# Patient Record
Sex: Male | Born: 1985 | Hispanic: No | Marital: Single | Smoking: Former smoker
Health system: Southern US, Community
[De-identification: ages and names within clinical notes are randomized; demographics above are authoritative.]

## PROBLEM LIST (undated history)

## (undated) DIAGNOSIS — Z9289 Personal history of other medical treatment: Secondary | ICD-10-CM

## (undated) DIAGNOSIS — G8220 Paraplegia, unspecified: Secondary | ICD-10-CM

## (undated) DIAGNOSIS — C7490 Malignant neoplasm of unspecified part of unspecified adrenal gland: Secondary | ICD-10-CM

## (undated) DIAGNOSIS — J189 Pneumonia, unspecified organism: Secondary | ICD-10-CM

## (undated) DIAGNOSIS — R32 Unspecified urinary incontinence: Secondary | ICD-10-CM

## (undated) DIAGNOSIS — L709 Acne, unspecified: Secondary | ICD-10-CM

## (undated) DIAGNOSIS — N319 Neuromuscular dysfunction of bladder, unspecified: Secondary | ICD-10-CM

## (undated) DIAGNOSIS — R000 Tachycardia, unspecified: Secondary | ICD-10-CM

## (undated) DIAGNOSIS — R159 Full incontinence of feces: Secondary | ICD-10-CM

## (undated) DIAGNOSIS — D649 Anemia, unspecified: Secondary | ICD-10-CM

## (undated) HISTORY — PX: SPINE SURGERY: SHX786

## (undated) HISTORY — DX: Neuromuscular dysfunction of bladder, unspecified: N31.9

## (undated) HISTORY — DX: Acne, unspecified: L70.9

## (undated) HISTORY — DX: Paraplegia, unspecified: G82.20

## (undated) HISTORY — DX: Malignant neoplasm of unspecified part of unspecified adrenal gland: C74.90

---
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
## 2005-10-05 ENCOUNTER — Ambulatory Visit: Payer: Self-pay | Admitting: Family Medicine

## 2007-04-07 ENCOUNTER — Encounter (INDEPENDENT_AMBULATORY_CARE_PROVIDER_SITE_OTHER): Payer: Self-pay | Admitting: Family Medicine

## 2007-04-08 ENCOUNTER — Ambulatory Visit: Payer: Self-pay | Admitting: Family Medicine

## 2007-04-08 DIAGNOSIS — G8220 Paraplegia, unspecified: Secondary | ICD-10-CM | POA: Insufficient documentation

## 2007-04-08 DIAGNOSIS — N319 Neuromuscular dysfunction of bladder, unspecified: Secondary | ICD-10-CM | POA: Insufficient documentation

## 2007-04-08 DIAGNOSIS — L732 Hidradenitis suppurativa: Secondary | ICD-10-CM | POA: Insufficient documentation

## 2007-04-08 DIAGNOSIS — C7490 Malignant neoplasm of unspecified part of unspecified adrenal gland: Secondary | ICD-10-CM | POA: Insufficient documentation

## 2007-04-08 DIAGNOSIS — J45909 Unspecified asthma, uncomplicated: Secondary | ICD-10-CM

## 2007-04-09 ENCOUNTER — Telehealth (INDEPENDENT_AMBULATORY_CARE_PROVIDER_SITE_OTHER): Payer: Self-pay | Admitting: *Deleted

## 2007-08-11 ENCOUNTER — Telehealth (INDEPENDENT_AMBULATORY_CARE_PROVIDER_SITE_OTHER): Payer: Self-pay | Admitting: *Deleted

## 2008-10-19 ENCOUNTER — Ambulatory Visit: Payer: Self-pay | Admitting: Internal Medicine

## 2009-11-02 ENCOUNTER — Ambulatory Visit: Payer: Self-pay | Admitting: Internal Medicine

## 2009-11-07 LAB — CONVERTED CEMR LAB
AST: 23 units/L (ref 0–37)
Albumin: 3.9 g/dL (ref 3.5–5.2)
Alkaline Phosphatase: 71 units/L (ref 39–117)
Basophils Absolute: 0 10*3/uL (ref 0.0–0.1)
Basophils Relative: 0.5 % (ref 0.0–3.0)
CO2: 25 meq/L (ref 19–32)
Calcium: 9.3 mg/dL (ref 8.4–10.5)
Chloride: 106 meq/L (ref 96–112)
Eosinophils Absolute: 0.1 10*3/uL (ref 0.0–0.7)
Glucose, Bld: 79 mg/dL (ref 70–99)
HCT: 43.1 % (ref 39.0–52.0)
Hemoglobin: 14.3 g/dL (ref 13.0–17.0)
Lymphocytes Relative: 25 % (ref 12.0–46.0)
Lymphs Abs: 2.1 10*3/uL (ref 0.7–4.0)
MCHC: 33.1 g/dL (ref 30.0–36.0)
Neutro Abs: 5.8 10*3/uL (ref 1.4–7.7)
Potassium: 4.3 meq/L (ref 3.5–5.1)
RBC: 5.2 M/uL (ref 4.22–5.81)
RDW: 13.9 % (ref 11.5–14.6)
Sodium: 137 meq/L (ref 135–145)
TSH: 1.39 microintl units/mL (ref 0.35–5.50)
Total Protein: 7.2 g/dL (ref 6.0–8.3)

## 2009-11-18 ENCOUNTER — Telehealth (INDEPENDENT_AMBULATORY_CARE_PROVIDER_SITE_OTHER): Payer: Self-pay | Admitting: *Deleted

## 2009-11-30 ENCOUNTER — Encounter: Payer: Self-pay | Admitting: Internal Medicine

## 2009-12-28 ENCOUNTER — Telehealth (INDEPENDENT_AMBULATORY_CARE_PROVIDER_SITE_OTHER): Payer: Self-pay | Admitting: *Deleted

## 2010-03-08 ENCOUNTER — Telehealth (INDEPENDENT_AMBULATORY_CARE_PROVIDER_SITE_OTHER): Payer: Self-pay | Admitting: *Deleted

## 2010-04-11 NOTE — Progress Notes (Signed)
Summary: QUESTION ABOUT [place] ACCESS AND W/C REPAIR  Phone Note From Other Clinic   Caller: [facility] Summary of Call: RECEIVED PAPERWORK FROM [facility] REGARDING WHEELCHAIR REPAIRS--PAPER REFERENCES HIS MEDICAID TO BE [place] ACCESS AND WE DONT PARTICIPATE WITH [place] ACCESS, ONLY MEDICAID----HE DOES HAVE BCBS AS PRIMARY  OUR VERIFICATION SYSTEM DOES LIST DR Clyda Greener AS DOCTOR FOR HIS [place] ACCESS  WILL THIS MAKE ANY DIFFERENCE FOR HIS WHEELCHAIR REPAIR??    WILL TAKE PAPERWORK TO Duwayne Heck

## 2010-04-11 NOTE — Assessment & Plan Note (Signed)
Summary: CPX //lch   Vital Signs:  Patient profile:   25 year old male Height:      63 inches Weight:      120 pounds BMI:     21.33 Pulse rate:   104 / minute Pulse rhythm:   regular BP sitting:   122 / 80  (left arm) Cuff size:   regular  Vitals Entered By: Army Fossa CMA (November 02, 2009 2:58 PM) CC: Pt states he is here for a "general check up"    History of Present Illness:  CPX denies any issues and problems other than needing a check up  Current Medications (verified): 1)  None  Allergies (verified): No Known Drug Allergies  Past History:  Past Medical History: PARAPLEGIA -- d/t a tumor T4 dx at age 81 months , had surgery , does not see neuro routinely  NEUROGENIC BLADDER , has no control, does occasionally cath  ASTHMA (asx x years)  Past Surgical History: spinal surgery at age 94 month and 25 y/o   Family History: colon ca--no prostate ca--no DM--F MI--no HTN--no high chol--no   Social History: Occupation:unemployed  finished college 2007, finish masters degree 07-2009, moved back to [place] from [place] Single lives at home w/  mom smokes marihuana, no tobacco Alcohol use-- socially  Drug use- no  Review of Systems ENT:  ST x few days . CV:  Denies chest pain or discomfort and swelling of feet. Resp:  Denies cough and shortness of breath. GI:  Denies abdominal pain, bloody stools, nausea, and vomiting. GU:  Denies dysuria and hematuria. Psych:  Denies anxiety and depression.  Physical Exam  General:  alert and well-developed.   Nose:  not congested  Mouth:  no red or d/c  Lungs:  normal respiratory effort, no intercostal retractions, no accessory muscle use, and normal breath sounds.   Heart:  normal rate, regular rhythm, no murmur, and no gallop.   Abdomen:  soft, non-tender, no distention, no masses, no guarding, and no rigidity.   Extremities:  trace edema at L ankle (no new per patient) Neurologic:  paraplegic, wheelchair bound. no  formal exam conducted    Impression & Recommendations:  Problem # 1:  ROUTINE GENERAL MEDICAL EXAM@HEALTH  CARE FACL (ICD-V70.0) Td today  diet-exercise discussed   Orders: Venipuncture (14782) TLB-BMP (Basic Metabolic Panel-BMET) (80048-METABOL) TLB-CBC Platelet - w/Differential (85025-CBCD) TLB-Hepatic/Liver Function Pnl (80076-HEPATIC) TLB-TSH (Thyroid Stimulating Hormone) (84443-TSH) Specimen Handling (95621) TLB-Cholesterol, Total (82465-CHO) Specimen Handling (30865)  Other Orders: Tdap => 45yrs IM (78469) Admin 1st Vaccine (62952) Admin 1st Vaccine [facility]) [phone])  Patient Instructions: 1)  Please schedule a follow-up appointment in 1 year.    Risk Factors:  Alcohol use:  yes    Tetanus/Td Vaccine    Vaccine Type: Tdap    Site: left deltoid    Mfr: GlaxoSmithKline    Dose: 0.5 ml    Route: IM    Given by: Army Fossa CMA    Exp. Date: 12/01/2011    Lot #: [id number]

## 2010-04-11 NOTE — Letter (Signed)
Summary: Handicapped Placard/NCD[facility]  Handicapped Placard/[facility]   Imported By: Lanelle Bal 11/09/2009 12:18:20  _____________________________________________________________________  External Attachment:    Type:   Image     Comment:   External Document

## 2010-04-11 NOTE — Letter (Signed)
Summary: CMN for Parts/CM&S  CMN for Parts/CM&S   Imported By: Lanelle Bal 12/08/2009 10:18:49  _____________________________________________________________________  External Attachment:    Type:   Image     Comment:   External Document

## 2010-04-11 NOTE — Progress Notes (Signed)
Summary: wheelchair repair  Phone Note Call from Patient   Caller: Mom Summary of Call: Pts mom called and states they need a rx sent saying that he needs his wheelchair reparied. Send rx to  [phone]. Faxed. Initial call taken by: Army Fossa CMA,  November 18, 2009 10:08 AM    New/Updated Medications: [facility] REPAIR repair pts wheelchair Prescriptions: [facility] REPAIR repair pts wheelchair  #1 x 0   Entered by:   Army Fossa CMA   Authorized by:   Nolon Rod. Paz MD   Signed by:   Army Fossa CMA on 11/18/2009   Method used:   Printed then faxed to ...         RxID:   [id number]

## 2010-04-13 NOTE — Progress Notes (Signed)
Summary: Pt fell out of wheelchair nad it his head  Phone Note Call from Patient   Caller: Mom Call For: Lee E. Paz MD Details for Reason: Pt fell and hit his head. Summary of Call: Call from mother who stated patient is in a wheel chair and he fell out and hit his head on the curb, she stated he had a cut up on his head and wanted him to be seen. I advised no appts avail, advised he may need to go the the hospt or urgent care for eval. she stated she would take him to the [facility] walk in facility on [address] since he is established there. I advised that would be great and to have them send over office notes, she voiced understanding. Call ended. Initial call taken by: Almeta Monas CMA Duncan Dull),  March 08, 2010 3:59 PM

## 2010-04-14 ENCOUNTER — Ambulatory Visit (INDEPENDENT_AMBULATORY_CARE_PROVIDER_SITE_OTHER): Payer: BC Managed Care – PPO | Admitting: Internal Medicine

## 2010-04-14 ENCOUNTER — Encounter: Payer: Self-pay | Admitting: Internal Medicine

## 2010-04-14 DIAGNOSIS — S20219A Contusion of unspecified front wall of thorax, initial encounter: Secondary | ICD-10-CM | POA: Insufficient documentation

## 2010-04-14 DIAGNOSIS — I739 Peripheral vascular disease, unspecified: Secondary | ICD-10-CM

## 2010-04-21 ENCOUNTER — Encounter: Payer: Self-pay | Admitting: Internal Medicine

## 2010-04-21 ENCOUNTER — Encounter (INDEPENDENT_AMBULATORY_CARE_PROVIDER_SITE_OTHER): Payer: BC Managed Care – PPO

## 2010-04-21 DIAGNOSIS — I739 Peripheral vascular disease, unspecified: Secondary | ICD-10-CM

## 2010-04-27 NOTE — Miscellaneous (Signed)
Summary: Orders Update  Clinical Lists Changes  Orders: Added new Test order of Arterial Duplex Lower Extremity (Arterial Duplex Low) - Signed 
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
## 2010-05-19 ENCOUNTER — Telehealth (INDEPENDENT_AMBULATORY_CARE_PROVIDER_SITE_OTHER): Payer: Self-pay | Admitting: *Deleted

## 2010-05-30 NOTE — Progress Notes (Signed)
Summary: rx for wheelchair  Phone Note Call from Patient   Caller: Mom Summary of Call: mom would like prescription for new wheelchair faxed to [facility] physical therapy. fax [phone] add diagnosis also Initial call taken by: Doristine Devoid CMA,  May 19, 2010 10:23 AM  Follow-up for Phone Call        ok send Rx, Dx PARAPLEGIA Follow-up by: Nolon Rod. Paz MD,  May 19, 2010 4:35 PM  Additional Follow-up for Phone Call Additional follow up Details #1::        prescription faxed.Marland KitchenDoristine Devoid CMA  May 22, 2010 8:51 AM     New/Updated Medications: WHEELCHAIR  MISC (MISC. DEVICES) dx: 344.1- Paraplegia Prescriptions: WHEELCHAIR  MISC (MISC. DEVICES) dx: 344.1- Paraplegia  #1 x 0   Entered by:   Doristine Devoid CMA   Authorized by:   Nolon Rod. Paz MD   Signed by:   Doristine Devoid CMA on 05/22/2010   Method used:   Printed then faxed to ...         RxID:   [id number]

## 2010-06-20 ENCOUNTER — Ambulatory Visit: Payer: BC Managed Care – PPO | Attending: Internal Medicine | Admitting: Physical Therapy

## 2010-06-20 DIAGNOSIS — G8220 Paraplegia, unspecified: Secondary | ICD-10-CM | POA: Insufficient documentation

## 2010-06-20 DIAGNOSIS — M6281 Muscle weakness (generalized): Secondary | ICD-10-CM | POA: Insufficient documentation

## 2010-06-20 DIAGNOSIS — IMO0001 Reserved for inherently not codable concepts without codable children: Secondary | ICD-10-CM | POA: Insufficient documentation
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
## 2010-08-02 ENCOUNTER — Ambulatory Visit (INDEPENDENT_AMBULATORY_CARE_PROVIDER_SITE_OTHER): Payer: BC Managed Care – PPO | Admitting: Internal Medicine

## 2010-08-02 ENCOUNTER — Encounter: Payer: Self-pay | Admitting: Internal Medicine

## 2010-08-02 VITALS — BP 124/88 | HR 60

## 2010-08-02 DIAGNOSIS — M79641 Pain in right hand: Secondary | ICD-10-CM | POA: Insufficient documentation

## 2010-08-02 DIAGNOSIS — M79609 Pain in unspecified limb: Secondary | ICD-10-CM

## 2010-08-02 NOTE — Assessment & Plan Note (Signed)
Right thumb pain for 2 weeks, exam essentially normal. We agreed to do an x-ray, if the x-ray is negative we'll observe for now. declined any pain medicine at this time.

## 2010-08-02 NOTE — Progress Notes (Signed)
  Subjective:    Patient ID: Lee Reilly, male    DOB: April 26, 1985, 25 y.o.   MRN: [id number]  HPI 2 weeks history of pain mostly at the base of the right thumb and a PIP. He feels like he bended "too far back" the thumb few days ago.  Past Medical History  Diagnosis Date  . Paraplegia     Tumor T4 @ age 51month, had surgery  . Neurogenic bladder     has no control, occasional cath  . Asthma    Past Surgical History  Procedure Date  . Spine surgery     51months and 8 years   History   Social History  . Marital Status: Single    Spouse Name: N/A    Number of Children: N/A  . Years of Education: N/A   Occupational History  . Not on file.   Social History Main Topics  . Smoking status: Former Games developer  . Smokeless tobacco: Not on file   Comment: Marihuana  . Alcohol Use: Yes     socially   . Drug Use: Yes     Smokes Marihuana  . Sexually Active: Not on file   Other Topics Concern  . Not on file   Social History Narrative   lives at home w/  [employer] college 2007, finish masters degree 07-2009, moved back to [place] from [place]     Review of Systems No locking or triggering phenomena. No swelling. No history of previous injury or surgery in that hand.    Objective:   Physical Exam Alert, oriented, in no apparent distress. Wrists normal bilaterally Left hand normal Right hand normal to inspection on palpation. Specifically the right thumb is not swollen, no deformity, range of motion is normal, and no trigger phenomena. Vascular exam of the hands normal.       Assessment & Plan:

## 2010-08-02 NOTE — Patient Instructions (Signed)
Call if you still hurt in 2-3 weeks

## 2010-08-04 ENCOUNTER — Ambulatory Visit (INDEPENDENT_AMBULATORY_CARE_PROVIDER_SITE_OTHER)
Admission: RE | Admit: 2010-08-04 | Discharge: 2010-08-04 | Disposition: A | Discharge status: Home / Self Care | Payer: BC Managed Care – PPO | Source: Ambulatory Visit | Attending: Internal Medicine | Admitting: Internal Medicine

## 2010-08-04 DIAGNOSIS — M79641 Pain in right hand: Secondary | ICD-10-CM

## 2010-08-04 DIAGNOSIS — M79609 Pain in unspecified limb: Secondary | ICD-10-CM

## 2010-08-04 IMAGING — CR DG HAND 2V*R*
2 series · 2 of 2 positions shown · non-contrast
Comparison: None.

CLINICAL DATA: Pain without trauma

RIGHT HAND - 2 VIEW

[view not recorded (1 of 2)]
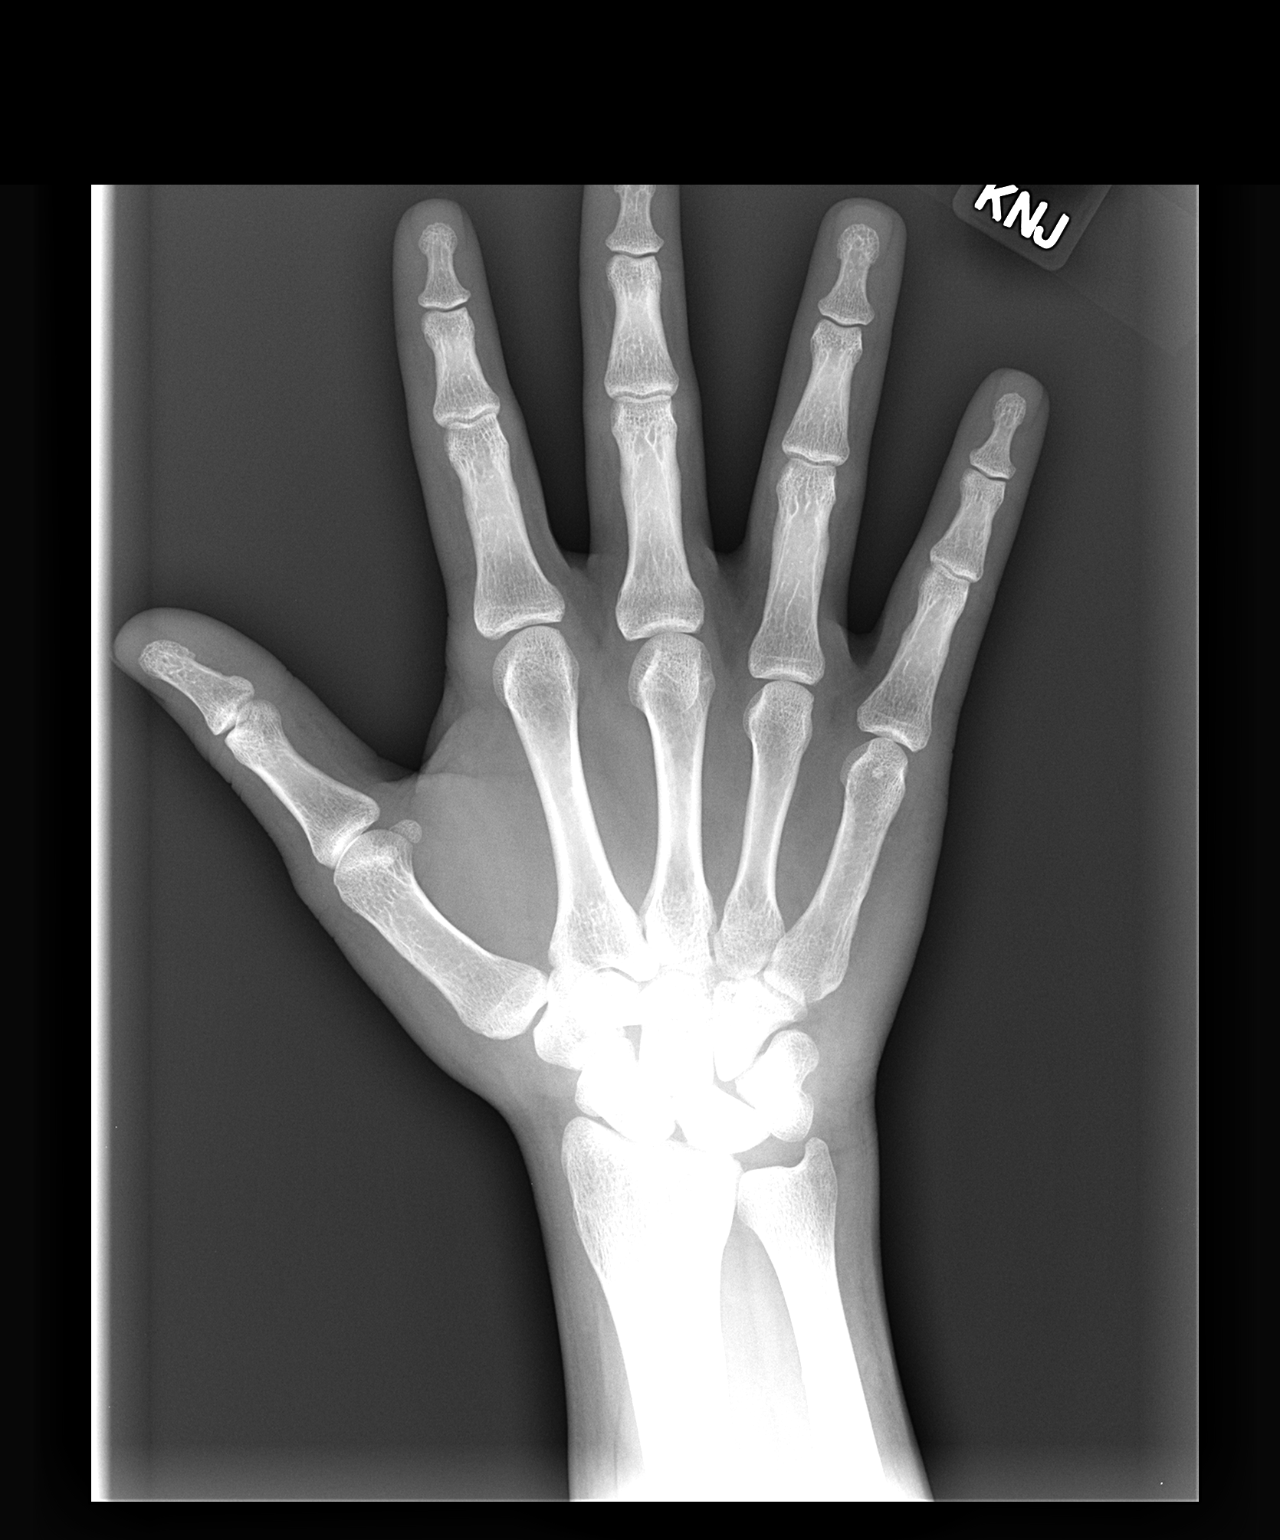

[view not recorded (2 of 2)]
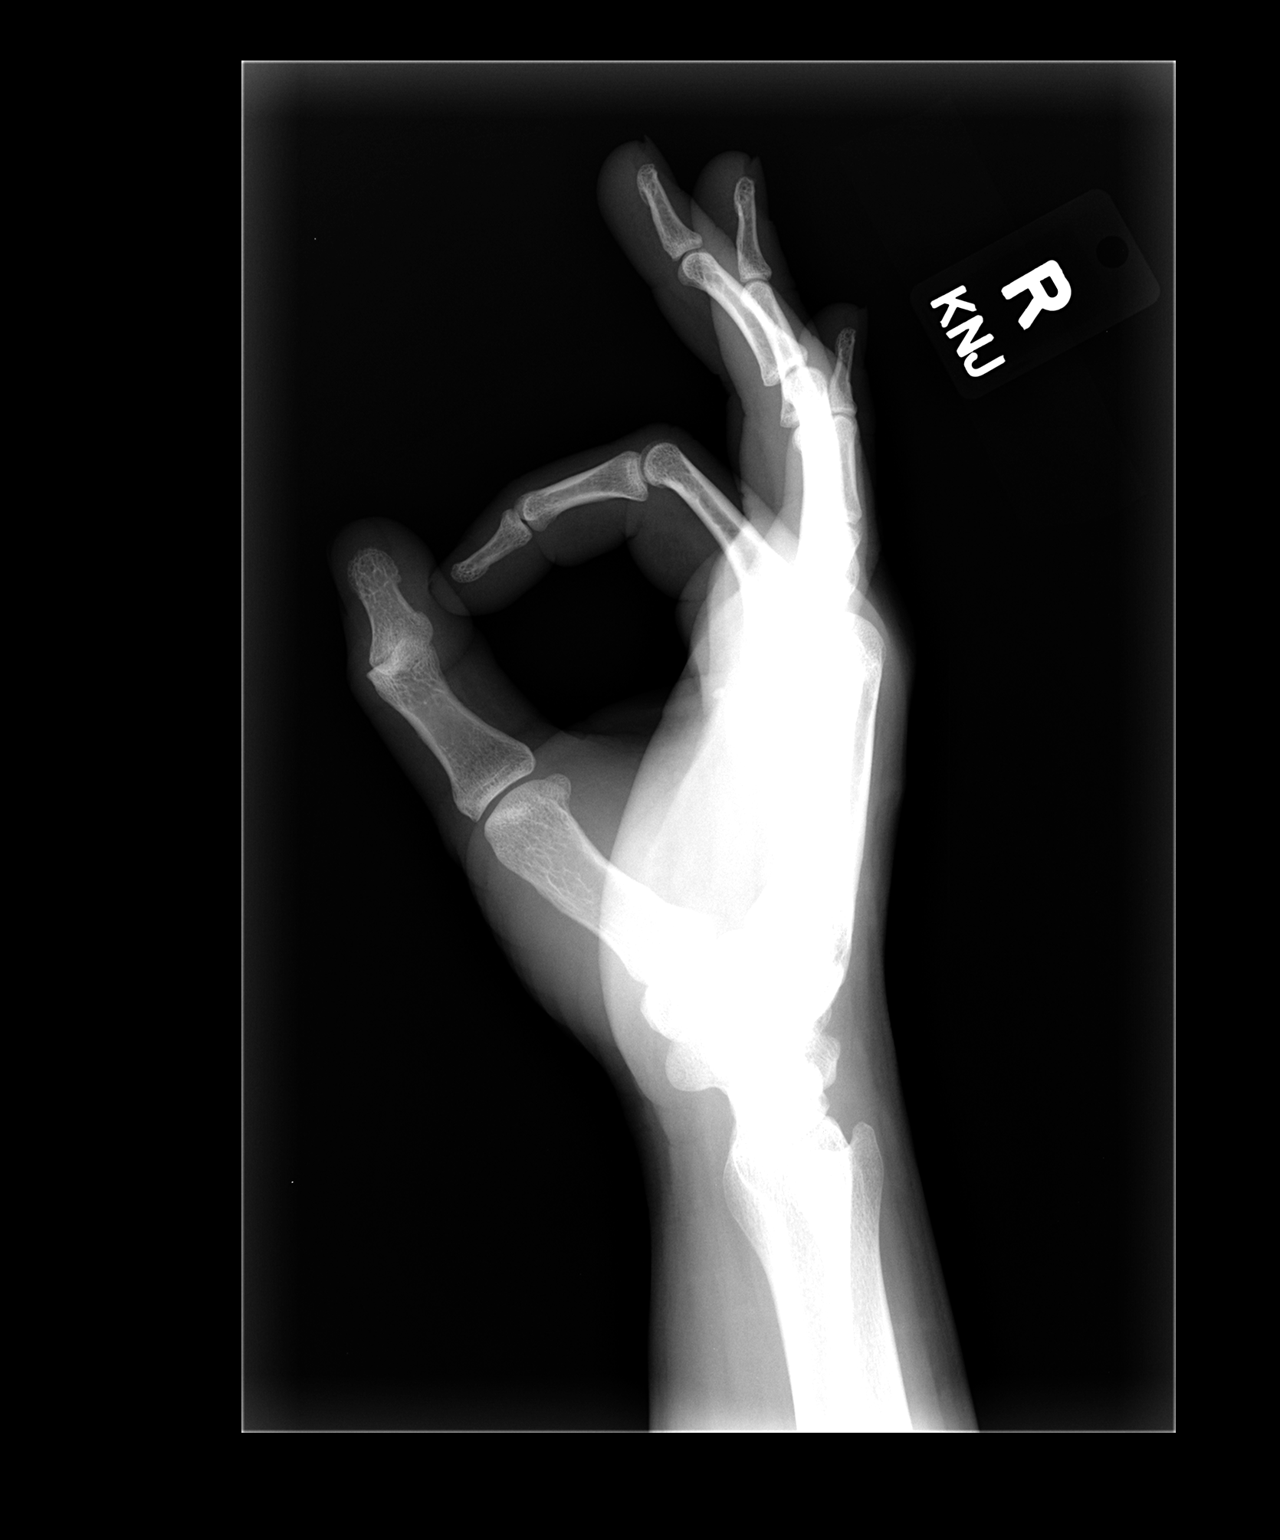

[2 of 2 positions shown; findings below may reference images not displayed]

FINDINGS: No radiodense foreign body. Negative for fracture,
dislocation, or other acute abnormality.  Normal alignment and
mineralization. No significant degenerative change.  Regional soft
tissues unremarkable.
IMPRESSION: Negative

## 2010-08-08 ENCOUNTER — Telehealth: Payer: Self-pay | Admitting: *Deleted

## 2010-08-08 NOTE — Telephone Encounter (Signed)
Message left for patient to return my call.  

## 2010-08-08 NOTE — Telephone Encounter (Signed)
Message copied by Leanne Lovely on Tue Aug 08, 2010 11:13 AM ------      Message from: Willow Ora E      Created: Mon Aug 07, 2010  1:14 PM       Advise patient:      XR negative

## 2010-08-09 NOTE — Telephone Encounter (Signed)
Message left for patient to return my call.  

## 2010-08-10 NOTE — Telephone Encounter (Signed)
Pt aware of results 

## 2010-08-15 ENCOUNTER — Ambulatory Visit: Payer: BC Managed Care – PPO | Admitting: Internal Medicine

## 2010-08-16 ENCOUNTER — Encounter: Payer: Self-pay | Admitting: Internal Medicine

## 2010-08-16 ENCOUNTER — Ambulatory Visit (INDEPENDENT_AMBULATORY_CARE_PROVIDER_SITE_OTHER): Payer: BC Managed Care – PPO | Admitting: Internal Medicine

## 2010-08-16 DIAGNOSIS — G8220 Paraplegia, unspecified: Secondary | ICD-10-CM

## 2010-08-16 DIAGNOSIS — M79609 Pain in unspecified limb: Secondary | ICD-10-CM

## 2010-08-16 DIAGNOSIS — M79641 Pain in right hand: Secondary | ICD-10-CM

## 2010-08-16 NOTE — Progress Notes (Signed)
  Subjective:    Patient ID: Lee Reilly, male    DOB: 1985/09/10, 25 y.o.   MRN: [id number]  HPI Needs a face-to-face evaluation and get a new wheelchair. see assessment and plan  Past Medical History  Diagnosis Date  . Paraplegia     Tumor T4 @ age 31month, had surgery  . Neurogenic bladder     has no control, occasional cath  . Asthma    Past Surgical History  Procedure Date  . Spine surgery     31months and 8 years   History   Social History  . Marital Status: Single    Spouse Name: N/A    Number of Children: N/A  . Years of Education: N/A   Occupational History  . Not on file.   Social History Main Topics  . Smoking status: Former Games developer  . Smokeless tobacco: Not on file   Comment: Marihuana  . Alcohol Use: Yes     socially   . Drug Use: Yes     Smokes Marihuana  . Sexually Active: Not on file   Other Topics Concern  . Not on file   Social History Narrative   lives at home w/  [employer] college 2007, finish masters degree 07-2009, moved back to [place] from [place]   Family History  Problem Relation Age of Onset  . Diabetes Father      Review of Systems His hand is still hurting, request an orthopedic referral    Objective:   Physical Exam  Constitutional: He is oriented to person, place, and time. He appears well-developed. No distress.  Cardiovascular: Normal rate, regular rhythm and normal heart sounds.   No murmur heard. Pulmonary/Chest: Effort normal and breath sounds normal. No respiratory distress. He has no wheezes. He has no rales.  Neurological: He is alert and oriented to person, place, and time.  Skin: He is not diaphoretic.  Psychiatric: He has a normal mood and affect. His behavior is normal. Judgment and thought content normal.          Assessment & Plan:

## 2010-08-16 NOTE — Assessment & Plan Note (Addendum)
25 year old gentleman with a history of paraplegia, needs a new wheelchair, current one is about 27 years old. He feels like his current model works well for him; he has not issues self propelling. Will send a copy of this visit to [facility] at the patient's request

## 2010-08-16 NOTE — Assessment & Plan Note (Signed)
X-ray was negative, he has persistent right hand pain. Refer to orthopedic surgery

## 2010-08-17 ENCOUNTER — Other Ambulatory Visit: Payer: Self-pay | Admitting: *Deleted

## 2010-08-17 MED ORDER — AMBULATORY NON FORMULARY MEDICATION: Status: DC

## 2010-09-11 ENCOUNTER — Ambulatory Visit (INDEPENDENT_AMBULATORY_CARE_PROVIDER_SITE_OTHER): Payer: BC Managed Care – PPO | Admitting: *Deleted

## 2010-09-11 DIAGNOSIS — Z111 Encounter for screening for respiratory tuberculosis: Secondary | ICD-10-CM

## 2010-09-11 DIAGNOSIS — Z23 Encounter for immunization: Secondary | ICD-10-CM

## 2010-09-11 DIAGNOSIS — Z0000 Encounter for general adult medical examination without abnormal findings: Secondary | ICD-10-CM

## 2010-09-12 LAB — HEPATITIS B SURFACE ANTIBODY,QUALITATIVE: Hep B S Ab: NEGATIVE

## 2010-09-12 LAB — RUBEOLA ANTIBODY IGG: Rubeola IgG: 2.71 {ISR} — ABNORMAL HIGH

## 2010-09-12 LAB — RUBEOLA ANTIBODY, IGM: Measles Ab IgM, IFA: <1:10 {titer}

## 2010-09-12 NOTE — Progress Notes (Signed)
  Subjective:    Patient ID: Lee Reilly, male    DOB: Jul 28, 1985, 25 y.o.   MRN: [id number]  HPI    Review of Systems     Objective:   Physical Exam        Assessment & Plan:

## 2010-09-14 ENCOUNTER — Ambulatory Visit (INDEPENDENT_AMBULATORY_CARE_PROVIDER_SITE_OTHER): Payer: BC Managed Care – PPO | Admitting: *Deleted

## 2010-09-14 DIAGNOSIS — Z23 Encounter for immunization: Secondary | ICD-10-CM

## 2010-11-17 ENCOUNTER — Telehealth: Payer: Self-pay | Admitting: Internal Medicine

## 2010-11-23 MED ORDER — AMBULATORY NON FORMULARY MEDICATION: Status: DC

## 2010-11-23 NOTE — Telephone Encounter (Signed)
Please call patients Mom when this is done.

## 2010-11-23 NOTE — Telephone Encounter (Signed)
Order was made for wheelchair to [facility] in error by MD. Corrected, printed & faxed new Rx to [facility]

## 2011-04-19 ENCOUNTER — Telehealth: Payer: Self-pay | Admitting: Internal Medicine

## 2011-04-19 NOTE — Telephone Encounter (Signed)
Patient's mother came into the office to request a letter of certification of disability for her son, Lee Reilly, from Dr. Drue Novel. Patient's mother states this is an urgent matter.

## 2011-04-20 ENCOUNTER — Telehealth: Payer: Self-pay | Admitting: *Deleted

## 2011-04-20 ENCOUNTER — Encounter: Payer: Self-pay | Admitting: Internal Medicine

## 2011-04-20 NOTE — Telephone Encounter (Signed)
Phoned pt's mother & left msg to inform her that pt's letter is ready for pick up at the front desk.

## 2011-04-20 NOTE — Telephone Encounter (Signed)
done

## 2011-04-20 NOTE — Telephone Encounter (Signed)
Please advise 

## 2011-06-04 ENCOUNTER — Telehealth: Payer: Self-pay | Admitting: *Deleted

## 2011-06-04 NOTE — Telephone Encounter (Signed)
[facility] Triage Call Report Triage Record Num: [id number] Operator: April Finney Patient Name: Lee Reilly Call Date & Time: 06/03/2011 8:20:27AM Patient Phone: [phone] PCP: Nolon Rod. Paz Patient Gender: Male PCP Fax : Patient DOB: May 26, 1985 Practice Name: [facility] Reason for Call: Caller: Teresa/Mother; PCP: Willow Ora; CB#: [phone]; Call regarding Cough/Congestion; Mom calling about cough that interferes with sleep and seems to be getting worse. Breathing seems faster but is resting now. Hx of asthma. No emergent symptoms. See in 4 hrs care advice given. Protocol(s) Used: Cough - Adult Recommended Outcome per Protocol: See Provider within 4 hours Reason for Outcome: New or worsening cough AND known cardiac or respiratory condition Care Advice: ~ Another adult should drive. ~ Call provider if symptoms worsen or new symptoms develop. ~ Avoid any activity that produces symptoms until evaluated by provider. Call EMS 911 if sudden worsening of breathing problem, dusky or blue/gray color of lips, fingernail beds or skin; chest pain, weakness, or confusion occurs. ~ Go to ED IMMEDIATELY if developing increased shortness of breath, continuous cough, worsening fatigue, or unable to perform ADLs. ~ ~ SYMPTOM / CONDITION MANAGEMENT ~ IMMEDIATE ACTION ~ CAUTIONS Most adults need to drink 6-10 eight-ounce glasses (1.2-2.0 liters) of fluids per day unless previously told to limit fluid intake for other medical reasons. Limit fluids that contain caffeine, sugar or alcohol. Urine will be a very light yellow color when you drink enough fluids. ~ 06/03/2011 8:48:18AM

## 2011-06-04 NOTE — Telephone Encounter (Signed)
If he needs to be seen within 4 hours, needs to go to the UC

## 2011-06-05 NOTE — Telephone Encounter (Signed)
Left message to call office yesterday and today.

## 2011-06-06 NOTE — Telephone Encounter (Signed)
Pt's mother called & stated that he is feeling much better.

## 2011-08-30 ENCOUNTER — Ambulatory Visit: Payer: BC Managed Care – PPO | Admitting: Internal Medicine

## 2011-09-05 ENCOUNTER — Ambulatory Visit (INDEPENDENT_AMBULATORY_CARE_PROVIDER_SITE_OTHER): Payer: BC Managed Care – PPO | Admitting: Internal Medicine

## 2011-09-05 ENCOUNTER — Encounter: Payer: Self-pay | Admitting: Internal Medicine

## 2011-09-05 VITALS — BP 124/72 | HR 76 | Temp 98.4°F

## 2011-09-05 DIAGNOSIS — G8220 Paraplegia, unspecified: Secondary | ICD-10-CM

## 2011-09-05 DIAGNOSIS — F1910 Other psychoactive substance abuse, uncomplicated: Secondary | ICD-10-CM | POA: Insufficient documentation

## 2011-09-05 NOTE — Assessment & Plan Note (Addendum)
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 We agreed that he'll go back to self catheterization 4 times a day Also needs to go back to daily glycerin suppositories to help his bowels. From my side, I will check with some colleagues and see if they have any suggestions ref bowel incontinence. (did discuss informally w/ GI , there is several modalities of treatment such as enemas, laxatives, nothing that the pt does not know)

## 2011-09-05 NOTE — Assessment & Plan Note (Addendum)
Patient that smokes tobacco marijuana and drinks alcohol. I counseled against that behavior. He is  concerned about the ticket he got for DWI, his license was removed, under his lawyers advise he likes me to write him a note stating that he has a decreased lung capacity --which I don't think he does-- so he can "get off the ticket" ; i refused to write him such note. Requests a pulmonology referral.

## 2011-09-05 NOTE — Progress Notes (Signed)
  Subjective:    Patient ID: Lee Reilly, male    DOB: [birth year], 26 y.o.   MRN: [id number]  HPI Here with the following concerns: Has a history of urinary and bowel incontinence, has not been taking care of those problems appropriately and requeste some advice. Also his license was revoked due to a DWI, under his lawyers advise, he likes me to state that he has decreased lung capacity so he can get off the ticket . Few days ago, the patient's mother visit with me, she is my patient, I saw her with depression. She reports that [name] is going through some depression, is abusing alcohol and drugs. Today the patient reports no depression, he stated that he is motivated and actually looking for a job. He admits to smoking tobacco occasionally, drink 3 or 4 times a week, and smokes marijuana. Admits  the day he  got the DWI he was drinking, did not say how much  Past Medical History  Diagnosis Date  . Paraplegia     Tumor T4 @ age 59month, had surgery  . Neurogenic bladder     has no control, occasional cath  . Asthma    Past Surgical History  Procedure Date  . Spine surgery     59months and 8 years     Review of Systems Denies suicidal ideas. No cough or shortness or breath     Objective:   Physical Exam  Alert oriented x3, no apparent distress, wheelchair bound. Lungs clear to touch bilaterally Cardiovascular records and rhythm without a murmur      Assessment & Plan:

## 2011-09-05 NOTE — Patient Instructions (Addendum)
Will call you about the bowel rehab

## 2011-09-06 ENCOUNTER — Encounter: Payer: Self-pay | Admitting: Internal Medicine

## 2011-09-07 ENCOUNTER — Telehealth: Payer: Self-pay | Admitting: Internal Medicine

## 2011-09-07 NOTE — Telephone Encounter (Signed)
thx

## 2011-09-07 NOTE — Telephone Encounter (Signed)
I called patient to inform him of his pulmonary appointment, and patient states "I have all that under control".  I asked him what did he mean, and patient states he does not need the appointment & asked me to cancel, so I did.

## 2011-09-10 ENCOUNTER — Telehealth: Payer: Self-pay | Admitting: Internal Medicine

## 2011-09-10 NOTE — Telephone Encounter (Signed)
Please advise 

## 2011-09-10 NOTE — Telephone Encounter (Signed)
LMOVM

## 2011-09-10 NOTE — Telephone Encounter (Signed)
If we have on file a release to let me talk with her  I will talk to her, let me know if you find one. If no release, I can't call her back unless she gets one

## 2011-09-10 NOTE — Telephone Encounter (Signed)
Pts mother called stating she would like to talk to Dr. Drue Novel regarding her son's last appt.

## 2011-10-09 ENCOUNTER — Institutional Professional Consult (permissible substitution): Payer: BC Managed Care – PPO | Admitting: Internal Medicine

## 2011-11-02 ENCOUNTER — Telehealth: Payer: Self-pay | Admitting: Internal Medicine

## 2011-11-02 DIAGNOSIS — R32 Unspecified urinary incontinence: Secondary | ICD-10-CM

## 2011-11-02 NOTE — Telephone Encounter (Signed)
Mother, Derreck Wiltsey, called previously 10/24/11 about physical therapy for [place] because he is applying for jobs and needs assistance with bladder control.  Further research in file, incomplete HIPAA signed.  Left message on cell # for Mickle to call, without response.  Dr. Drue Novel out of office so reviewed upon return 11/01/11.  Advised same to mother via phone 5:40 p.m.  Talked with Keen, confirmed patient id, he gave verbal approval for mother to have access to his health care information.  He confirmed he does want a referral to physical therapist to assist with bladder control.  I advised he must come into the office sometime today 11/02/11 or Monday and complete the HIPAA form with the receptionist.  Researched and Eulis Foster, PT, [facility] at [facility] location does work with patients for bladder and incontinence; however, needs to talk with Dr. Drue Novel or Lillia Abed before referral to answer brief questions related to his level of muscle usage.  [phone] is her office number.

## 2011-11-02 NOTE — Telephone Encounter (Signed)
Called PT , they are closed this PM, will try again Monday

## 2011-11-08 NOTE — Telephone Encounter (Signed)
Caller: Fields/Patient; Patient Name: Lee Reilly; PCP: Willow Ora; Best Callback Phone Number: [phone]. Pt. requesting a referral to a rehab for his urinary incontinence which he has had difficulty with "most of my life".  Pt.s history, meds, and allergies reviewed in EPIC.  Last note in EPIC reveals that there was an attempt to contact PT on 11/02/11, it was closed and Clinical research associate wrote that he would try again on Monday 11/05/11.  No further entries noted.  This note will be forwarded to nursing pool in [facility].   CAN/db

## 2011-11-09 NOTE — Telephone Encounter (Signed)
I also called 11/06/2011, they were closed for lunch. Call today Friday, they already closed at noon. Unable to communicate with PT,  Plan:  Arrange referral to see Lee Reilly, PT at [facility] Dx bladder incontinence

## 2011-11-09 NOTE — Telephone Encounter (Signed)
Referral entered  

## 2011-11-30 ENCOUNTER — Ambulatory Visit: Payer: BC Managed Care – PPO | Admitting: Physical Therapy

## 2011-12-05 ENCOUNTER — Ambulatory Visit: Payer: BC Managed Care – PPO | Admitting: Physical Therapy

## 2011-12-11 ENCOUNTER — Ambulatory Visit: Payer: BC Managed Care – PPO | Admitting: Physical Therapy

## 2011-12-24 ENCOUNTER — Telehealth: Payer: Self-pay | Admitting: Internal Medicine

## 2011-12-24 NOTE — Telephone Encounter (Signed)
Pts mother left message on triage line stating she would like Dr. Drue Novel to write rx for pt to have wheelchair seat cushion. She will come pick it up. Call mobile #.

## 2011-12-24 NOTE — Telephone Encounter (Signed)
Please advise 

## 2011-12-25 ENCOUNTER — Telehealth: Payer: Self-pay | Admitting: Internal Medicine

## 2011-12-25 MED ORDER — WHEELCHAIR CUSHION MISC: Status: DC

## 2011-12-25 NOTE — Telephone Encounter (Signed)
Caller: [name]/Mother; Patient Name: Lee Reilly; PCP: Lelon Perla.; Best Callback Phone Number: [phone].  Relayed message to caller RX is ready.  No triage.

## 2011-12-25 NOTE — Telephone Encounter (Signed)
Pt notifed prescription is ready to be picked up at front desk.

## 2011-12-25 NOTE — Telephone Encounter (Signed)
Ok, please do.

## 2013-03-12 HISTORY — PX: FOOT SURGERY: SHX648

## 2013-03-12 HISTORY — PX: OTHER SURGICAL HISTORY: SHX169

## 2013-04-16 ENCOUNTER — Telehealth: Payer: Self-pay | Admitting: *Deleted

## 2013-04-16 NOTE — Telephone Encounter (Signed)
04/16/2013 received Prior Approval CMN/PA from [facility] sent to [facility], with attached charge slip.cm

## 2013-04-20 NOTE — Telephone Encounter (Addendum)
Patient has not been seen in this office since June 2013 LM on VM to schedule an appt/call back.

## 2013-05-01 ENCOUNTER — Encounter: Payer: Self-pay | Admitting: Internal Medicine

## 2013-05-01 ENCOUNTER — Ambulatory Visit (INDEPENDENT_AMBULATORY_CARE_PROVIDER_SITE_OTHER): Payer: BC Managed Care – PPO | Admitting: Internal Medicine

## 2013-05-01 VITALS — BP 120/80 | HR 90 | Temp 98.0°F

## 2013-05-01 DIAGNOSIS — N319 Neuromuscular dysfunction of bladder, unspecified: Secondary | ICD-10-CM

## 2013-05-01 DIAGNOSIS — L709 Acne, unspecified: Secondary | ICD-10-CM | POA: Insufficient documentation

## 2013-05-01 DIAGNOSIS — Z23 Encounter for immunization: Secondary | ICD-10-CM

## 2013-05-01 DIAGNOSIS — Z0000 Encounter for general adult medical examination without abnormal findings: Secondary | ICD-10-CM | POA: Insufficient documentation

## 2013-05-01 DIAGNOSIS — G8220 Paraplegia, unspecified: Secondary | ICD-10-CM

## 2013-05-01 HISTORY — DX: Acne, unspecified: L70.9

## 2013-05-01 LAB — CBC WITH DIFFERENTIAL/PLATELET
Basophils Absolute: 0 10*3/uL (ref 0.0–0.1)
Basophils Relative: 0 % (ref 0–1)
Eosinophils Absolute: 0.4 10*3/uL (ref 0.0–0.7)
Eosinophils Relative: 4 % (ref 0–5)
HCT: 40.2 % (ref 39.0–52.0)
Hemoglobin: 13.4 g/dL (ref 13.0–17.0)
LYMPHS ABS: 1.6 10*3/uL (ref 0.7–4.0)
LYMPHS PCT: 18 % (ref 12–46)
MCH: 26.1 pg (ref 26.0–34.0)
MCHC: 33.3 g/dL (ref 30.0–36.0)
MCV: 78.4 fL (ref 78.0–100.0)
Monocytes Absolute: 0.7 10*3/uL (ref 0.1–1.0)
Monocytes Relative: 8 % (ref 3–12)
NEUTROS ABS: 6.2 10*3/uL (ref 1.7–7.7)
Neutrophils Relative %: 70 % (ref 43–77)
PLATELETS: 364 10*3/uL (ref 150–400)
RBC: 5.13 MIL/uL (ref 4.22–5.81)
RDW: 14.6 % (ref 11.5–15.5)
WBC: 8.9 10*3/uL (ref 4.0–10.5)

## 2013-05-01 MED ORDER — CLINDAMYCIN PHOSPHATE 1 % EX GEL: Freq: Two times a day (BID) | CUTANEOUS | Status: DC

## 2013-05-01 NOTE — Progress Notes (Signed)
   Subjective:    Patient ID: Lee Reilly, male    DOB: [birth year], 28 y.o.   MRN: [id number]  DOS:  05/01/2013  CPX, Doing well, needs paper work Also would like treatment for acne    ROS Diet-- healthy most of the time  No fever, chills  No  CP, SOB  Denies  nausea, vomiting diarrhea Denies  blood in the stools (-) cough, sputum production  No  gross hematuria  No anxiety, depression  Past Medical History  Diagnosis Date  . Paraplegia     Tumor T4 @ age 36month, had surgery  . Neurogenic bladder     has no control, occasional cath  . Asthma   . Acne 05/01/2013    Past Surgical History  Procedure Laterality Date  . Spine surgery      33months and 8 years    History   Social History  . Marital Status: Single    Spouse Name: N/A    Number of Children: 0  . Years of Education: N/A   Occupational History  . unemployed     Social History Main Topics  . Smoking status: Current Some Day Smoker  . Smokeless tobacco: Never Used     Comment: << 1 ppd   . Alcohol Use: Yes     Comment: socially   . Drug Use: Yes     Comment: Smokes Marihuana  . Sexual Activity: Not on file   Other Topics Concern  . Not on file   Social History Narrative   lives by himself    finished college 2007, finish masters degree 07-2009, moved back to [place] from [place]        Medication List       This list is accurate as of: 05/01/13 11:59 PM.  Always use your most recent med list.               [place] for Wheelchair.     clindamycin 1 % gel  Commonly known as:  CLINDAGEL  Apply topically 2 (two) times daily.     Wheelchair [employer]  Use as directed with wheelchair.           Objective:   Physical Exam BP 120/80  Pulse 90  Temp(Src) 98 F (36.7 C)  SpO2 100% General -- alert, well-developed, NAD, wheelchair bound .  HEENT-- Not pale.  Several white and black heads (acne) @ face, 1-2 small pustules  Lungs  -- normal respiratory effort, no intercostal retractions, no accessory muscle use, and normal breath sounds.  Heart-- normal rate, regular rhythm, no murmur.  Abdomen-- Not distended,soft, non-tender.  Extremities-- no pretibial edema bilaterally  Neurologic--  alert & oriented X3. Speech normal. Psych-- Cognition and judgment appear intact. Cooperative with normal attention span and concentration. No anxious or depressed appearing.     Assessment & Plan:

## 2013-05-01 NOTE — Assessment & Plan Note (Signed)
Reports no problems with self-catheterization

## 2013-05-01 NOTE — Progress Notes (Signed)
Pre visit review using our clinic review tool, if applicable. No additional management support is needed unless otherwise documented below in the visit note. 

## 2013-05-01 NOTE — Assessment & Plan Note (Signed)
History of acne, request a referral to dermatology. I recommend a trial Clindagel first, referral if not improving

## 2013-05-01 NOTE — Patient Instructions (Signed)
Get your blood work before you leave   Next visit is for a physical exam in 1 year, fasting Please make an appointment    

## 2013-05-01 NOTE — Assessment & Plan Note (Signed)
Td 2011 Declined flu shot  PNM shot today Never had a cscope Labs

## 2013-05-01 NOTE — Assessment & Plan Note (Addendum)
Needs paperwork for a wheelchair, will contact his mother who has all the information needed. Reports no problem with pressure ulcers

## 2013-05-02 LAB — TSH: TSH: 1.689 u[IU]/mL (ref 0.350–4.500)

## 2013-05-02 LAB — COMPREHENSIVE METABOLIC PANEL
ALBUMIN: 4 g/dL (ref 3.5–5.2)
ALT: 11 U/L (ref 0–53)
AST: 15 U/L (ref 0–37)
Alkaline Phosphatase: 67 U/L (ref 39–117)
BUN: 9 mg/dL (ref 6–23)
CALCIUM: 9.1 mg/dL (ref 8.4–10.5)
CO2: 28 meq/L (ref 19–32)
Chloride: 105 mEq/L (ref 96–112)
Creat: 0.37 mg/dL — ABNORMAL LOW (ref 0.50–1.35)
Glucose, Bld: 82 mg/dL (ref 70–99)
Potassium: 3.8 mEq/L (ref 3.5–5.3)
SODIUM: 140 meq/L (ref 135–145)
TOTAL PROTEIN: 7.5 g/dL (ref 6.0–8.3)
Total Bilirubin: 0.6 mg/dL (ref 0.2–1.2)

## 2013-05-02 LAB — LIPID PANEL
Cholesterol: 144 mg/dL (ref 0–200)
HDL: 49 mg/dL (ref >39)
LDL Cholesterol: 84 mg/dL (ref 0–99)
Total CHOL/HDL Ratio: 2.9 Ratio
Triglycerides: 57 mg/dL (ref <150)
VLDL: 11 mg/dL (ref 0–40)

## 2013-05-03 ENCOUNTER — Encounter: Payer: Self-pay | Admitting: Internal Medicine

## 2013-05-04 ENCOUNTER — Telehealth: Payer: Self-pay | Admitting: Internal Medicine

## 2013-05-04 NOTE — Telephone Encounter (Signed)
Relevant patient education mailed to patient.  

## 2013-05-06 ENCOUNTER — Encounter: Payer: Self-pay | Admitting: *Deleted

## 2013-05-06 ENCOUNTER — Telehealth: Payer: Self-pay | Admitting: *Deleted

## 2013-05-06 NOTE — Telephone Encounter (Signed)
Patient mother called and stated that they dropped some forms off during his visit on last Friday. Patient mother is wondering did it ever get faxed. Please advise.

## 2013-05-06 NOTE — Telephone Encounter (Signed)
Motion form regarding wheelchair faxed 05/06/13

## 2013-05-06 NOTE — Telephone Encounter (Signed)
Ready to be faxed.

## 2013-07-07 ENCOUNTER — Telehealth: Payer: Self-pay | Admitting: *Deleted

## 2013-07-07 NOTE — Telephone Encounter (Signed)
aplication for disability parking placard completed and given to pts mother.

## 2013-08-07 ENCOUNTER — Telehealth: Payer: Self-pay | Admitting: Internal Medicine

## 2013-08-07 DIAGNOSIS — N319 Neuromuscular dysfunction of bladder, unspecified: Secondary | ICD-10-CM

## 2013-08-07 NOTE — Telephone Encounter (Signed)
Okay to place referrals? 

## 2013-08-07 NOTE — Telephone Encounter (Signed)
Caller name: [name] Relation to pt: mother Call back [phone]   Reason for call:  Pt's mother is wanting to get 2 referrals for pt;  1) to counseling.  Pt's mom states that pt is going thru depression  2) to a urologist.  Pt's mom had one in mind.  [phone]  Dr. Verdia Kuba Urologist.

## 2013-08-10 NOTE — Telephone Encounter (Signed)
Okay to place a referral to a urologist----DX neurogenic bladder As far as a  counselor, provide a list of counselors available in the area, they prefer the pt to set up the appointment

## 2013-08-12 NOTE — Telephone Encounter (Signed)
[place], I did not include the name of the urologist in the referral but see the previous note to see the urologist she prefers

## 2013-08-12 NOTE — Telephone Encounter (Signed)
Referral to urology placed and letter with list of counselors sent to pts mother. Pts mother made aware

## 2013-08-28 ENCOUNTER — Telehealth: Payer: Self-pay | Admitting: *Deleted

## 2013-08-28 NOTE — Telephone Encounter (Signed)
Patient's mother came into office today and requested a permanent disability parking placard. Form filled out and signed by Dr. Larose Kells. Copy made for scanning and original given to patient's mother. JG//CMA

## 2013-09-07 ENCOUNTER — Telehealth: Payer: Self-pay | Admitting: Internal Medicine

## 2013-09-07 NOTE — Telephone Encounter (Signed)
Caller name: Thresamma Relation to pt: mother Call back number: [phone] Pharmacy:  Reason for call: Patient's mother called to speak with Dr Larose Kells about her son seeing someone for depression and to help with bowel incontinence. Please call after 3:00pm.  Please advise

## 2013-09-08 NOTE — Telephone Encounter (Signed)
Please verify that we have a consent signed to be able to talk w/  the patient's mother otherwise discussed directly with the patient: As far as depression, recommend to see a counselor. As far as bowel incontinence, if he is having skin issues needs to come back and be seen

## 2013-09-16 NOTE — Telephone Encounter (Signed)
lmovm

## 2013-10-13 ENCOUNTER — Encounter: Payer: Self-pay | Admitting: Internal Medicine

## 2013-10-13 ENCOUNTER — Ambulatory Visit (INDEPENDENT_AMBULATORY_CARE_PROVIDER_SITE_OTHER): Payer: BC Managed Care – PPO | Admitting: Internal Medicine

## 2013-10-13 VITALS — BP 117/79 | HR 80 | Temp 97.9°F

## 2013-10-13 DIAGNOSIS — L259 Unspecified contact dermatitis, unspecified cause: Secondary | ICD-10-CM

## 2013-10-13 DIAGNOSIS — L309 Dermatitis, unspecified: Secondary | ICD-10-CM

## 2013-10-13 DIAGNOSIS — G8220 Paraplegia, unspecified: Secondary | ICD-10-CM

## 2013-10-13 MED ORDER — HYDROCORTISONE 2.5 % EX CREA: TOPICAL_CREAM | Freq: Two times a day (BID) | CUTANEOUS | Status: DC

## 2013-10-13 NOTE — Patient Instructions (Signed)
Please contact bDr. Charlett Blake office for an appointment   [phone]

## 2013-10-13 NOTE — Progress Notes (Signed)
Pre visit review using our clinic review tool, if applicable. No additional management support is needed unless otherwise documented below in the visit note. 

## 2013-10-13 NOTE — Progress Notes (Signed)
   Subjective:    Patient ID: Lee Reilly, male    DOB: 06-14-1985, 28 y.o.   MRN: [id number]  DOS:  10/13/2013 Type of visit - description: routine  History: The patient's main concern is improve his bladder and bowel care. It is inconvenient when he gets out off his house for a while to self catheterize his bladder . Also wonders if there is a better way to handle his BMs.    ROS Denies skin breakdowns at the buttocks Strongly denies depression, anxiety   Reports he drinks socially only twice a week, no heavy drinking. Denies nausea, vomiting, diarrhea or blood in the stools. Has a rash on the right elbow  Past Medical History  Diagnosis Date  . Paraplegia     Tumor T4 @ age 68month, had surgery  . Neurogenic bladder     has no control, occasional cath  . Asthma   . Acne 05/01/2013    Past Surgical History  Procedure Laterality Date  . Spine surgery      97months and 8 years    History   Social History  . Marital Status: Single    Spouse Name: N/A    Number of Children: 0  . Years of Education: N/A   Occupational History  . unemployed     Social History Main Topics  . Smoking status: Current Some Day Smoker  . Smokeless tobacco: Never Used     Comment: << 1 ppd   . Alcohol Use: Yes     Comment: socially   . Drug Use: Yes     Comment: Smokes Marihuana  . Sexual Activity: Not on file   Other Topics Concern  . Not on file   Social History Narrative   lives by himself    finished college 2007, finish masters degree 07-2009, moved back to [place] from [place]        Medication List       This list is accurate as of: 10/13/13  9:54 AM.  Always use your most recent med list.               [place] for Wheelchair.     clindamycin 1 % gel  Commonly known as:  CLINDAGEL  Apply topically 2 (two) times daily.     Wheelchair [employer]  Use as directed with wheelchair.           Objective:   Physical  Exam BP 117/79  Pulse 80  Temp(Src) 97.9 F (36.6 C)  SpO2 100%  General -- alert, well-developed, NAD.  Skin-- Skin at the flexor area of the right elbow is slightly dry and  Red, no warm Psych-- Cognition and judgment appear intact. Cooperative with normal attention span and concentration. No anxious or depressed appearing.       Assessment & Plan:     Eczema, start hydrocortisone 2.5% twice a day  He is moving to [place] soon for  A new job, reports that that is very good move for him.  He does not like to share his chart with his mother, will make a notation about that

## 2013-10-13 NOTE — Assessment & Plan Note (Signed)
Paraplegia, I don't have many suggestions to improve his incontinence management except maybe use a condom catheter. I suggested referral to a rehabilitation medication, they may have more suggestions, he prefers to contact them directly, I suggested Dr. Alysia Penna. See instructions.

## 2013-12-03 ENCOUNTER — Telehealth: Payer: Self-pay | Admitting: Internal Medicine

## 2013-12-03 NOTE — Telephone Encounter (Signed)
Caller name:[employer] Relation to OE:VOJJ Call back [phone] Pharmacy:  Reason for call: [employer] needing an order for a cushion for pt's wheelchair. Also would like to know if there is a ICD-10 code for the dx available.

## 2013-12-03 NOTE — Telephone Encounter (Signed)
LMOM for [employer] the order for Pts wheelchair cushion, ICD-10 code for paraplegia: G82.20.

## 2013-12-11 NOTE — Telephone Encounter (Signed)
Received fax from [employer] requesting authorization of cushion for wheelchair, authorized by Dr. Larose Kells and faxed back to [employer]. Forms placed in folder to be scanned.

## 2014-01-01 NOTE — Telephone Encounter (Signed)
Awaiting fax.

## 2014-01-01 NOTE — Telephone Encounter (Signed)
Brooke calling from [employer] states that dob was wrong and sent new copies for dr. Larose Kells to sign. [place] phone # [phone]

## 2014-01-22 NOTE — Telephone Encounter (Signed)
Orders faxed to [employer] at [phone]. Confirmation received. JG//CMA

## 2014-04-14 ENCOUNTER — Telehealth: Payer: Self-pay | Admitting: *Deleted

## 2014-04-14 NOTE — Telephone Encounter (Signed)
Received order via fax for wheelchair cushion from [employer]. Forwarded to Dr. Larose Kells. JG//CMA

## 2014-08-10 ENCOUNTER — Telehealth: Payer: Self-pay | Admitting: Internal Medicine

## 2014-08-10 DIAGNOSIS — R159 Full incontinence of feces: Secondary | ICD-10-CM

## 2014-08-10 DIAGNOSIS — G8220 Paraplegia, unspecified: Secondary | ICD-10-CM

## 2014-08-10 DIAGNOSIS — R32 Unspecified urinary incontinence: Secondary | ICD-10-CM

## 2014-08-10 NOTE — Telephone Encounter (Signed)
Caller name: Mrs Grainger  Relation to HK:ISNGXE  Call back number: [phone]   Reason for call: pt requesting a referral to [facility] due to bladder and bowel control. Mother is also requesting counseling for pt due to issues and concerns. Please advise.

## 2014-08-10 NOTE — Telephone Encounter (Signed)
Please advise 

## 2014-08-11 NOTE — Telephone Encounter (Signed)
Ok to refer.

## 2014-08-11 NOTE — Telephone Encounter (Signed)
1. Please get more specifics regards referral to [facility]", PT? OT? 2. Mail a list of counselor

## 2014-08-11 NOTE — Telephone Encounter (Signed)
Referral placed to PT regarding Pt's urinary and bowel incontinence, and paraplegia.

## 2014-08-11 NOTE — Telephone Encounter (Signed)
Pt has previously used PT for bladder incontinence. Pt's mother is requesting to see "the same people" which I'm guessing is PT.

## 2014-08-25 ENCOUNTER — Ambulatory Visit: Payer: BC Managed Care – PPO | Admitting: Physical Therapy

## 2014-08-25 ENCOUNTER — Ambulatory Visit: Payer: BC Managed Care – PPO

## 2014-09-02 ENCOUNTER — Ambulatory Visit: Payer: BLUE CROSS/BLUE SHIELD | Attending: Internal Medicine | Admitting: Physical Therapy

## 2014-09-02 ENCOUNTER — Encounter: Payer: Self-pay | Admitting: Physical Therapy

## 2014-09-02 DIAGNOSIS — G8220 Paraplegia, unspecified: Secondary | ICD-10-CM | POA: Insufficient documentation

## 2014-09-02 DIAGNOSIS — R32 Unspecified urinary incontinence: Secondary | ICD-10-CM | POA: Diagnosis not present

## 2014-09-02 DIAGNOSIS — M6289 Other specified disorders of muscle: Secondary | ICD-10-CM

## 2014-09-02 DIAGNOSIS — R159 Full incontinence of feces: Secondary | ICD-10-CM | POA: Diagnosis not present

## 2014-09-02 DIAGNOSIS — M6258 Muscle wasting and atrophy, not elsewhere classified, other site: Secondary | ICD-10-CM | POA: Diagnosis not present

## 2014-09-02 NOTE — Therapy (Signed)
[facility] [address], [place], [place], [address] Phone: [phone]   Fax:  [phone]  Physical Therapy Evaluation  Patient Details  Name: Lee Reilly MRN: [id number] Date of Birth: November 02, 1985 Referring Provider:  Colon Branch, MD  Encounter Date: 09/02/2014      PT End of Session - 09/02/14 1131    Visit Number 1   PT Start Time 1100   PT Stop Time 8563   PT Time Calculation (min) 31 min   Activity Tolerance Patient tolerated treatment well   Behavior During Therapy [facility] for tasks assessed/performed      Past Medical History  Diagnosis Date  . Paraplegia     Tumor T4 @ age 90month, had surgery  . Neurogenic bladder     has no control, occasional cath  . Asthma   . Acne 05/01/2013    Past Surgical History  Procedure Laterality Date  . Spine surgery      37months and 8 years    There were no vitals filed for this visit.  Visit Diagnosis:  Pelvic floor dysfunction - Plan: PT plan of care cert/re-cert      Subjective Assessment - 09/02/14 1104    Subjective Patient is having difficulty with bowel and bladder control. Patient reports with regular bowel movement. Patient reports he is not catherized.  Patient reports he used to be trained with voiding.    Patient Stated Goals control with bowel movement and urination.    Currently in Pain? No/denies            [facility] PT Assessment - 09/02/14 0001    Assessment   Medical Diagnosis R32, R15.9 urinary and bowel incontinence; G82.20 Paraplegia   Onset Date/Surgical Date 03/13/07  chronic   Balance Screen   Has the patient fallen in the past 6 months No   Has the patient had a decrease in activity level because of a fear of falling?  No   Is the patient reluctant to leave their home because of a fear of falling?  No   Prior Function   Level of Independence Independent;Independent with community mobility without device  uses a wheelchair    Observation/Other Assessments   Focus on Therapeutic Outcomes (FOTO)  70% limitation                 Pelvic Floor Special Questions - 09/02/14 0001    Urinary Leakage Yes  does not have the urge   Pad use wears briefs   Fecal incontinence Yes  due to not having the urge                  PT Education - 09/02/14 1130    Education provided Yes   Education Details bowel and urinary control techniques.    Person(s) Educated Patient   Methods Explanation;Verbal cues;Handout   Comprehension Verbalized understanding    Patient was educated on bowel health, timed voiding every 2 hours for urination using a penile clamp, voiding 1 hour after a meal for a bowel movement, eating same size meal at same time daily.  Patient verbally understands.         PT Long Term Goals - 09/02/14 1140    PT LONG TERM GOAL #1   Title understand items to purchase for urinary incontinence including penile clamp   Time 1   Period Days   Status Achieved   PT LONG TERM GOAL #2   Title understand timed voiding  1 hour after a meal for bowel incontinence   Time 1   Period Days   Status Achieved   PT LONG TERM GOAL #3   Title understand timed voiding for urination no more than every 2 hours   Time 1   Period Weeks   Status Achieved               Plan - 09/02/14 1131    Clinical Impression Statement Patient is a 29 year old male with diagnosis of urinary and bowel incontinence.  Patient is a paraplegia his whole life.  Patient was educated in timed voiding and catherization in the past but has not used the techniques in the past 6 years.  Patient does not have the urge to have a bowel movement or for urination. Patient wants to know ways to control his urination and bowel movements. Physical therapist educated patient  on timed voiding and use of penile clamp for urination.  Educated patient on eating at regular times with same size meals then go to have a bowel movement 1 hour  after eating to regulate bowels. Patient verbally understands.    Pt will benefit from skilled therapeutic intervention in order to improve on the following deficits Other (comment)  education on behavioral modifications   Rehab Potential Excellent   PT Frequency 1x / week  1 tme session   PT Duration --  for initial evaluation and treatment   PT Treatment/Interventions ADLs/Self Care Home Management;Other (comment);Patient/family education  behavioral modifications   PT Next Visit Plan Discharge to home program; 1 time visit   PT Home Exercise Plan home program on behavioral modifications; purchase a penile clamp to prevent urinary incontinence but void no more than every 2 hours   Recommended Other Services purchase a penile clamp to prevent urinary incontinence but void no more than every 2 hours   Consulted and Agree with Plan of Care Patient         Problem List Patient Active Problem List   Diagnosis Date Noted  . Annual physical exam 05/01/2013  . Acne 05/01/2013  . Substance abuse 09/05/2011  . 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 PARAPLEGIA 04/08/2007  . ASTHMA 04/08/2007  . NEUROGENIC BLADDER 04/08/2007  . HIDRADENITIS SUPPURATIVA 04/08/2007    GRAY,CHERYL,PT 09/02/2014, 11:47 AM  [facility] [address], [place], [place], [address] Phone: [phone]   Fax:  [phone]

## 2014-09-02 NOTE — Patient Instructions (Signed)
Bowel Control and Holding On When bowel control is decreased or lost it is helpful to understand some control techniques. Here are some tips for controlling your bowel movements.  1. Choose the best time of day to have a bowel movement:  Usually the best time of day for a bowel movement will be a half hour to an hour after breakfast.  For some people, a half hour to an hour after lunch will work better.  These times are best because the body uses the gastrocolic reflex, a stimulation of bowel motion that occurs with eating, to help produce a bowel movement.  2. Make sure that you are not rushed and have convenient access to a bathroom at your selected time.  3. Eat all your meals at a predictable time each day.  The bowel functions best when food is introduced at the same regular intervals.  4. The amount of food eaten at a given time of day should be about the same size from day to day.  The bowel functions best when food is introduced in similar quantities from day to day.  It is fine to have a small breakfast and a large lunch, or vice versa, just be consistent.  5. Eat two servings of fruit or vegetables and at least one serving of complex carbohydrates (whole grains such as brown rice, bran, whole wheat bread, or oatmeal) at each meal.   A serving of fruit or vegetables is a half-cup or medium-sized piece of fruit.  A serving of a complex carbohydrate is a half-cup or a slice of bread.  It is often desirable to eat more than the recommended minimum amounts of fruits, vegetables, and complex carbohydrates.  6. Drink plenty of water-ideally eight glasses a day.  7. Until regular bowel movements are established at a desired time of day, take 2-3 dried prunes (or  to 1/3 cup of prune juice) each night to stimulate morning bowel function.  8. Exercise daily.  You may exercise at any time of day, but you may find that bowel function is helped most if the exercise is at a consistent time each  day.  [facility] [address] [place], [place] [address] Phone # [phone] Fax [phone] [email or web address]

## 2014-10-05 ENCOUNTER — Telehealth: Payer: Self-pay | Admitting: Internal Medicine

## 2014-10-05 NOTE — Telephone Encounter (Signed)
C[place]-- please advise.

## 2014-10-05 NOTE — Telephone Encounter (Signed)
For insurance to pay he will have to have office visit within 30 days of prescription usually. I do not have a problem writing the Rx if I can see them. Otherwise we may run into issues with insurance paying for it.

## 2014-10-05 NOTE — Telephone Encounter (Signed)
Left message on voicemail to return my call.  

## 2014-10-05 NOTE — Telephone Encounter (Signed)
Caller name: Jaris Kohles Relationship to patient: mother Can be reached: [phone]  Reason for call: Rehab is recommending pt have a portable commode. In order for insurance to cover they need an RX. Leane Call will come in to pick up the RX. Please call her when ready. I did advise pt last visit 10/13/13 and may need to be seen. She said they can schedule if needed but she would like to get RX asap.

## 2014-10-08 NOTE — Telephone Encounter (Signed)
Notified pt's mom and scheduled appointment with PCP for 10/18/14 at 10:45am.

## 2014-10-18 ENCOUNTER — Ambulatory Visit: Payer: BLUE CROSS/BLUE SHIELD | Admitting: Internal Medicine

## 2014-11-10 ENCOUNTER — Telehealth: Payer: Self-pay | Admitting: Internal Medicine

## 2014-11-10 NOTE — Telephone Encounter (Signed)
I have not seen any paperwork from [employer] yet. However, Pt has not been seen since 10/2013, Dr. Larose Kells will need an OV before he will sign paperwork orders when received. Please inform Pt's mother, Lee Reilly, that Pt will need 30 min appt for paperwork to be completed when received. I will be on the look out for it.

## 2014-11-10 NOTE — Telephone Encounter (Signed)
Caller name: Maynor Mwangi  Relationship to patient: mother Can be reached: [phone] Pharmacy:  Reason for call: Pt's mother says that [employer] sent over a fax for a prescription refill. She says that Dr. Larose Kells has to sign off on it in order for them to fill it. She is requesting confirmation.

## 2014-12-24 ENCOUNTER — Encounter: Payer: Self-pay | Admitting: Internal Medicine

## 2014-12-24 ENCOUNTER — Telehealth: Payer: Self-pay

## 2014-12-24 ENCOUNTER — Ambulatory Visit (INDEPENDENT_AMBULATORY_CARE_PROVIDER_SITE_OTHER): Payer: BLUE CROSS/BLUE SHIELD | Admitting: Internal Medicine

## 2014-12-24 VITALS — BP 118/76 | HR 78 | Temp 97.9°F | Resp 16

## 2014-12-24 DIAGNOSIS — Z0000 Encounter for general adult medical examination without abnormal findings: Secondary | ICD-10-CM | POA: Diagnosis not present

## 2014-12-24 DIAGNOSIS — D72829 Elevated white blood cell count, unspecified: Secondary | ICD-10-CM

## 2014-12-24 DIAGNOSIS — D649 Anemia, unspecified: Secondary | ICD-10-CM

## 2014-12-24 DIAGNOSIS — Z114 Encounter for screening for human immunodeficiency virus [HIV]: Secondary | ICD-10-CM

## 2014-12-24 LAB — CBC WITH DIFFERENTIAL/PLATELET
BASOS ABS: 0 10*3/uL (ref 0.0–0.1)
Basophils Relative: 0 % (ref 0–1)
EOS ABS: 0.3 10*3/uL (ref 0.0–0.7)
Eosinophils Relative: 2 % (ref 0–5)
HCT: 38.8 % — ABNORMAL LOW (ref 39.0–52.0)
Hemoglobin: 12.7 g/dL — ABNORMAL LOW (ref 13.0–17.0)
LYMPHS ABS: 1.7 10*3/uL (ref 0.7–4.0)
Lymphocytes Relative: 12 % (ref 12–46)
MCH: 25.5 pg — ABNORMAL LOW (ref 26.0–34.0)
MCHC: 32.7 g/dL (ref 30.0–36.0)
MCV: 77.8 fL — ABNORMAL LOW (ref 78.0–100.0)
MPV: 9.4 fL (ref 8.6–12.4)
Monocytes Absolute: 0.8 10*3/uL (ref 0.1–1.0)
Monocytes Relative: 6 % (ref 3–12)
NEUTROS PCT: 80 % — AB (ref 43–77)
Neutro Abs: 11.2 10*3/uL — ABNORMAL HIGH (ref 1.7–7.7)
PLATELETS: 407 10*3/uL — AB (ref 150–400)
RBC: 4.99 MIL/uL (ref 4.22–5.81)
RDW: 14.9 % (ref 11.5–15.5)
WBC: 14 10*3/uL — AB (ref 4.0–10.5)

## 2014-12-24 LAB — LIPID PANEL
CHOLESTEROL: 147 mg/dL (ref 125–200)
HDL: 39 mg/dL — ABNORMAL LOW (ref >=40)
LDL Cholesterol: 92 mg/dL (ref <130)
Total CHOL/HDL Ratio: 3.8 Ratio (ref <=5.0)
Triglycerides: 81 mg/dL (ref <150)
VLDL: 16 mg/dL (ref <30)

## 2014-12-24 LAB — COMPREHENSIVE METABOLIC PANEL
ALT: 16 U/L (ref 9–46)
AST: 16 U/L (ref 10–40)
Albumin: 3.7 g/dL (ref 3.6–5.1)
Alkaline Phosphatase: 66 U/L (ref 40–115)
BUN: 10 mg/dL (ref 7–25)
CO2: 26 mmol/L (ref 20–31)
CREATININE: 0.45 mg/dL — AB (ref 0.60–1.35)
Calcium: 9.2 mg/dL (ref 8.6–10.3)
Chloride: 102 mmol/L (ref 98–110)
Glucose, Bld: 79 mg/dL (ref 65–99)
Potassium: 4 mmol/L (ref 3.5–5.3)
Sodium: 138 mmol/L (ref 135–146)
TOTAL PROTEIN: 7.4 g/dL (ref 6.1–8.1)
Total Bilirubin: 0.5 mg/dL (ref 0.2–1.2)

## 2014-12-24 NOTE — Telephone Encounter (Signed)
Letter printed and signed, given to Pt on 12/24/2014 at [facility].

## 2014-12-24 NOTE — Assessment & Plan Note (Addendum)
Td 2011 PNM shot 2015 Needs a Prevnar and flushot -- Recommend to get them as soon as his upper respiratory symptoms resolve Never had a cscope Labs  Good nutrition encourage

## 2014-12-24 NOTE — Progress Notes (Signed)
Subjective:    Patient ID: Lee Reilly, male    DOB: 23-Jul-1985, 29 y.o.   MRN: [id number]  DOS:  12/24/2014 Type of visit - description : CPX Interval history: Other than having a cold, he feels well, at baseline    Review of Systems  Constitutional: No fever. No chills. No unexplained wt changes. No unusual sweats  HEENT: No dental problems, no ear discharge, no facial swelling, no voice changes. No eye discharge, no eye  redness , no  intolerance to light . Few days history of mild sore throat and clear, watery nasal discharge.  Respiratory: No wheezing , no  difficulty breathing. No cough , no mucus production  Cardiovascular: No CP, no leg swelling , no  Palpitations  GI: no nausea, no vomiting, no diarrhea , no  abdominal pain.  No blood in the stools. No dysphagia, no odynophagia    Endocrine: No polyphagia, no polyuria , no polydipsia  GU: No dysuria, gross hematuria; history of incontinence Musculoskeletal: No unusual joint swellings or unusual aches or pains  Skin: No change in the color of the skin, palor , no  Rash  Allergic, immunologic: No environmental allergies , no  food allergies  Neurological: No dizziness no  syncope. No headaches. No diplopia, no slurred, no slurred speech, no motor deficits, no facial  Numbness  Hematological: No enlarged lymph nodes, no easy bruising , no unusual bleedings  Psychiatry: No suicidal ideas, no hallucinations, no beavior problems, no confusion.  No unusual/severe anxiety, no depression   Past Medical History  Diagnosis Date  . Paraplegia [facility])     Tumor T4 @ age 73month, had surgery  . Neurogenic bladder     has no control, occasional cath  . Asthma   . Acne 05/01/2013    Past Surgical History  Procedure Laterality Date  . Spine surgery      5months and 8 years  . Burn r leg  2015    @ [place]  . Foot surgery Left 2015    @ [place]    Social History   Social History  . Marital Status: Single   Spouse Name: N/A  . Number of Children: 0  . Years of Education: N/A   Occupational History  . unemployed     Social History Main Topics  . Smoking status: Current Some Day Smoker  . Smokeless tobacco: Never Used     Comment: << 1 ppd   . Alcohol Use: 0.0 oz/week    0 Standard drinks or equivalent per week     Comment: socially   . Drug Use: Yes     Comment: Smokes Marihuana, denies others  . Sexual Activity: Not on file   Other Topics Concern  . Not on file   Social History Narrative   lives by himself    finished college 2007, finish masters degree 07-2009, moved back to [place] from [place], then moved to [place], back in [place] since 05-2014.   Lives by himself     Family History  Problem Relation Age of Onset  . Diabetes Father   . Colon cancer Neg Hx   . Prostate cancer Neg Hx   . CAD Neg Hx        Medication List       This list is accurate as of: 12/24/14 11:59 PM.  Always use your most recent med list.               AMBULATORY NON  FORMULARY MEDICATION  Systems analyst for Wheelchair.     clindamycin 1 % gel  Commonly known as:  CLINDAGEL  Apply topically 2 (two) times daily.     hydrocortisone 2.5 % cream  Apply topically 2 (two) times daily.     Wheelchair [employer]  Use as directed with wheelchair.           Objective:   Physical Exam BP 118/76 mmHg  Pulse 78  Temp(Src) 97.9 F (36.6 C) (Oral)  Resp 16  Ht   Wt  General:   Well developed, well nourished . NAD.  HEENT:  Normocephalic . Face symmetric, atraumatic. TMs normal. Nose congested, signs no TTP. Throat symmetric, no redness. Lungs:  CTA B Normal respiratory effort, no intercostal retractions, no accessory muscle use. Heart: RRR,  no murmur.  Mild edema, left leg, nontender finding. Abdomen:  Not distended, soft, non-tender. No rebound or rigidity.   Skin: Not pale. Not jaundice Neurologic:  alert & oriented X3.  Speech normal, gait not tested.  Psych--  Cognition  and judgment appear intact.  Cooperative with normal attention span and concentration.  Behavior appropriate. No anxious or depressed appearing.    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

## 2014-12-24 NOTE — Telephone Encounter (Signed)
-----   Message from Colon Branch, MD sent at 12/24/2014  4:09 PM EDT ----- To whom it may concern ....... is a patient of mine, he has paraplegia and a number of other challenges. At this point he is having a difficult time finding a job and paying his debts.

## 2014-12-24 NOTE — Patient Instructions (Signed)
Get your blood work before you leave   Rest Drink plenty of fluids Tylenol Mucinex DM if cough OTC Flonase for nasal congestion Call if not improving in the next few days  Get a Prevnar and a flu shot as soon as you feel better. Please call the office or  get them in your pharmacy.   Next visit  for a physical exam in one year, fasting     Please schedule an appointment at the front desk

## 2014-12-24 NOTE — Progress Notes (Signed)
Pre visit review using our clinic review tool, if applicable. No additional management support is needed unless otherwise documented below in the visit note. 

## 2014-12-25 LAB — HIV ANTIBODY (ROUTINE TESTING W REFLEX): HIV 1&2 Ab, 4th Generation: NONREACTIVE

## 2014-12-31 NOTE — Addendum Note (Signed)
Addended by: Wilfrid Lund on: 12/31/2014 01:05 PM   Modules accepted: Orders

## 2015-01-10 ENCOUNTER — Telehealth: Payer: Self-pay | Admitting: Internal Medicine

## 2015-01-10 NOTE — Telephone Encounter (Signed)
Pt's friend brought documents to be filled out for pt. Came with also letter with indication to it.

## 2015-01-11 NOTE — Telephone Encounter (Signed)
Form filled out as much as possible and forwarded to Dr. Paz. JG//CMA 

## 2015-01-12 NOTE — Telephone Encounter (Signed)
Form received on 01/10/2015, paperwork can take 5-7 business days to complete, also, we can not speak with Bruce, Pt's friend, he is not listed on DPR/HIPAA release. Form still with Dr. Larose Kells, will call Pt's mother, Guadalupe Maple, when ready for pick up.

## 2015-01-12 NOTE — Telephone Encounter (Signed)
Caller name: Fadi Menter / Bruce   Relationship to patient: Mom / pt friend   Can be reached: [phone]   Reason for call: Friend called in to confirm receipt of paperwork for pt. He says that he  is helping mom with things because she is a Education officer, museum and works all of the time. He says that he would like to have paperwork completed and ready for pick up tomorrow, if possible. He would like for [place] to contact pt's mother at number provided when ready for pick up.

## 2015-01-19 NOTE — Telephone Encounter (Signed)
Pt's mother called in to check the status of her son's paperwork . She says that it has been beyond the 5-7 business days and would like a call back directly to check the status of things.   CB: [id number]  Please advise.   Thanks

## 2015-01-20 NOTE — Telephone Encounter (Signed)
Called and spoke with pt's mother explaining the information below. She verbalized understanding and stated that a referral to OT would not be necessary. No further questions/concerns. JG//CMA

## 2015-01-20 NOTE — Telephone Encounter (Signed)
The paperwork ask if the applicant has a  physical and mental impairment that prevents him from engaging in any substantial gainful activity in any field of work. While the patient is paraplegic, he is capable of doing a desk job. If he feels he is not, recommend a OT eval.

## 2015-01-20 NOTE — Telephone Encounter (Signed)
Currently with Dr. Paz. JG//CMA  

## 2015-02-20 ENCOUNTER — Telehealth: Payer: Self-pay | Admitting: Internal Medicine

## 2015-02-20 DIAGNOSIS — D649 Anemia, unspecified: Secondary | ICD-10-CM

## 2015-02-20 NOTE — Telephone Encounter (Signed)
Please call the patient and arrange a lab appointment only, orders are already in.

## 2015-04-25 ENCOUNTER — Telehealth: Payer: Self-pay | Admitting: Internal Medicine

## 2015-04-25 NOTE — Telephone Encounter (Signed)
i don't think we ever contacted him, please arrange lab appointment

## 2015-04-25 NOTE — Telephone Encounter (Signed)
Please advise 

## 2015-04-25 NOTE — Telephone Encounter (Signed)
Pt is needing a letter to state his lung capacity is lower or than that of most people or that he has a reduced due to being paraplegic. Mom is bring pt for labs 2/17. Please have ready for her to pick up by then if possible. Please call mom at [phone] with questions.

## 2015-04-26 NOTE — Telephone Encounter (Signed)
Letter printed, awaiting MD signature.

## 2015-04-26 NOTE — Telephone Encounter (Signed)
Pt is coming in for labs on Friday, 04/29/2015.

## 2015-04-26 NOTE — Telephone Encounter (Signed)
Print a letter Mr. Ashcraft is a patient of mine, he has a number of medical conditions including paraplegia and asthma, his lung capacity is decreased compared to other  people his age.

## 2015-04-26 NOTE — Telephone Encounter (Signed)
Letter placed in envelope at front desk for pick up.

## 2015-04-29 ENCOUNTER — Other Ambulatory Visit (INDEPENDENT_AMBULATORY_CARE_PROVIDER_SITE_OTHER): Payer: BLUE CROSS/BLUE SHIELD

## 2015-04-29 DIAGNOSIS — D649 Anemia, unspecified: Secondary | ICD-10-CM

## 2015-04-29 DIAGNOSIS — D72829 Elevated white blood cell count, unspecified: Secondary | ICD-10-CM

## 2015-04-29 LAB — CBC WITH DIFFERENTIAL/PLATELET
BASOS PCT: 0 % (ref 0–1)
Basophils Absolute: 0 10*3/uL (ref 0.0–0.1)
EOS ABS: 0.2 10*3/uL (ref 0.0–0.7)
EOS PCT: 3 % (ref 0–5)
HCT: 37.5 % — ABNORMAL LOW (ref 39.0–52.0)
Hemoglobin: 12.1 g/dL — ABNORMAL LOW (ref 13.0–17.0)
Lymphocytes Relative: 17 % (ref 12–46)
Lymphs Abs: 1.4 10*3/uL (ref 0.7–4.0)
MCH: 24.2 pg — AB (ref 26.0–34.0)
MCHC: 32.3 g/dL (ref 30.0–36.0)
MCV: 75 fL — ABNORMAL LOW (ref 78.0–100.0)
MONO ABS: 0.7 10*3/uL (ref 0.1–1.0)
MONOS PCT: 9 % (ref 3–12)
MPV: 9.2 fL (ref 8.6–12.4)
Neutro Abs: 5.8 10*3/uL (ref 1.7–7.7)
Neutrophils Relative %: 71 % (ref 43–77)
PLATELETS: 377 10*3/uL (ref 150–400)
RBC: 5 MIL/uL (ref 4.22–5.81)
RDW: 15.6 % — ABNORMAL HIGH (ref 11.5–15.5)
WBC: 8.2 10*3/uL (ref 4.0–10.5)

## 2015-04-29 LAB — IRON: IRON: 31 ug/dL — AB (ref 50–180)

## 2015-04-30 LAB — FERRITIN: FERRITIN: 41 ng/mL (ref 20–345)

## 2015-05-04 ENCOUNTER — Other Ambulatory Visit: Payer: Self-pay

## 2015-05-04 DIAGNOSIS — D509 Iron deficiency anemia, unspecified: Secondary | ICD-10-CM

## 2015-05-06 ENCOUNTER — Telehealth: Payer: Self-pay | Admitting: *Deleted

## 2015-05-06 DIAGNOSIS — G8220 Paraplegia, unspecified: Secondary | ICD-10-CM

## 2015-05-06 NOTE — Telephone Encounter (Signed)
Received fax from [facility] [employer] requesting PT/OT evaluation order [for assessment] for [facility] with Dx listed; order placed and printed; forwarded to provider/SLS 02/24

## 2015-05-09 NOTE — Telephone Encounter (Signed)
Completed form and Rx faxed to [phone]/SLS 02/27

## 2015-05-13 ENCOUNTER — Telehealth: Payer: Self-pay | Admitting: Internal Medicine

## 2015-05-13 NOTE — Telephone Encounter (Signed)
LM for pt to call and schedule flu shot or update records & and due for prevnar (pneumonia) shot

## 2015-06-01 ENCOUNTER — Encounter: Payer: Self-pay | Admitting: Internal Medicine

## 2015-06-13 ENCOUNTER — Encounter: Payer: Self-pay | Admitting: Internal Medicine

## 2015-06-13 ENCOUNTER — Ambulatory Visit (INDEPENDENT_AMBULATORY_CARE_PROVIDER_SITE_OTHER): Payer: BLUE CROSS/BLUE SHIELD | Admitting: Internal Medicine

## 2015-06-13 VITALS — BP 102/60 | HR 74 | Temp 98.2°F

## 2015-06-13 DIAGNOSIS — J4520 Mild intermittent asthma, uncomplicated: Secondary | ICD-10-CM | POA: Diagnosis not present

## 2015-06-13 DIAGNOSIS — G8220 Paraplegia, unspecified: Secondary | ICD-10-CM | POA: Diagnosis not present

## 2015-06-13 DIAGNOSIS — J45909 Unspecified asthma, uncomplicated: Secondary | ICD-10-CM

## 2015-06-13 DIAGNOSIS — Z23 Encounter for immunization: Secondary | ICD-10-CM | POA: Diagnosis not present

## 2015-06-13 DIAGNOSIS — Z09 Encounter for follow-up examination after completed treatment for conditions other than malignant neoplasm: Secondary | ICD-10-CM

## 2015-06-13 NOTE — Progress Notes (Signed)
   Subjective:    Patient ID: Lee Reilly, male    DOB: [birth year], 30 y.o.   MRN: [id number]  DOS:  06/13/2015 Type of visit - description :  Acute visit Interval history: DMV is requesting the patient have a spirometry, he has a history of asthma and apparently due to poor effort was unable to complete the DVM spirometry Otherwise feeling well  Review of Systems Denies cough or wheezing. No fever chills No shortness of breath    Past Medical History  Diagnosis Date  . Paraplegia [facility])     Tumor T4 @ age 23month, had surgery  . Neurogenic bladder     has no control, occasional cath  . Asthma   . Acne 05/01/2013    Past Surgical History  Procedure Laterality Date  . Spine surgery      58months and 8 years  . Burn r leg  2015    @ [place]  . Foot surgery Left 2015    @ [place]    Social History   Social History  . Marital Status: Single    Spouse Name: N/A  . Number of Children: 0  . Years of Education: N/A   Occupational History  . unemployed     Social History Main Topics  . Smoking status: Current Some Day Smoker  . Smokeless tobacco: Never Used     Comment: << 1 ppd   . Alcohol Use: 0.0 oz/week    0 Standard drinks or equivalent per week     Comment: socially   . Drug Use: Yes     Comment: Smokes Marihuana, denies others  . Sexual Activity: Not on file   Other Topics Concern  . Not on file   Social History Narrative   lives by himself    finished college 2007, finish masters degree 07-2009, moved back to [place] from [place], then moved to [place], back in [place] since 05-2014.            Medication List       This list is accurate as of: 06/13/15  2:32 PM.  Always use your most recent med list.               AMBULATORY NON FORMULARY MEDICATION  Extra Seat Cushion for Wheelchair.     Wheelchair [employer]  Use as directed with wheelchair.           Objective:   Physical Exam BP 102/60 mmHg  Pulse 74  Temp(Src) 98.2 F (36.8 C) (Oral)   SpO2 98%  General:   Well developed, well nourished . NAD.  HEENT:  Normocephalic . Face symmetric, atraumatic Lungs:  CTA B Normal respiratory effort, no intercostal retractions, no accessory muscle use. Heart: RRR,  no murmur.  No pretibial edema bilaterally  MSK: Thoracic deformities consistent with history of paraplegia. Neurologic:  alert & oriented X3.  Speech normal Psych--  Behavior appropriate. No anxious or depressed appearing.      Assessment & Plan:   Assessment > Paraplegia---Tumor T4 @ age 40month, had surgery Neurogenic bladder -- s/p PT as outpt 08-2014 Asthma as a child Acne H/o substance abuse  PLAN: History of asthma and paraplegia: DMV  requested a spirometry test, will refer to pulmonary and complete paperwork w/ results. Primary care: Prevnar today, RTC 12-2015 , CPX

## 2015-06-13 NOTE — Progress Notes (Signed)
Pre visit review using our clinic review tool, if applicable. No additional management support is needed unless otherwise documented below in the visit note. 

## 2015-06-13 NOTE — Assessment & Plan Note (Signed)
History of asthma and paraplegia: DMV  requested a spirometry test, will refer to pulmonary and complete paperwork w/ results. Primary care: Prevnar today, RTC 12-2015 , CPX

## 2015-06-13 NOTE — Patient Instructions (Addendum)
We will call you about a pulmonary test called PFTs  Next visit should be by 10- 2017 for a physical, please be sure you have an appointment

## 2015-06-17 ENCOUNTER — Telehealth: Payer: Self-pay | Admitting: Internal Medicine

## 2015-06-17 NOTE — Telephone Encounter (Signed)
PFTs were ordered on 06/13/2015.

## 2015-06-17 NOTE — Telephone Encounter (Signed)
Patient is scheduled for 06/20/15, mother aware. Requesting call back from nurse regarding paperwork.

## 2015-06-17 NOTE — Telephone Encounter (Signed)
LMOM informing Pt's mother, Lee Reilly, that we do have the paperwork and waiting for Pt's PFTs that are scheduled on 06/20/2015 to be resulted before paperwork is completed. Also informed her once paperwork is completed I will call her to come pick up. Instructed to call if any further questions or concerns.

## 2015-06-17 NOTE — Telephone Encounter (Signed)
Pt's mom called in to check the status of a referral. She says that during visit son was advised that he would be sent over to [facility] for a breathing test. ALSO, she says that [place] took forms that need to be completed for breathing test from pt at time of visit.    She would like to check the status.   [id number] -Mom - [name]

## 2015-06-22 ENCOUNTER — Telehealth: Payer: Self-pay | Admitting: Internal Medicine

## 2015-06-22 NOTE — Telephone Encounter (Signed)
error 

## 2015-06-23 ENCOUNTER — Ambulatory Visit: Payer: BLUE CROSS/BLUE SHIELD | Attending: Internal Medicine | Admitting: Physical Therapy

## 2015-06-28 ENCOUNTER — Ambulatory Visit (HOSPITAL_COMMUNITY)
Admission: RE | Admit: 2015-06-28 | Discharge: 2015-06-28 | Disposition: A | Discharge status: Home / Self Care | Payer: BLUE CROSS/BLUE SHIELD | Source: Ambulatory Visit | Attending: Internal Medicine | Admitting: Internal Medicine

## 2015-06-28 DIAGNOSIS — J45909 Unspecified asthma, uncomplicated: Secondary | ICD-10-CM | POA: Diagnosis present

## 2015-06-28 LAB — PULMONARY FUNCTION TEST
DL/VA % pred: 94 %
DL/VA: 3.94 ml/min/mmHg/L
DLCO unc % pred: 31 %
DLCO unc: 7.25 ml/min/mmHg
FEF 25-75 POST: 1.82 L/s
FEF 25-75 Pre: 0.78 L/sec
FEF2575-%Change-Post: 133 %
FEF2575-%PRED-POST: 48 %
FEF2575-%Pred-Pre: 20 %
FEV1-%Change-Post: 43 %
FEV1-%PRED-PRE: 23 %
FEV1-%Pred-Post: 33 %
FEV1-POST: 1.21 L
FEV1-Pre: 0.84 L
FEV1FVC-%CHANGE-POST: 11 %
FEV1FVC-%PRED-PRE: 92 %
FEV6-%Change-Post: 37 %
FEV6-%Pred-Post: 34 %
FEV6-%Pred-Pre: 24 %
FEV6-Post: 1.46 L
FEV6-Pre: 1.06 L
FEV6FVC-%PRED-POST: 101 %
FEV6FVC-%Pred-Pre: 101 %
FVC-%Change-Post: 28 %
FVC-%PRED-POST: 33 %
FVC-%PRED-PRE: 26 %
FVC-POST: 1.46 L
FVC-PRE: 1.13 L
POST FEV1/FVC RATIO: 83 %
PRE FEV1/FVC RATIO: 75 %
Post FEV6/FVC ratio: 100 %
Pre FEV6/FVC Ratio: 100 %

## 2015-06-28 MED ORDER — ALBUTEROL SULFATE (2.5 MG/3ML) 0.083% IN NEBU
2.5000 mg | INHALATION_SOLUTION | Freq: Once | RESPIRATORY_TRACT | Status: AC
Start: 2015-06-28 — End: 2015-06-28
  Administered 2015-06-28: 2.5 mg via RESPIRATORY_TRACT

## 2015-06-30 ENCOUNTER — Other Ambulatory Visit: Payer: Self-pay

## 2015-06-30 MED ORDER — MOMETASONE FURO-FORMOTEROL FUM 100-5 MCG/ACT IN AERO: 1.0000 | INHALATION_SPRAY | Freq: Two times a day (BID) | RESPIRATORY_TRACT | Status: DC

## 2015-06-30 NOTE — Telephone Encounter (Signed)
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 Thressiamma verbalized understanding. Copies made and sent for scanning.

## 2015-07-01 ENCOUNTER — Telehealth: Payer: Self-pay | Admitting: Internal Medicine

## 2015-07-01 NOTE — Telephone Encounter (Signed)
Please advise 

## 2015-07-01 NOTE — Telephone Encounter (Signed)
Caller name:Hard,Carlen Relation to [phone] Call back Stanley:  Reason for call:  Patient states mometasone-formoterol (DULERA) 100-5 MCG/ACT AERO is to expensive. Requesting an alternate

## 2015-07-01 NOTE — Telephone Encounter (Signed)
Pt's [place] Formulary reviewed.   Lee Reilly is a Tier 4.  Advair products are Tier 3. Symbicort products are Tier 3.  Both should be cheaper for Pt out of pocket.    Please advise.

## 2015-07-01 NOTE — Telephone Encounter (Signed)
Needs to call his insurance: alternatives are advair and symbicort, to let us know what is covered

## 2015-07-04 MED ORDER — BUDESONIDE-FORMOTEROL FUMARATE 80-4.5 MCG/ACT IN AERO: 2.0000 | INHALATION_SPRAY | Freq: Two times a day (BID) | RESPIRATORY_TRACT | Status: DC

## 2015-07-04 NOTE — Telephone Encounter (Signed)
Advise patient,  Will try Symbicort, prescription sent, if that doesn't work consider Qvar

## 2015-07-04 NOTE — Telephone Encounter (Signed)
Left detailed message for pt that new rx was sent to pharmacy and to let us know if it does not work so we can consider another alternative.

## 2015-08-22 ENCOUNTER — Telehealth: Payer: Self-pay | Admitting: Internal Medicine

## 2015-08-22 ENCOUNTER — Ambulatory Visit (INDEPENDENT_AMBULATORY_CARE_PROVIDER_SITE_OTHER): Payer: BLUE CROSS/BLUE SHIELD | Admitting: Internal Medicine

## 2015-08-22 ENCOUNTER — Encounter: Payer: Self-pay | Admitting: Internal Medicine

## 2015-08-22 VITALS — BP 116/76 | HR 76 | Temp 97.8°F

## 2015-08-22 DIAGNOSIS — J45909 Unspecified asthma, uncomplicated: Secondary | ICD-10-CM

## 2015-08-22 NOTE — Patient Instructions (Signed)
Will schedule a spirometry

## 2015-08-22 NOTE — Assessment & Plan Note (Signed)
Asthma:  Will order another  spirometry per patient request. The patient also asked me to fill paperwork regards his asthma status for the [facility], he is trying to get a waiver (based on the dx of asthma)  for the interlock device. I filled a form already 06-28-15 stating he does have astma and should be ok once treated  I don't feel comfortable doing more paperwork for him, and this is the reason: on one hand he feels well enough from the asthma standpoint that he declined to take any medication and on the other hand he likes to be exempt from a interlock device based on his asthma. If he likes the paperwork completed, he will have to get the opinion of another provider. I recommend to see occupational health.

## 2015-08-22 NOTE — Telephone Encounter (Signed)
Attempted to call patient.  Line busy.  Mother is on patient's DPR.  Called mother back at number listed below. Again provider's recommendations discussed.  Mother states they are desperate.  They are working on getting patient's license back after he has been without them x 3 years due to a DUI.  Mother states they received the form on June 1 and it has to be completed within 30 days.  Mother was advised to call the number listed on patient's AVS to schedule appt with occupational health and then was made aware that he has an appt with [facility] on 09/01/15 for PFT.  She stated understanding and was appreciative for the call.  No further needs or concerns voiced.

## 2015-08-22 NOTE — Telephone Encounter (Signed)
Part of today's office visit note copied and pasted into telephone encounter. Will call Pt and/or his mother to inform of below.    PLAN: Will order a spirometry per patient request. The patient also asked me to fill paperwork regards his asthma status for the [facility], I feel uncomfortable doing that, recommend to see occupational health. Asthma: Asx, on no medications    ------------------------------ History of asthma and paraplegia: DMV requested a spirometry test, will refer to pulmonary and complete paperwork w/ results. Primary care: Prevnar today, RTC 12-2015 , CPX

## 2015-08-22 NOTE — Progress Notes (Signed)
Pre visit review using our clinic review tool, if applicable. No additional management support is needed unless otherwise documented below in the visit note. 

## 2015-08-22 NOTE — Telephone Encounter (Signed)
Caller name: Yerick, Gaye Relationship to patient: Mom Can be reached: [phone]  Reason for call: Read notes from OV to pt mother. She states that she is wanting a call back from Dr. Larose Kells or his nurse regarding the general health of the pt and asked "did Dr. Larose Kells have time to look at and read the complete form. This isn't just regarding his asthma but his general health conditions managed by Dr. Larose Kells."

## 2015-08-22 NOTE — Telephone Encounter (Signed)
Caller name: Devonn, [place] Relationship to patient: Mom Can be reached: [phone]   Reason for call: Pt mom calling stating that papers pt brought in were not filled out and he was told needed to be completed by OT. She is very confused and said they are on a time contraint. She is requesting nurse/Dr. Larose Kells call her.

## 2015-08-22 NOTE — Telephone Encounter (Signed)
Patient is an adult, d, I discussed the situation with him. Recommend to  be seen at the occupational clinic

## 2015-08-22 NOTE — Progress Notes (Signed)
Subjective:    Patient ID: Lee Reilly, male    DOB: [birth year], 30 y.o.   MRN: [id number]  DOS:  08/22/2015 Type of visit - description : Routine visit Interval history: The DMV is requesting a second spirometry and needs a referral. The patient had a DUI and he is trying to get a waiver for a the ignition interlock device mandated on him He has a history of asthma, he feels well, denies any cough, wheezing and consequently he is declining to take Symbicort or any other asthma medication   Review of Systems   Past Medical History  Diagnosis Date  . Paraplegia [facility])     Tumor T4 @ age 69month, had surgery  . Neurogenic bladder     has no control, occasional cath  . Asthma   . Acne 05/01/2013    Past Surgical History  Procedure Laterality Date  . Spine surgery      59months and 8 years  . Burn r leg  2015    @ [place]  . Foot surgery Left 2015    @ [place]    Social History   Social History  . Marital Status: Single    Spouse Name: N/A  . Number of Children: 0  . Years of Education: N/A   Occupational History  . unemployed     Social History Main Topics  . Smoking status: Current Some Day Smoker  . Smokeless tobacco: Never Used     Comment: << 1 ppd   . Alcohol Use: 0.0 oz/week    0 Standard drinks or equivalent per week     Comment: socially   . Drug Use: Yes     Comment: Smokes Marihuana, denies others  . Sexual Activity: Not on file   Other Topics Concern  . Not on file   Social History Narrative   lives by himself    finished college 2007, finish masters degree 07-2009, moved back to [place] from [place], then moved to [place], back in [place] since 05-2014.            Medication List       This list is accurate as of: 08/22/15  4:50 PM.  Always use your most recent med list.               AMBULATORY NON FORMULARY MEDICATION  Extra Seat Cushion for Wheelchair.     Wheelchair [employer]  Use as directed with wheelchair.             Objective:   Physical Exam BP 116/76 mmHg  Pulse 76  Temp(Src) 97.8 F (36.6 C) (Oral)  Ht   Wt   SpO2 98% General:   Well developed  . NAD.  HEENT:  Normocephalic . Face symmetric, atraumatic Lungs:  CTA B Normal respiratory effort, no intercostal retractions, no accessory muscle use. Heart: RRR,  no murmur.  No pretibial edema bilaterally  Skin: Not pale. Not jaundice Neurologic:  alert & oriented X3. Speech, and a Systems analyst--  Cognition and judgment appear intact.  Cooperative with normal attention span and concentration.  Behavior appropriate. No anxious or depressed appearing.      Assessment & Plan:   Assessment > Paraplegia---Tumor T4 @ age 58month, had surgery Neurogenic bladder -- s/p PT as outpt 08-2014 Asthma as a child Acne H/o substance abuse  PLAN: Asthma:  Will order another  spirometry per patient request. The patient also asked me to fill paperwork regards his asthma  status for the [facility], he is trying to get a waiver (based on the dx of asthma)  for the interlock device. I filled a form already 06-28-15 stating he does have astma and should be ok once treated  I don't feel comfortable doing more paperwork for him, and this is the reason: on one hand he feels well enough from the asthma standpoint that he declined to take any medication and on the other hand he likes to be exempt from a interlock device based on his asthma. If he likes the paperwork completed, he will have to get the opinion of another provider. I recommend to see occupational health.

## 2015-08-22 NOTE — Telephone Encounter (Signed)
Forwarding to RN team lead.

## 2015-08-23 NOTE — Telephone Encounter (Signed)
Caller name: Leane Call  Relationship to patient: Mother  Can be reached: [phone]   Reason for call: Mother request a call back in reference to form that needs to be completed for [facility]. Mother states she believe provider misunderstood what she was asking for and states he needs this filled out by his PCP per the DMV. Requested to have Ashleigh call her back because she spoke with her yesterday.

## 2015-08-23 NOTE — Telephone Encounter (Signed)
Mother returned call.  Stating that she thinks Dr. Larose Kells was confused about what was asked of him in completing form.  Mother states form has to be filled out by PCP.  She says she called occupational clinic using the number listed on AVS and they were told that the form they had was not the form they typically fill out.  That this particular form is to be filled out by the PCP.  Mother and son says that this form is very time-sensitive and they would really like for Dr. Larose Kells to fill out form as he knows patient best.   Please advise.

## 2015-08-23 NOTE — Telephone Encounter (Signed)
Called to follow up with mother.  Left a message for call back.

## 2015-08-24 NOTE — Telephone Encounter (Signed)
Caller name: Shajuan, Done  Relationship to patient: Mom  Can be reached: [phone]  Pt mother called again to f/u on form and discussion with Ashlee last night. She is requesting call back asap to let her know if he will complete.

## 2015-08-25 NOTE — Telephone Encounter (Signed)
Per Dr. Larose Kells he will not determine if he will sign the forms until after PFTs are completed.

## 2015-08-25 NOTE — Telephone Encounter (Signed)
Mother notified and made aware.  She stated understanding.  She said she will call back once tests are complete.

## 2015-08-25 NOTE — Telephone Encounter (Signed)
Patients Mother called back to follow up on form.

## 2015-09-01 ENCOUNTER — Telehealth: Payer: Self-pay | Admitting: Internal Medicine

## 2015-09-01 ENCOUNTER — Ambulatory Visit (INDEPENDENT_AMBULATORY_CARE_PROVIDER_SITE_OTHER): Payer: BLUE CROSS/BLUE SHIELD | Admitting: Internal Medicine

## 2015-09-01 DIAGNOSIS — J45909 Unspecified asthma, uncomplicated: Secondary | ICD-10-CM

## 2015-09-01 LAB — PULMONARY FUNCTION TEST
DL/VA % pred: 139 %
DL/VA: 5.81 ml/min/mmHg/L
DLCO COR % PRED: 75 %
DLCO COR: 17.23 ml/min/mmHg
DLCO UNC % PRED: 72 %
DLCO unc: 16.62 ml/min/mmHg
FEF 25-75 POST: 1.65 L/s
FEF 25-75 Pre: 0.99 L/sec
FEF2575-%Change-Post: 66 %
FEF2575-%Pred-Post: 43 %
FEF2575-%Pred-Pre: 26 %
FEV1-%Change-Post: 13 %
FEV1-%PRED-POST: 34 %
FEV1-%Pred-Pre: 29 %
FEV1-POST: 1.22 L
FEV1-Pre: 1.07 L
FEV1FVC-%Change-Post: 5 %
FEV1FVC-%Pred-Pre: 102 %
FEV6-%Change-Post: 8 %
FEV6-%PRED-POST: 32 %
FEV6-%Pred-Pre: 30 %
FEV6-PRE: 1.28 L
FEV6-Post: 1.39 L
FEV6FVC-%PRED-POST: 101 %
FEV6FVC-%PRED-PRE: 101 %
FVC-%Change-Post: 8 %
FVC-%Pred-Post: 32 %
FVC-%Pred-Pre: 29 %
FVC-POST: 1.39 L
FVC-Pre: 1.28 L
POST FEV6/FVC RATIO: 100 %
PRE FEV1/FVC RATIO: 83 %
Post FEV1/FVC ratio: 87 %
Pre FEV6/FVC Ratio: 100 %

## 2015-09-01 NOTE — Telephone Encounter (Signed)
Pt's mom called in to make Ashlee aware that pt had his breathing test today. She would like a follow up call back from Nurse when possible.    Thanks.

## 2015-09-01 NOTE — Telephone Encounter (Signed)
Called spoke with the pt's mother. Informed her that he has never been seen in the office and that we will send the results to Dr. Larose Kells. I explained to her that she would need to contact Dr. Larose Kells for any information considering the results. She voiced understanding and had no further questions. Nothing further questions.

## 2015-09-01 NOTE — Progress Notes (Signed)
PFT performed today. 

## 2015-09-02 NOTE — Telephone Encounter (Signed)
See telephone note 08/22/2015 for further information.

## 2015-09-02 NOTE — Telephone Encounter (Signed)
Patients Mom request a call when Dr. Larose Kells decides if he is going to sign the DMV form for patient. States she will bring the form to the office if he decides to sign it.

## 2015-09-02 NOTE — Telephone Encounter (Signed)
Call Documentation      Quintin Alto at 09/02/2015 4:10 PM     Status: Signed       Expand All Collapse All   Patients Mom request a call when Dr. Larose Kells decides if he is going to sign the DMV form for patient. States she will bring the form to the office if he decides to sign it.            Osa Craver, CMA at 09/01/2015 3:59 PM     Status: Signed       Expand All Collapse All   Called spoke with the pt's mother. Informed her that he has never been seen in the office and that we will send the results to Dr. Larose Kells. I explained to her that she would need to contact Dr. Larose Kells for any information considering the results. She voiced understanding and had no further questions. Nothing further questions.

## 2015-09-02 NOTE — Telephone Encounter (Signed)
Okay to provide a copy of the PFTs as requested by the DMV. I can sign  the muscle skeletal section of the paperwork but not the respiratory part. For that needs occupational therapy

## 2015-09-06 NOTE — Telephone Encounter (Signed)
Mother brought in forms.  Forms given to provider for completion. Provider filled out the appropriate sections.  Medication list and PFT results attached.  Called mother to make her aware that paperwork is ready for pick up.  She said she would come by this afternoon to pick them up.

## 2015-09-08 ENCOUNTER — Encounter: Payer: Self-pay | Admitting: Sports Medicine

## 2015-09-08 ENCOUNTER — Ambulatory Visit (INDEPENDENT_AMBULATORY_CARE_PROVIDER_SITE_OTHER): Payer: BLUE CROSS/BLUE SHIELD | Admitting: Sports Medicine

## 2015-09-08 VITALS — BP 119/76 | HR 114

## 2015-09-08 DIAGNOSIS — F1910 Other psychoactive substance abuse, uncomplicated: Secondary | ICD-10-CM

## 2015-09-08 NOTE — Assessment & Plan Note (Signed)
Advised against substance use, he had his first DUI, he agrees not to do this again, DMV paperwork filled out. His FEV1 was borderline to use a breath alcohol interlock device.

## 2015-09-08 NOTE — Progress Notes (Signed)
  Subjective:    CC: Establish care.   HPI:  This is a pleasant 30 year old male with history of T4 neuroblastoma paraplegia, he recently had a DUI, needs paperwork filled out for the [facility]. He does have moderate to severe asthma, with limited forced expiratory volume in 1 second, and was unable to do enough of a breath to activate the breath alcohol interlock device.  Past medical history, Surgical history, Family history not pertinant except as noted below, Social history, Allergies, and medications have been entered into the medical record, reviewed, and no changes needed.   Review of Systems: No headache, visual changes, nausea, vomiting, diarrhea, constipation, dizziness, abdominal pain, skin rash, fevers, chills, night sweats, swollen lymph nodes, weight loss, chest pain, body aches, joint swelling, muscle aches, shortness of breath, mood changes, visual or auditory hallucinations.  Objective:    General: Well Developed, well nourished, and in no acute distress.  Neuro: Alert and oriented x3, extra-ocular muscles intact, sensation grossly intact.  HEENT: Normocephalic, atraumatic, pupils equal round reactive to light, neck supple, no masses, no lymphadenopathy, thyroid nonpalpable.  Skin: Warm and dry, no rashes noted.  Cardiac: Regular rate and rhythm, no murmurs rubs or gallops.  Respiratory: Clear to auscultation bilaterally. Not using accessory muscles, speaking in full sentences.  Abdominal: Soft, nontender, nondistended, positive bowel sounds, no masses, no organomegaly.  Musculoskeletal: Shoulder, elbow, wrist, Knee with full range of motion and strength, neck with full range of motion and strength, paraplegic from the chest down.  Impression and Recommendations:    The patient was counselled, risk factors were discussed, anticipatory guidance given.

## 2015-09-09 ENCOUNTER — Telehealth: Payer: Self-pay | Admitting: Sports Medicine

## 2015-09-09 NOTE — Telephone Encounter (Signed)
Just have her drop it off and wait...but not wait for me to come out or anything.

## 2015-09-09 NOTE — Telephone Encounter (Signed)
FYI: Mom called.  There was one sheet on her son's  medical report that needed your signature and she would like to come by today to have report signed.  She did not give a specific time but only that she will be here as soon as she can. She has also left you a vm on your cell.

## 2016-08-23 ENCOUNTER — Telehealth: Payer: Self-pay | Admitting: Sports Medicine

## 2016-08-23 ENCOUNTER — Ambulatory Visit: Payer: BLUE CROSS/BLUE SHIELD | Admitting: Sports Medicine

## 2016-08-23 ENCOUNTER — Telehealth: Payer: Self-pay | Admitting: Internal Medicine

## 2016-08-23 NOTE — Telephone Encounter (Signed)
Pt mother called states Lee Reilly called here spoke to someone to clear up Lee Reilly is the PCP. Lee Reilly did not agree to sign a portion of a form June 2017 so mother to pt to Ortho / sports medicine and they completed it. Pt has seen no one else. Pt needs PCP to authorize pt wheelchair to be repaired. Per Lee Reilly nurse she has not spoken to anyone relating to this request. Is Lee Reilly still pt PCP? Can someone sign for pt wheelchair to be repaired in [facility] absence while he is on vacation. Mother is willing to bring pt in for appt if we will allow.

## 2016-08-23 NOTE — Telephone Encounter (Signed)
--   pt did see another MD but apparently did not successfully established with him --Haven't see him in a year, won't ne able to sign paper w/o OV and won't be able to see him in > 10 days d/t me leaving town. --previously I felt very uncomfortable signing papers at his request. With this in mind,  needs OV to get paperwork. After that is done and the pt is taking care off , will dismiss from mi practice unless another provider in [place] wishes to accept him

## 2016-08-23 NOTE — Telephone Encounter (Signed)
I have not spoke w/ Pt or his mother [name] regarding this situation, unsure who Ms. Jimmye Norman is (unless she is referring to Justice Rocher whom hasn't been employed by office in over 2 years). OV note from 09/08/2015 Dr. Dianah Field reviewed, Pt was there to establish care with their office. Pt has not been seen by Dr. Larose Kells since 08/2015, forms would not be able to be completed by him until he has been accepted back into the practice by Dr. Larose Kells and seen by Dr. Larose Kells again (if Dr. Larose Kells reaccepts Pt, as of last week Dr. Larose Kells was not accepting new patients). Dr. Larose Kells will be in later this afternoon but will be on vacation from June 15-25. Will route message to him as well.

## 2016-08-23 NOTE — Telephone Encounter (Signed)
From the sounds of it he is still technically [name] patient. If he is going to D/C him, I will only see him for the one visit to address his immediate need. If not, OK to change to me. TY.

## 2016-08-23 NOTE — Telephone Encounter (Signed)
Mom request to transfer care from Dr. Darene Lamer back to [facility] with Dr. Nani Ravens  [phone]

## 2016-08-23 NOTE — Telephone Encounter (Signed)
Patient's mother called at 9:30am stating she received a call from our office that Dr. Darene Lamer would not see patient today because he is not his pcp. Dr. Darene Lamer only saw this patient as [facility] patient that one time June 2017 as a family friend favor. Pt is still a pcp of Dr. Larose Kells per Dr. Darene Lamer and will need to call that office to be seen and have form completed for wheelchair. Spoke with Langley Gauss at [facility]) today and explained what was happening that patient is still Dr. Larose Kells patient not Dr. Darene Lamer and need to schedule an appointment with him at that location. Patient's mother called over to [place] after speaking with Mrs. Jimmye Norman the scheduler at [facility] and the mother spoke with Angela Nevin and stated representative would not transfer her to Langley Gauss who was advised of the situation 20 minuets earlier from [place]. Mother is frustrated and feels sons care is not being handled properly and has asked to speak with Med Management consultant. Dr. Darene Lamer advised that he has never been Patient's primary care physician he just seen him one time June of 2017 as a favor and he needs to have form completed by his pcp Dr. Larose Kells.

## 2016-08-23 NOTE — Telephone Encounter (Signed)
Noted, he came to see me (sports medicine) once as a family favor for his DUI/breath alcohol interlock device waiver (lacked sufficient FEV1 to activate it considering T6 paraplegia.)  I never agreed to take him on as his PCP and am in fact not taking primary care patients.  I'm happy to consult for orthopaedic/sports medicine related complaints however.  I think there was some confusion for his mother with the front office secretaries marking down me in EPIC as the PCP erroneously and she didn't know who was going to be PCP.  I spoke to her on the phone and she is going to either establish with another provider at [facility] or someone here at [facility].    Sorry for any confusion and for the plethora of phone calls we have all had to field.    Dannis is a good kid and I think he will be a good patient to whomever but I don't think its ethical for ME to be seeing him in a long term primary care role with him being my cousin. ___________________________________________ Gwen Her. Dianah Field, M.D., ABFM., CAQSM. Primary Care and [place] Instructor of [place] of [facility] of Medicine

## 2016-08-24 NOTE — Telephone Encounter (Signed)
Lee Reilly, Lee Reilly (mother) called checking on the status of message below, mother was explained in detail that Dr. Nani Ravens will see him regarding this concern only in Dr. Larose Kells absence and office manager will following up regarding the final determination regarding continuing care with the practice, mother voice understanding and said thank you.    Patient scheduled with Dr. Nani Ravens for 08/29/16 30 minute appointment.

## 2016-08-29 ENCOUNTER — Ambulatory Visit (INDEPENDENT_AMBULATORY_CARE_PROVIDER_SITE_OTHER): Payer: BLUE CROSS/BLUE SHIELD | Admitting: Family Medicine

## 2016-08-29 ENCOUNTER — Encounter: Payer: Self-pay | Admitting: Family Medicine

## 2016-08-29 VITALS — BP 110/60 | HR 104 | Temp 98.3°F | Wt 207.6 lb

## 2016-08-29 DIAGNOSIS — Z0289 Encounter for other administrative examinations: Secondary | ICD-10-CM | POA: Diagnosis not present

## 2016-08-29 NOTE — Progress Notes (Signed)
Chief Complaint  Patient presents with  . Form to be for wheelchair    Subjective: Patient is a 31 y.o. male here for .  93-61 years old.  T4 paralysis from Neuroblastoma at 69 mo old. Front R wheel is not turning properly. This has been an issue starting in mid May of 2018. No accident that precipitated this. The rest of the wheelchair is working well. He has not fallen. It is not getting worse, but he would like to have it replaced. Needs a form filled out from [facility].  Family History  Problem Relation Age of Onset  . Diabetes Father   . Colon cancer Neg Hx   . Prostate cancer Neg Hx   . CAD Neg Hx    Past Medical History:  Diagnosis Date  . Acne 05/01/2013  . Asthma   . Neurogenic bladder    has no control, occasional cath  . Paraplegia [facility])    Tumor T4 @ age 72month, had surgery   No Known Allergies  Current Outpatient Prescriptions:  .  AMBULATORY NON FORMULARY MEDICATION, Extra Seat Cushion for Wheelchair., Disp: 1 Device, Rfl: 0 .  Misc. Devices [facility] CUSHION) MISC, Use as directed with wheelchair., Disp: 1 each, Rfl: 0  Objective: BP 110/60 (BP Location: Left Arm, Patient Position: Sitting, Cuff Size: Normal)   Pulse (!) 104   Temp 98.3 F (36.8 C) (Oral)   Wt 207 lb 9.6 oz (94.2 kg) Comment: With wheelchair  SpO2 96%   BMI 36.77 kg/m  General: Awake, appears stated age Heart: RRR on my exam with pulse approx 84 Lungs: CTAB, no rales, wheezes or rhonchi. No accessory muscle use MSK: Muscle wasting in LE's Psych: Age appropriate judgment and insight, normal affect and mood  Assessment and Plan: Encounter for completion of form with patient  Will fill out form, make copy and fax. Would like copy mailed to him. Follow-up as originally scheduled with regular PCP. The patient voiced understanding and agreement to the plan.  [place], DO 08/29/16  3:54 PM

## 2016-08-29 NOTE — Patient Instructions (Signed)
If you don't hear anything from [place] in 1-2 weeks by mail, give our office a call and ask for an update.   Once I fill it out, we will make a copy, fax it to the required place, and mail a copy to you.   We may reach out to you if we have further questions.

## 2016-09-03 NOTE — Telephone Encounter (Addendum)
Spoke w/ Dr. Nani Ravens- Pt came to [facility] on 08/29/2016 w/o wheelchair paperwork. Per AVS, Dr. Nani Ravens recommended f/u w/ PCP. Per PCP note below- after paperwork taken care of; to dismiss from practice. Will begin dismissal process.

## 2016-09-07 ENCOUNTER — Telehealth: Payer: Self-pay | Admitting: Internal Medicine

## 2016-09-07 NOTE — Telephone Encounter (Signed)
Patient dismissed from [facility] by Dr. Kathlene November, effective 09/03/16. Dismissal letter sent out by certified / registered mail. fbg

## 2016-09-18 NOTE — Telephone Encounter (Signed)
Received signed domestic return receipt verifying delivery of certified letter on September 14, 2016. Article number [id number] BEM

## 2016-10-11 NOTE — Telephone Encounter (Signed)
Patient was advised to inform PCP when established with a new doctor, patient scheduled with Dr. Russ Halo Beam [facility]

## 2017-05-14 ENCOUNTER — Encounter: Payer: Self-pay | Admitting: Gastroenterology

## 2017-06-26 ENCOUNTER — Ambulatory Visit (INDEPENDENT_AMBULATORY_CARE_PROVIDER_SITE_OTHER): Payer: BLUE CROSS/BLUE SHIELD | Admitting: Gastroenterology

## 2017-06-26 ENCOUNTER — Encounter: Payer: Self-pay | Admitting: Gastroenterology

## 2017-06-26 ENCOUNTER — Encounter (INDEPENDENT_AMBULATORY_CARE_PROVIDER_SITE_OTHER): Payer: Self-pay

## 2017-06-26 VITALS — BP 104/64 | HR 84

## 2017-06-26 DIAGNOSIS — R159 Full incontinence of feces: Secondary | ICD-10-CM | POA: Diagnosis not present

## 2017-06-26 NOTE — Patient Instructions (Signed)
If you are age 34 or older, your body mass index should be between 23-30. Your There is no height or weight on file to calculate BMI. If this is out of the aforementioned range listed, please consider follow up with your Primary Care Provider.  If you are age 35 or younger, your body mass index should be between 19-25. Your There is no height or weight on file to calculate BMI. If this is out of the aformentioned range listed, please consider follow up with your Primary Care Provider.   We will send your records to [facility].  They will contact you to schedule an appointment. Make certain to bring a list of current medications, including any over the counter medications or vitamins. Also bring your co-pay if you have one as well as your insurance cards. [facility] is located at [address], [address]. Should you need to reschedule your appointment, please contact them at [phone].  Thank you for choosing [facility]  Dr Wilfrid Lund III
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
## 2017-06-27 ENCOUNTER — Telehealth: Payer: Self-pay

## 2017-06-27 NOTE — Telephone Encounter (Signed)
Records faxed to [facility] for a NP appointment. Will await appointment information.

## 2017-07-04 NOTE — Telephone Encounter (Signed)
Appointment has been scheduled for 07-16-2017 @ 1130 am with an 11am arrival. Pt is to see Dr Dema Severin at [place].

## 2017-07-18 ENCOUNTER — Telehealth: Payer: Self-pay | Admitting: Gastroenterology

## 2017-07-18 NOTE — Telephone Encounter (Signed)
Please contact this patient.  I rec'd the consult note from Dr. Dema Severin of surgery.  He described patient's report of bowel habit changes.  He is concerned perhaps there is infection or other process causing change in bowel habits.  Before considering surgery, he would like stool studies done to rule out infection and a colonoscopy performed.  I agree.  Please arrange stool study for ova/parasites and C diff PCR.  Arrange colonoscopy to be done at hospital outpatient endoscopy lab due to patient's limited mobility from paraplegia.

## 2017-07-22 ENCOUNTER — Other Ambulatory Visit: Payer: Self-pay

## 2017-07-22 DIAGNOSIS — R197 Diarrhea, unspecified: Secondary | ICD-10-CM

## 2017-07-22 NOTE — Telephone Encounter (Signed)
Left message for patient to call our office. Labs ordered.

## 2017-07-22 NOTE — Telephone Encounter (Signed)
Spoke to patient, he will send his parents to come get the collection containers at our lab. He wants to think about the colonoscopy if he wishes to proceed, he will call back.

## 2017-07-23 ENCOUNTER — Other Ambulatory Visit: Payer: BLUE CROSS/BLUE SHIELD

## 2017-07-24 ENCOUNTER — Other Ambulatory Visit: Payer: BLUE CROSS/BLUE SHIELD

## 2017-07-24 DIAGNOSIS — R197 Diarrhea, unspecified: Secondary | ICD-10-CM

## 2017-07-25 LAB — OVA AND PARASITE EXAMINATION
CONCENTRATE RESULT: NONE SEEN
MICRO NUMBER:: 90592077
SPECIMEN QUALITY: ADEQUATE
TRICHROME RESULT: NONE SEEN

## 2017-07-26 ENCOUNTER — Telehealth: Payer: Self-pay | Admitting: Gastroenterology

## 2017-07-26 NOTE — Telephone Encounter (Signed)
Let patient know that test results are not ready yet.

## 2017-07-30 LAB — CLOSTRIDIUM DIFFICILE BY PCR: CDIFFPCR: NEGATIVE

## 2017-08-01 ENCOUNTER — Telehealth: Payer: Self-pay | Admitting: Gastroenterology

## 2017-08-01 NOTE — Telephone Encounter (Signed)
TC encounter made in error

## 2018-12-08 ENCOUNTER — Ambulatory Visit: Payer: Medicaid Other | Attending: Physician Assistant | Admitting: Physical Therapy

## 2019-01-19 ENCOUNTER — Ambulatory Visit: Payer: Medicaid Other

## 2019-09-10 DIAGNOSIS — Z419 Encounter for procedure for purposes other than remedying health state, unspecified: Secondary | ICD-10-CM | POA: Diagnosis not present

## 2019-10-11 DIAGNOSIS — Z419 Encounter for procedure for purposes other than remedying health state, unspecified: Secondary | ICD-10-CM | POA: Diagnosis not present

## 2019-11-11 DIAGNOSIS — Z419 Encounter for procedure for purposes other than remedying health state, unspecified: Secondary | ICD-10-CM | POA: Diagnosis not present

## 2019-12-11 DIAGNOSIS — Z419 Encounter for procedure for purposes other than remedying health state, unspecified: Secondary | ICD-10-CM | POA: Diagnosis not present

## 2020-01-11 DIAGNOSIS — Z419 Encounter for procedure for purposes other than remedying health state, unspecified: Secondary | ICD-10-CM | POA: Diagnosis not present

## 2020-02-10 DIAGNOSIS — Z419 Encounter for procedure for purposes other than remedying health state, unspecified: Secondary | ICD-10-CM | POA: Diagnosis not present

## 2020-03-12 DIAGNOSIS — Z419 Encounter for procedure for purposes other than remedying health state, unspecified: Secondary | ICD-10-CM | POA: Diagnosis not present

## 2020-03-12 DIAGNOSIS — R32 Unspecified urinary incontinence: Secondary | ICD-10-CM | POA: Diagnosis not present

## 2020-11-10 DIAGNOSIS — Z419 Encounter for procedure for purposes other than remedying health state, unspecified: Secondary | ICD-10-CM | POA: Diagnosis not present

## 2020-12-10 DIAGNOSIS — Z419 Encounter for procedure for purposes other than remedying health state, unspecified: Secondary | ICD-10-CM | POA: Diagnosis not present

## 2021-01-10 DIAGNOSIS — Z419 Encounter for procedure for purposes other than remedying health state, unspecified: Secondary | ICD-10-CM | POA: Diagnosis not present

## 2021-02-09 DIAGNOSIS — Z419 Encounter for procedure for purposes other than remedying health state, unspecified: Secondary | ICD-10-CM | POA: Diagnosis not present

## 2021-03-12 DIAGNOSIS — Z419 Encounter for procedure for purposes other than remedying health state, unspecified: Secondary | ICD-10-CM | POA: Diagnosis not present

## 2021-04-12 DIAGNOSIS — Z419 Encounter for procedure for purposes other than remedying health state, unspecified: Secondary | ICD-10-CM | POA: Diagnosis not present

## 2021-05-10 DIAGNOSIS — Z419 Encounter for procedure for purposes other than remedying health state, unspecified: Secondary | ICD-10-CM | POA: Diagnosis not present

## 2021-05-29 ENCOUNTER — Encounter (HOSPITAL_COMMUNITY): Payer: Self-pay

## 2021-05-29 ENCOUNTER — Inpatient Hospital Stay (HOSPITAL_COMMUNITY)
Admission: EM | Admit: 2021-05-29 | Discharge: 2021-06-05 | DRG: 853 | Disposition: A | Discharge status: Home / Self Care | Payer: 59 | Attending: Internal Medicine | Admitting: Internal Medicine

## 2021-05-29 ENCOUNTER — Emergency Department (HOSPITAL_COMMUNITY): Payer: 59

## 2021-05-29 DIAGNOSIS — R159 Full incontinence of feces: Secondary | ICD-10-CM | POA: Diagnosis not present

## 2021-05-29 DIAGNOSIS — L97429 Non-pressure chronic ulcer of left heel and midfoot with unspecified severity: Secondary | ICD-10-CM | POA: Diagnosis present

## 2021-05-29 DIAGNOSIS — A419 Sepsis, unspecified organism: Secondary | ICD-10-CM | POA: Diagnosis present

## 2021-05-29 DIAGNOSIS — L02415 Cutaneous abscess of right lower limb: Secondary | ICD-10-CM | POA: Diagnosis present

## 2021-05-29 DIAGNOSIS — M86679 Other chronic osteomyelitis, unspecified ankle and foot: Secondary | ICD-10-CM

## 2021-05-29 DIAGNOSIS — M65852 Other synovitis and tenosynovitis, left thigh: Secondary | ICD-10-CM | POA: Diagnosis present

## 2021-05-29 DIAGNOSIS — D509 Iron deficiency anemia, unspecified: Secondary | ICD-10-CM | POA: Diagnosis present

## 2021-05-29 DIAGNOSIS — Z87891 Personal history of nicotine dependence: Secondary | ICD-10-CM

## 2021-05-29 DIAGNOSIS — L732 Hidradenitis suppurativa: Secondary | ICD-10-CM | POA: Diagnosis present

## 2021-05-29 DIAGNOSIS — M86272 Subacute osteomyelitis, left ankle and foot: Secondary | ICD-10-CM | POA: Diagnosis present

## 2021-05-29 DIAGNOSIS — Z993 Dependence on wheelchair: Secondary | ICD-10-CM

## 2021-05-29 DIAGNOSIS — G8220 Paraplegia, unspecified: Secondary | ICD-10-CM | POA: Diagnosis present

## 2021-05-29 DIAGNOSIS — Z20822 Contact with and (suspected) exposure to covid-19: Secondary | ICD-10-CM | POA: Diagnosis present

## 2021-05-29 DIAGNOSIS — Z6820 Body mass index (BMI) 20.0-20.9, adult: Secondary | ICD-10-CM

## 2021-05-29 DIAGNOSIS — S91002A Unspecified open wound, left ankle, initial encounter: Secondary | ICD-10-CM | POA: Diagnosis not present

## 2021-05-29 DIAGNOSIS — M009 Pyogenic arthritis, unspecified: Secondary | ICD-10-CM | POA: Diagnosis present

## 2021-05-29 DIAGNOSIS — L03116 Cellulitis of left lower limb: Secondary | ICD-10-CM | POA: Diagnosis present

## 2021-05-29 DIAGNOSIS — J45909 Unspecified asthma, uncomplicated: Secondary | ICD-10-CM | POA: Diagnosis present

## 2021-05-29 DIAGNOSIS — E872 Acidosis, unspecified: Secondary | ICD-10-CM | POA: Diagnosis present

## 2021-05-29 DIAGNOSIS — D72829 Elevated white blood cell count, unspecified: Secondary | ICD-10-CM

## 2021-05-29 DIAGNOSIS — M869 Osteomyelitis, unspecified: Secondary | ICD-10-CM | POA: Diagnosis not present

## 2021-05-29 DIAGNOSIS — L304 Erythema intertrigo: Secondary | ICD-10-CM | POA: Diagnosis present

## 2021-05-29 DIAGNOSIS — L89312 Pressure ulcer of right buttock, stage 2: Secondary | ICD-10-CM | POA: Diagnosis present

## 2021-05-29 DIAGNOSIS — N319 Neuromuscular dysfunction of bladder, unspecified: Secondary | ICD-10-CM | POA: Diagnosis present

## 2021-05-29 DIAGNOSIS — K626 Ulcer of anus and rectum: Secondary | ICD-10-CM | POA: Diagnosis present

## 2021-05-29 DIAGNOSIS — M65172 Other infective (teno)synovitis, left ankle and foot: Secondary | ICD-10-CM | POA: Diagnosis not present

## 2021-05-29 DIAGNOSIS — R339 Retention of urine, unspecified: Secondary | ICD-10-CM | POA: Diagnosis present

## 2021-05-29 DIAGNOSIS — E43 Unspecified severe protein-calorie malnutrition: Secondary | ICD-10-CM | POA: Diagnosis present

## 2021-05-29 DIAGNOSIS — D75839 Thrombocytosis, unspecified: Secondary | ICD-10-CM | POA: Diagnosis present

## 2021-05-29 DIAGNOSIS — L8990 Pressure ulcer of unspecified site, unspecified stage: Secondary | ICD-10-CM | POA: Insufficient documentation

## 2021-05-29 DIAGNOSIS — Z981 Arthrodesis status: Secondary | ICD-10-CM | POA: Diagnosis not present

## 2021-05-29 DIAGNOSIS — E876 Hypokalemia: Secondary | ICD-10-CM | POA: Diagnosis present

## 2021-05-29 DIAGNOSIS — Z79899 Other long term (current) drug therapy: Secondary | ICD-10-CM

## 2021-05-29 DIAGNOSIS — J189 Pneumonia, unspecified organism: Secondary | ICD-10-CM | POA: Diagnosis present

## 2021-05-29 DIAGNOSIS — M00872 Arthritis due to other bacteria, left ankle and foot: Secondary | ICD-10-CM | POA: Diagnosis not present

## 2021-05-29 LAB — CBC WITH DIFFERENTIAL/PLATELET
Abs Immature Granulocytes: 0.12 10*3/uL — ABNORMAL HIGH (ref 0.00–0.07)
Basophils Absolute: 0.1 10*3/uL (ref 0.0–0.1)
Basophils Relative: 1 %
Eosinophils Absolute: 0.2 10*3/uL (ref 0.0–0.5)
Eosinophils Relative: 1 %
HCT: 26.3 % — ABNORMAL LOW (ref 39.0–52.0)
Hemoglobin: 7 g/dL — ABNORMAL LOW (ref 13.0–17.0)
Immature Granulocytes: 1 %
Lymphocytes Relative: 10 %
Lymphs Abs: 2.1 10*3/uL (ref 0.7–4.0)
MCH: 18.5 pg — ABNORMAL LOW (ref 26.0–34.0)
MCHC: 26.6 g/dL — ABNORMAL LOW (ref 30.0–36.0)
MCV: 69.6 fL — ABNORMAL LOW (ref 80.0–100.0)
Monocytes Absolute: 1.3 10*3/uL — ABNORMAL HIGH (ref 0.1–1.0)
Monocytes Relative: 6 %
Neutro Abs: 16.7 10*3/uL — ABNORMAL HIGH (ref 1.7–7.7)
Neutrophils Relative %: 81 %
Platelets: 663 10*3/uL — ABNORMAL HIGH (ref 150–400)
RBC: 3.78 MIL/uL — ABNORMAL LOW (ref 4.22–5.81)
RDW: 20 % — ABNORMAL HIGH (ref 11.5–15.5)
WBC: 20.5 10*3/uL — ABNORMAL HIGH (ref 4.0–10.5)
nRBC: 0 % (ref 0.0–0.2)

## 2021-05-29 LAB — COMPREHENSIVE METABOLIC PANEL
ALT: 9 U/L (ref 0–44)
AST: 15 U/L (ref 15–41)
Albumin: 1.9 g/dL — ABNORMAL LOW (ref 3.5–5.0)
Alkaline Phosphatase: 76 U/L (ref 38–126)
Anion gap: 8 (ref 5–15)
BUN: 7 mg/dL (ref 6–20)
CO2: 22 mmol/L (ref 22–32)
Calcium: 7.8 mg/dL — ABNORMAL LOW (ref 8.9–10.3)
Chloride: 106 mmol/L (ref 98–111)
Creatinine, Ser: 0.45 mg/dL — ABNORMAL LOW (ref 0.61–1.24)
GFR, Estimated: >60 mL/min (ref >60)
Glucose, Bld: 89 mg/dL (ref 70–99)
Potassium: 3 mmol/L — ABNORMAL LOW (ref 3.5–5.1)
Sodium: 136 mmol/L (ref 135–145)
Total Bilirubin: 0.2 mg/dL — ABNORMAL LOW (ref 0.3–1.2)
Total Protein: 8 g/dL (ref 6.5–8.1)

## 2021-05-29 LAB — APTT: aPTT: 41 seconds — ABNORMAL HIGH (ref 24–36)

## 2021-05-29 LAB — LACTIC ACID, PLASMA
Lactic Acid, Venous: 2.5 mmol/L (ref 0.5–1.9)
Lactic Acid, Venous: 1.1 mmol/L (ref 0.5–1.9)

## 2021-05-29 LAB — RESP PANEL BY RT-PCR (FLU A&B, COVID) ARPGX2
Influenza A by PCR: NEGATIVE
Influenza B by PCR: NEGATIVE
SARS Coronavirus 2 by RT PCR: NEGATIVE

## 2021-05-29 LAB — MAGNESIUM: Magnesium: 1.9 mg/dL (ref 1.7–2.4)

## 2021-05-29 LAB — PROTIME-INR
INR: 1.3 — ABNORMAL HIGH (ref 0.8–1.2)
Prothrombin Time: 15.7 seconds — ABNORMAL HIGH (ref 11.4–15.2)

## 2021-05-29 IMAGING — MR MR ANKLE*L* WO/W CM
9 series · 39 of 40 positions shown · IV contrast (gadavist)
Comparison: None.

CLINICAL DATA: Osteomyelitis, foot wound; Osteomyelitis suspected,
ankle, no prior imaging possible osteo

EXAM:
MRI OF THE LEFT FOREFOOT WITHOUT AND WITH CONTRAST; MRI OF THE LEFT
ANKLE WITHOUT AND WITH CONTRAST
TECHNIQUE: Multiplanar, multisequence MR imaging of the left ankle and left
forefoot was performed both before and after administration of
intravenous contrast.
CONTRAST:  5mL GADAVIST GADOBUTROL 1 MMOL/ML IV SOLN

[Series 5: T2 fat-sat · oblique · left · 4.0mm · 0.70mm/px · 4 of 24 slices shown (1 of 2)]
[im 1/24]
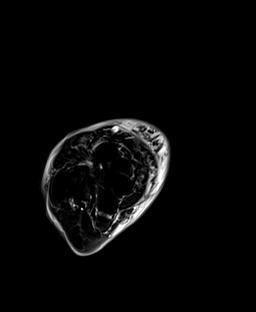
[im 8/24]
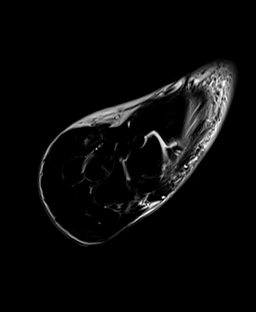
[im 16/24]
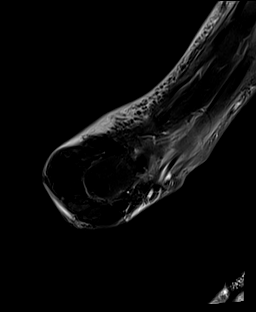
[im 24/24]
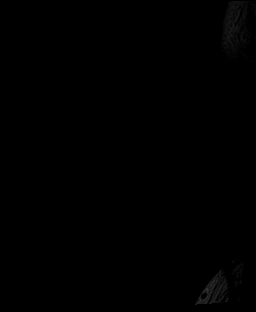

[Series 6: T2 fat-sat · oblique · left · 3.0mm · 0.50mm/px · 5 of 35 slices shown (2 of 2)]
[im 1/35]
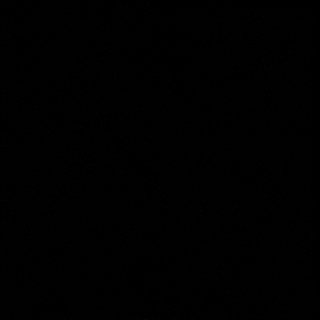
[im 9/35]
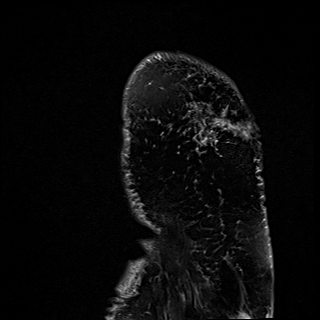
[im 18/35]
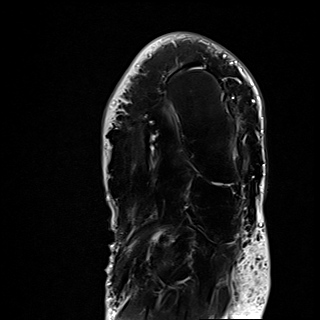
[im 26/35]
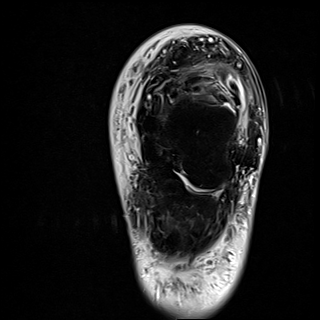
[im 35/35]
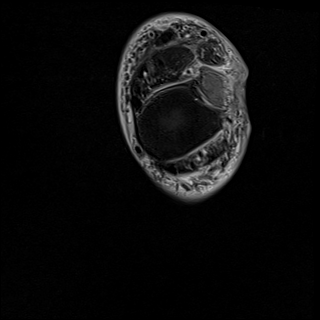

[Series 7: T1 · oblique · left · 3.0mm · 0.62mm/px · 5 of 33 slices shown (1 of 2)]
[im 1/33]
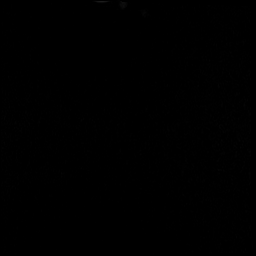
[im 9/33]
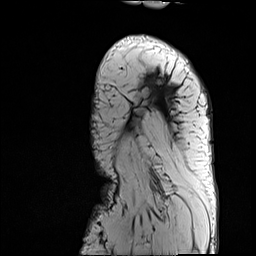
[im 17/33]
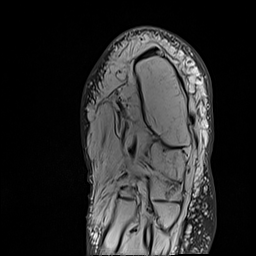
[im 25/33]
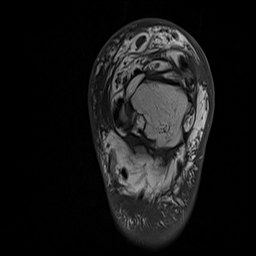
[im 33/33]
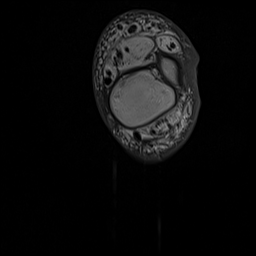

[Series 8: T1 · oblique · left · 4.0mm · 0.70mm/px · 4 of 24 slices shown (2 of 2)]
[im 1/24]
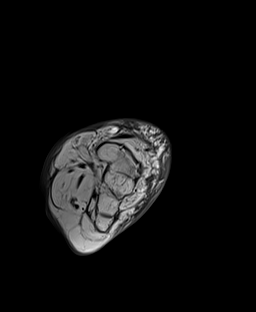
[im 8/24]
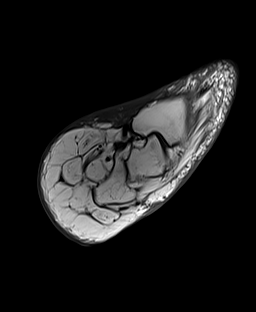
[im 16/24]
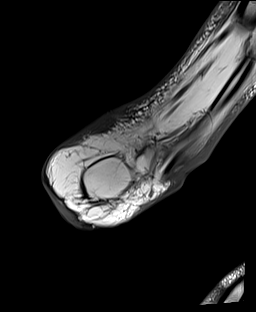
[im 24/24]
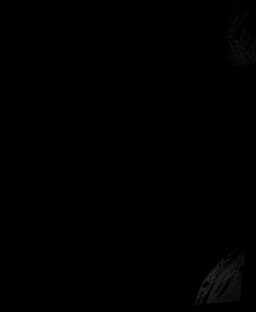

[Series 9: STIR · oblique · left · 3.0mm · 0.35mm/px · 3 of 27 slices shown]
[im 1/27]
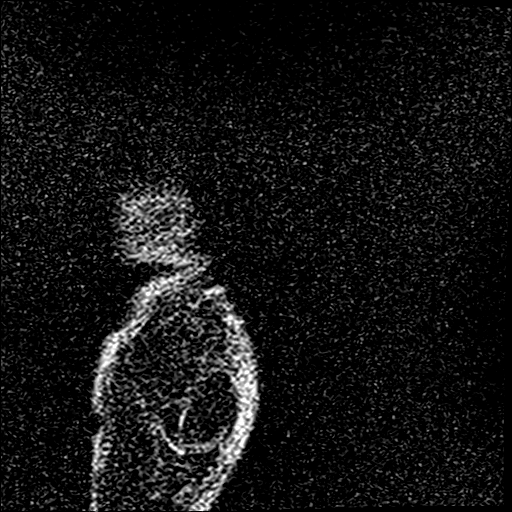
[im 9/27]
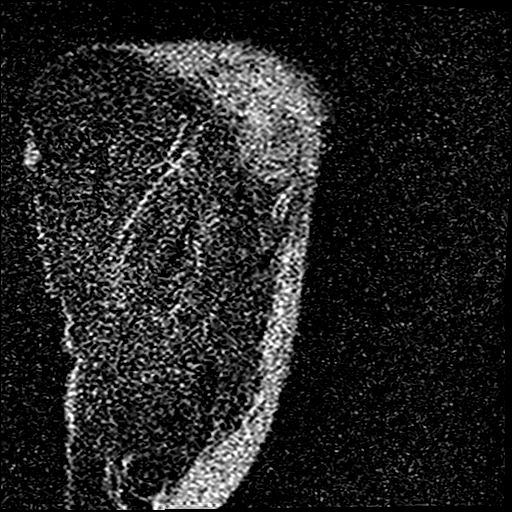
[im 18/27]
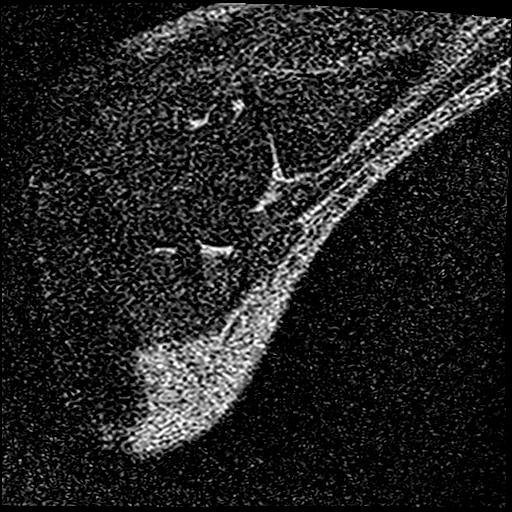

[Series 10: T1 fat-sat · oblique · non-contrast · left · 3.0mm · 0.62mm/px · 5 of 35 slices shown]
[im 1/35]
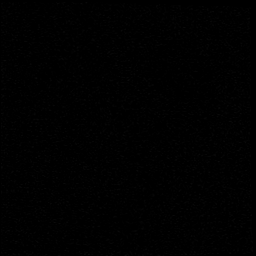
[im 9/35]
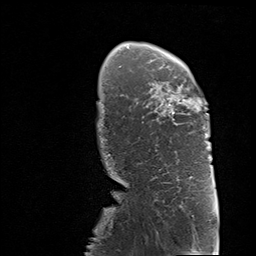
[im 18/35]
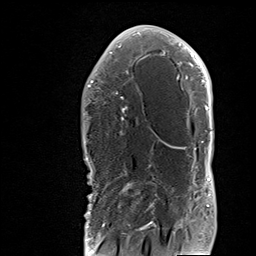
[im 26/35]
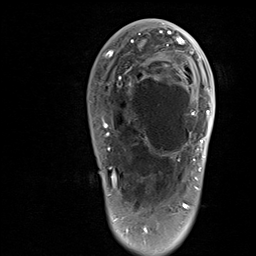
[im 35/35]
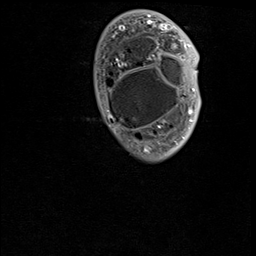

[Series 12: T1 fat-sat post-contrast · oblique · left · 3.0mm · 0.62mm/px · 5 of 33 slices shown (1 of 3)]
[im 1/33]
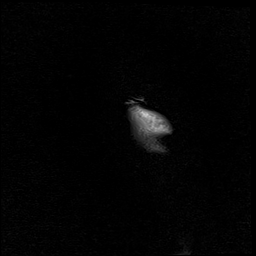
[im 9/33]
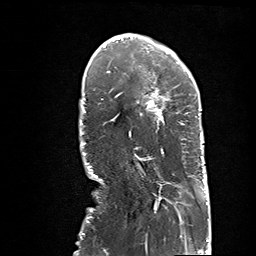
[im 17/33]
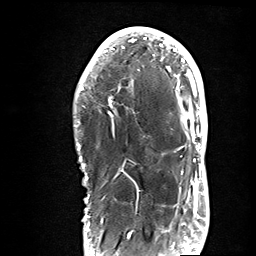
[im 25/33]
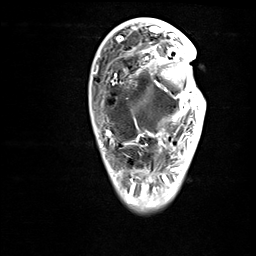
[im 33/33]
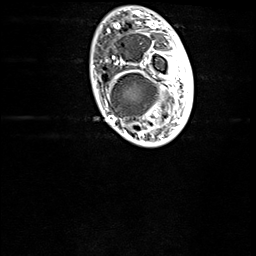

[Series 14: T1 fat-sat post-contrast · oblique · left · 4.0mm · 0.56mm/px · 4 of 24 slices shown (2 of 3)]
[im 1/24]
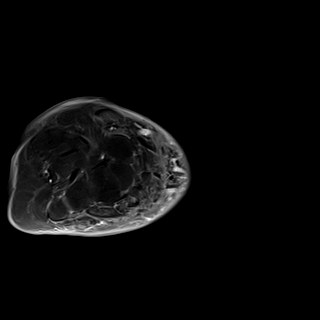
[im 8/24]
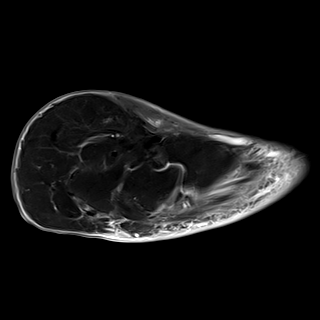
[im 16/24]
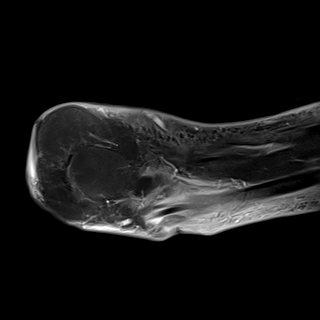
[im 24/24]
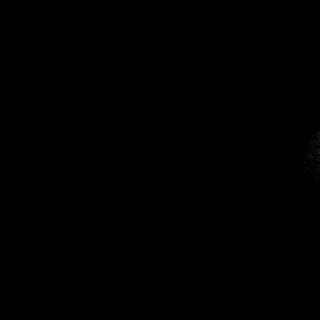

[Series 15: T1 fat-sat post-contrast · oblique · left · 3.5mm · 0.35mm/px · 4 of 27 slices shown (3 of 3)]
[im 1/27]
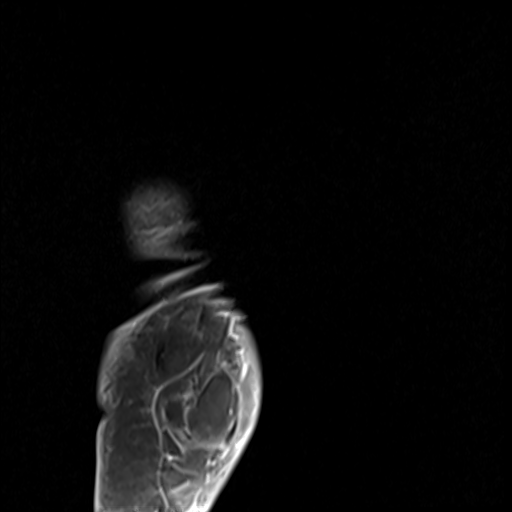
[im 9/27]
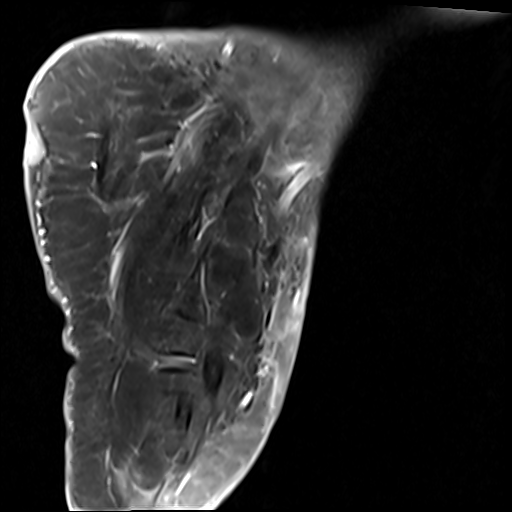
[im 18/27]
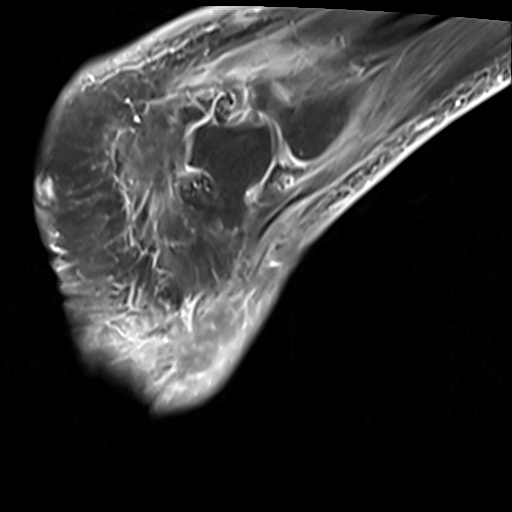
[im 27/27]
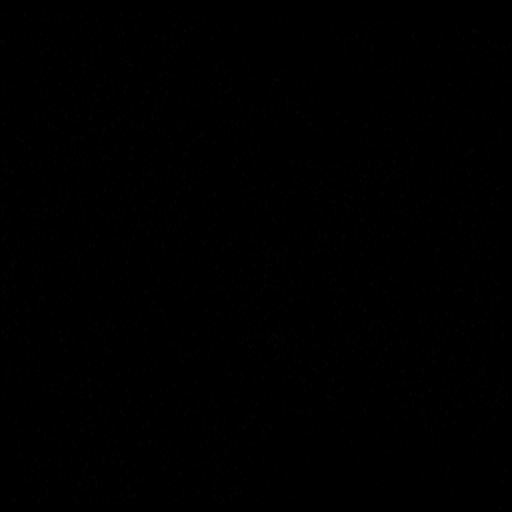

[39 of 40 positions shown; findings below may reference images not displayed]

FINDINGS: Bones/Joint/Cartilage

Bone marrow edema throughout the lateral malleolus and visualized
distal fibula with intermediate T1 marrow signal compatible with
acute osteomyelitis. Small tibiotalar joint effusion with thin
synovial enhancement, concerning for septic arthritis. No bone
marrow edema or signal abnormality evident involving the distal
tibia, talus, or calcaneus. Bones of the midfoot and forefoot
demonstrate preserved marrow signal. No fracture or dislocation.

Ligaments

No evidence of acute ligamentous injury.

Muscles and Tendons

Marked fatty atrophy of the visualized musculature suggesting
chronic denervation. Enhancing tenosynovitis involving the peroneal
tendons as they traverse the lateral malleolus. Remaining flexor and
extensor tendons appear grossly unremarkable.

Soft tissues

Deep soft tissue ulceration at the lateral ankle with ulcer base
extending to the underlying lateral malleolus. Additional soft
tissue ulceration along the plantar aspect of the hindfoot
underlying calcaneus with adjacent soft tissue edema.
Circumferential soft tissue edema, most pronounced along the dorsum
of the foot and throughout the ankle. No organized or rim enhancing
fluid collection.
IMPRESSION: 1. Deep soft tissue ulceration at the lateral ankle with ulcer base
extending to the underlying lateral malleolus. Acute osteomyelitis
of the lateral malleolus.
2. Small tibiotalar joint effusion with thin synovial enhancement,
concerning for septic arthritis.
3. Additional soft tissue ulceration along the plantar aspect of the
hindfoot underlying calcaneus without evidence for calcaneal
osteomyelitis.
4. Enhancing tenosynovitis involving the peroneal tendons as they
traverse the lateral malleolus, likely infectious.
5. Diffuse soft tissue swelling concerning for cellulitis. No
organized or rim-enhancing fluid collection.
6. Marked fatty atrophy of the visualized musculature suggesting
chronic denervation.

## 2021-05-29 IMAGING — MR MR FOOT*L* WO/W CM
9 series · 36 of 40 positions shown · IV contrast (gadavist)
Comparison: None.

CLINICAL DATA: Osteomyelitis, foot wound; Osteomyelitis suspected,
ankle, no prior imaging possible osteo

EXAM:
MRI OF THE LEFT FOREFOOT WITHOUT AND WITH CONTRAST; MRI OF THE LEFT
ANKLE WITHOUT AND WITH CONTRAST
TECHNIQUE: Multiplanar, multisequence MR imaging of the left ankle and left
forefoot was performed both before and after administration of
intravenous contrast.
CONTRAST:  5mL GADAVIST GADOBUTROL 1 MMOL/ML IV SOLN

[Series 8: T2 fat-sat · axial · left · 4.0mm · 0.38mm/px · z∈[-85,+67]mm · 5 of 35 slices shown (1 of 2)]
[im 1/35]
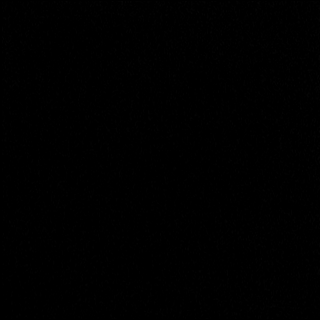
[im 9/35]
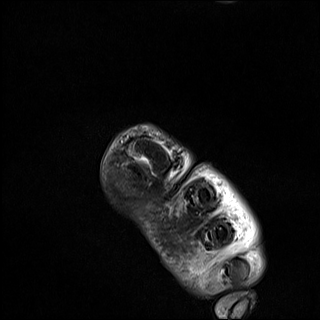
[im 18/35]
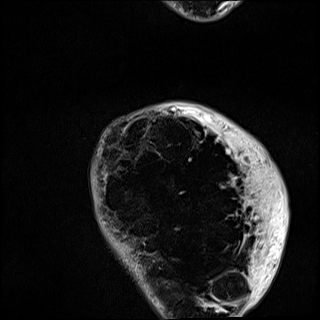
[im 26/35]
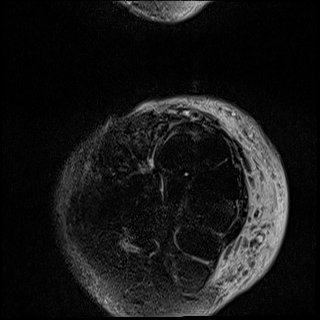
[im 35/35]
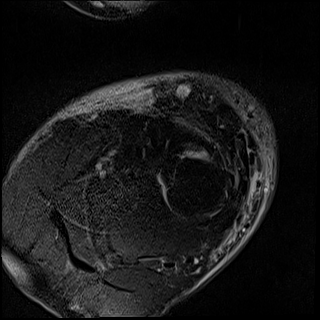

[Series 9: T1 · axial · left · 3.5mm · 0.47mm/px · z∈[-104,+28]mm · 6 of 34 slices shown (1 of 2)]
[im 1/34]
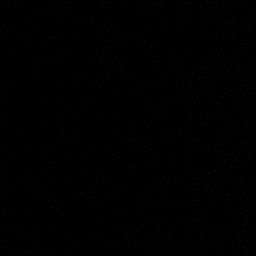
[im 7/34]
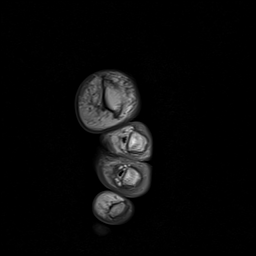
[im 14/34]
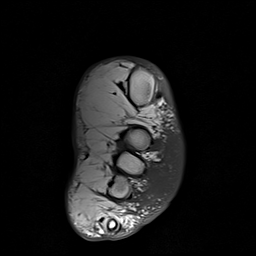
[im 20/34]
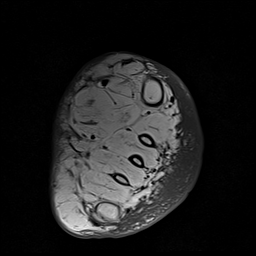
[im 27/34]
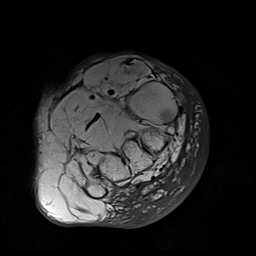
[im 34/34]
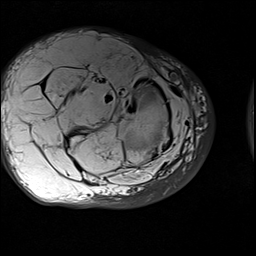

[Series 10: T2 fat-sat · sagittal · left · 3.5mm · 0.70mm/px · 4 of 24 slices shown (2 of 2)]
[im 1/24]
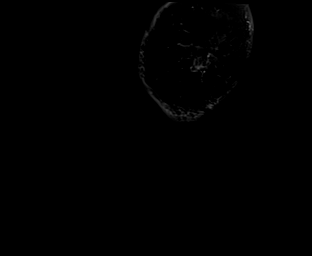
[im 8/24]
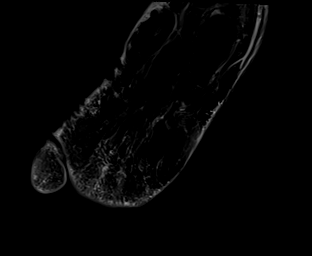
[im 16/24]
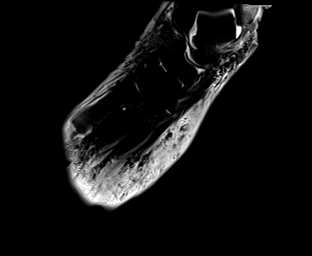
[im 24/24]
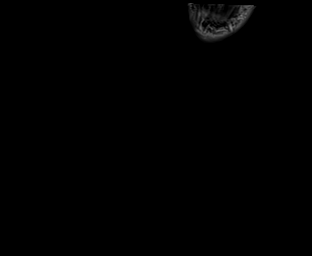

[Series 11: T1 · sagittal · left · 3.5mm · 0.70mm/px · 4 of 24 slices shown (2 of 2)]
[im 1/24]
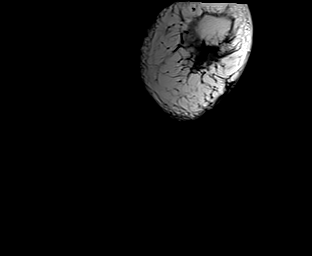
[im 8/24]
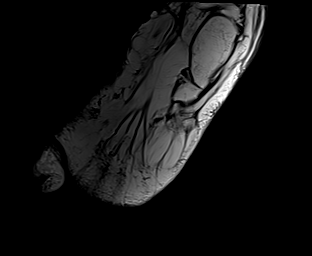
[im 16/24]
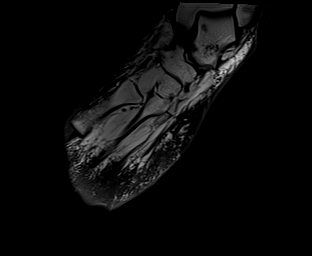
[im 24/24]
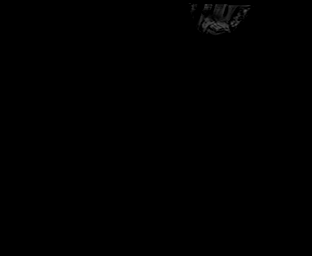

[Series 12: STIR · coronal · left · 3.5mm · 0.35mm/px · 1 of 27 slices shown]
[im 1/27]
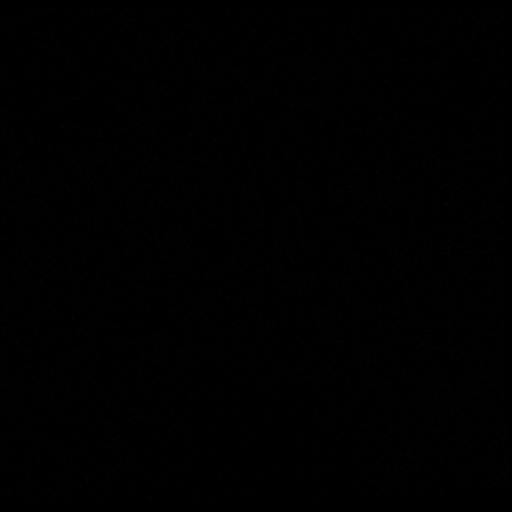

[Series 13: T1 fat-sat · axial · non-contrast · left · 3.5mm · 0.47mm/px · z∈[-92,+28]mm · 5 of 32 slices shown]
[im 1/32]
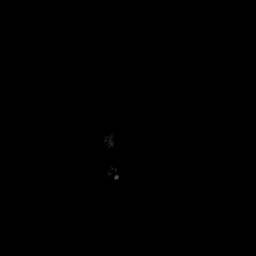
[im 8/32]
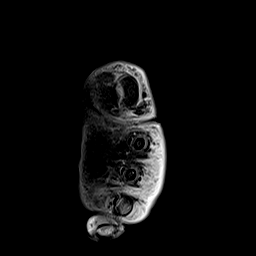
[im 16/32]
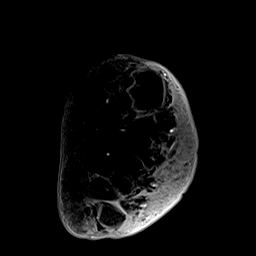
[im 24/32]
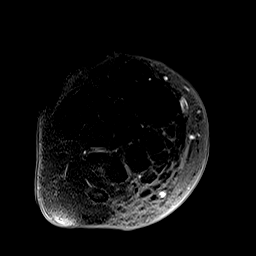
[im 32/32]
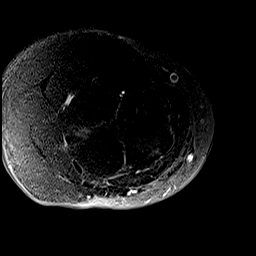

[Series 15: T1 fat-sat post-contrast · coronal · left · 3.5mm · 0.35mm/px · 2 of 9 slices shown (1 of 3)]
[im 1/9]
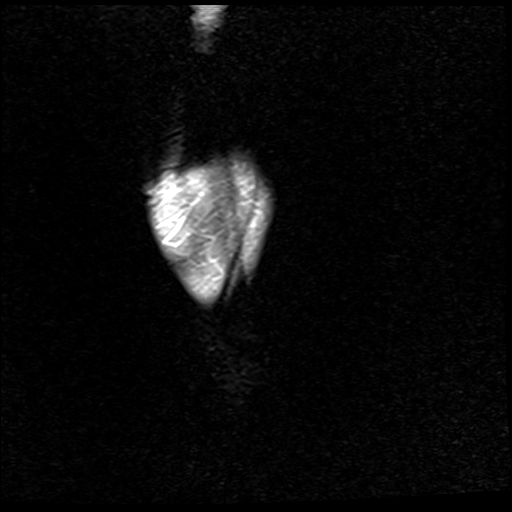
[im 9/9]
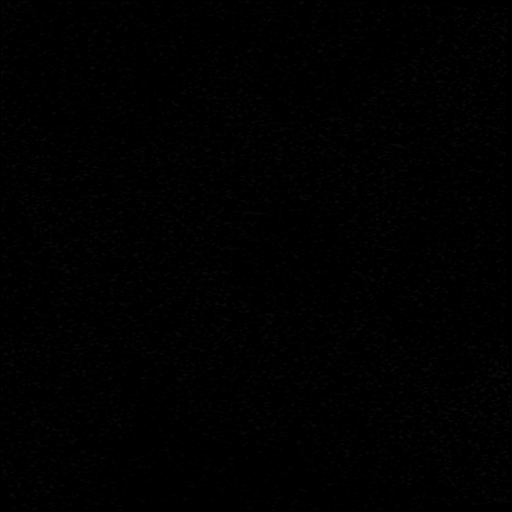

[Series 16: T1 fat-sat post-contrast · sagittal · left · 3.5mm · 0.56mm/px · 4 of 22 slices shown (2 of 3)]
[im 1/22]
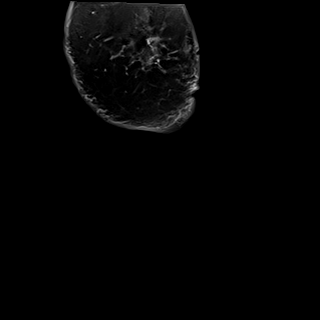
[im 8/22]
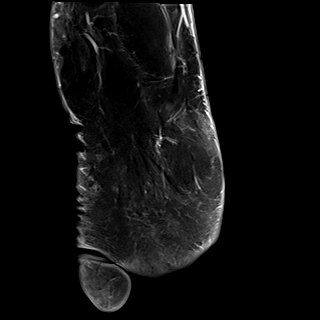
[im 15/22]
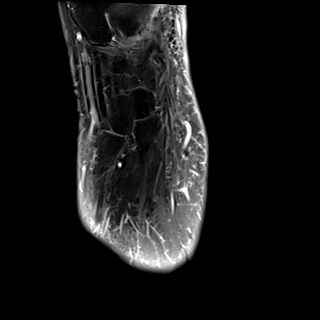
[im 22/22]
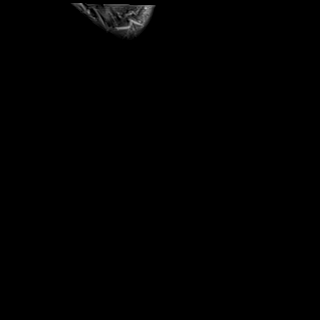

[Series 17: T1 fat-sat post-contrast · coronal · left · 3.5mm · 0.35mm/px · 5 of 27 slices shown (3 of 3)]
[im 1/27]
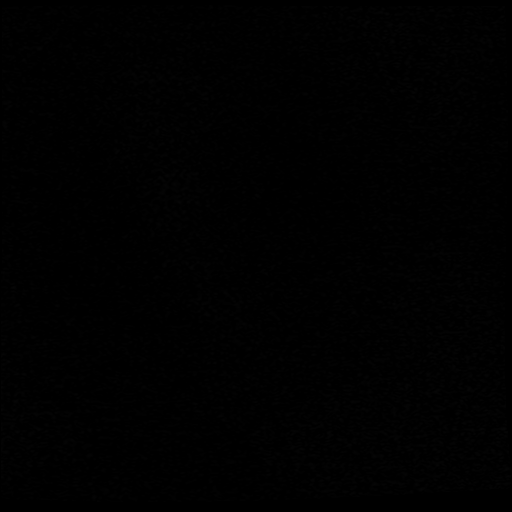
[im 7/27]
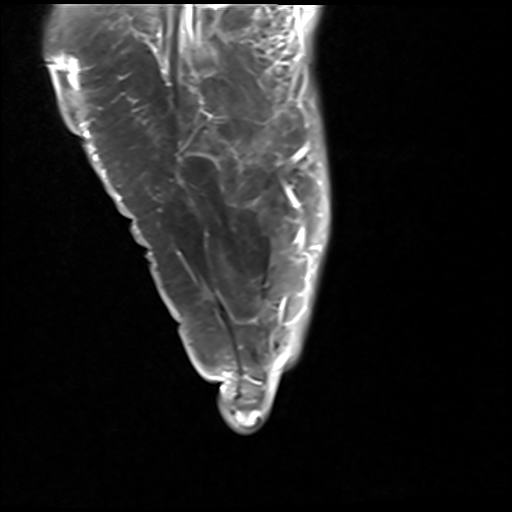
[im 14/27]
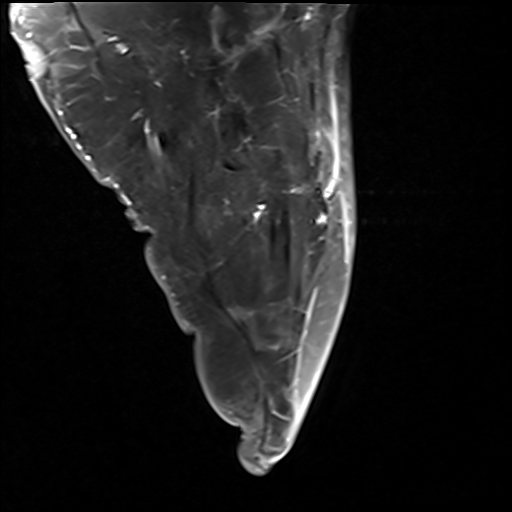
[im 20/27]
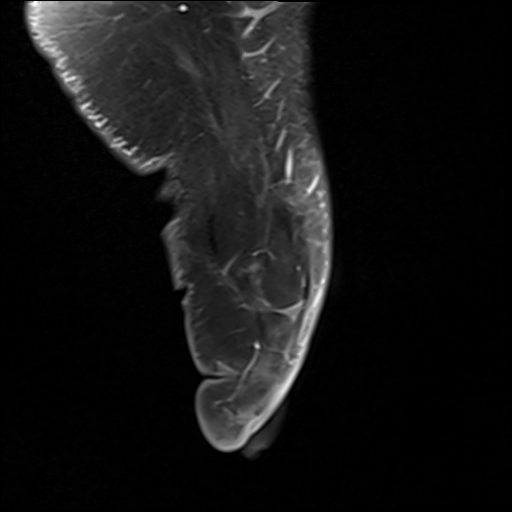
[im 27/27]
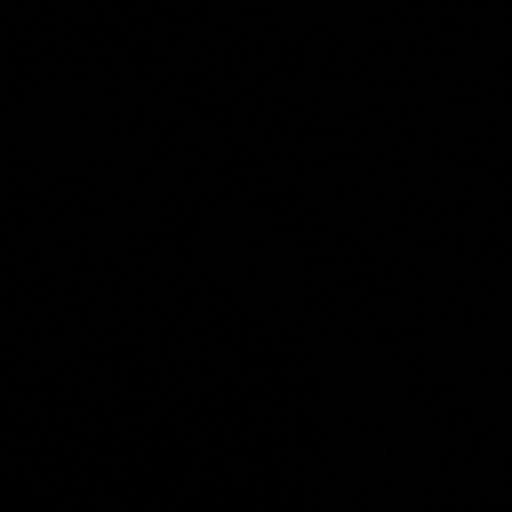

[36 of 40 positions shown; findings below may reference images not displayed]

FINDINGS: Bones/Joint/Cartilage

Bone marrow edema throughout the lateral malleolus and visualized
distal fibula with intermediate T1 marrow signal compatible with
acute osteomyelitis. Small tibiotalar joint effusion with thin
synovial enhancement, concerning for septic arthritis. No bone
marrow edema or signal abnormality evident involving the distal
tibia, talus, or calcaneus. Bones of the midfoot and forefoot
demonstrate preserved marrow signal. No fracture or dislocation.

Ligaments

No evidence of acute ligamentous injury.

Muscles and Tendons

Marked fatty atrophy of the visualized musculature suggesting
chronic denervation. Enhancing tenosynovitis involving the peroneal
tendons as they traverse the lateral malleolus. Remaining flexor and
extensor tendons appear grossly unremarkable.

Soft tissues

Deep soft tissue ulceration at the lateral ankle with ulcer base
extending to the underlying lateral malleolus. Additional soft
tissue ulceration along the plantar aspect of the hindfoot
underlying calcaneus with adjacent soft tissue edema.
Circumferential soft tissue edema, most pronounced along the dorsum
of the foot and throughout the ankle. No organized or rim enhancing
fluid collection.
IMPRESSION: 1. Deep soft tissue ulceration at the lateral ankle with ulcer base
extending to the underlying lateral malleolus. Acute osteomyelitis
of the lateral malleolus.
2. Small tibiotalar joint effusion with thin synovial enhancement,
concerning for septic arthritis.
3. Additional soft tissue ulceration along the plantar aspect of the
hindfoot underlying calcaneus without evidence for calcaneal
osteomyelitis.
4. Enhancing tenosynovitis involving the peroneal tendons as they
traverse the lateral malleolus, likely infectious.
5. Diffuse soft tissue swelling concerning for cellulitis. No
organized or rim-enhancing fluid collection.
6. Marked fatty atrophy of the visualized musculature suggesting
chronic denervation.

## 2021-05-29 IMAGING — DX DG CHEST 1V PORT
1 series · 1 of 1 positions shown · non-contrast
Comparison: None.

CLINICAL DATA: Possible sepsis

EXAM:
PORTABLE CHEST 1 VIEW

[chest ap]
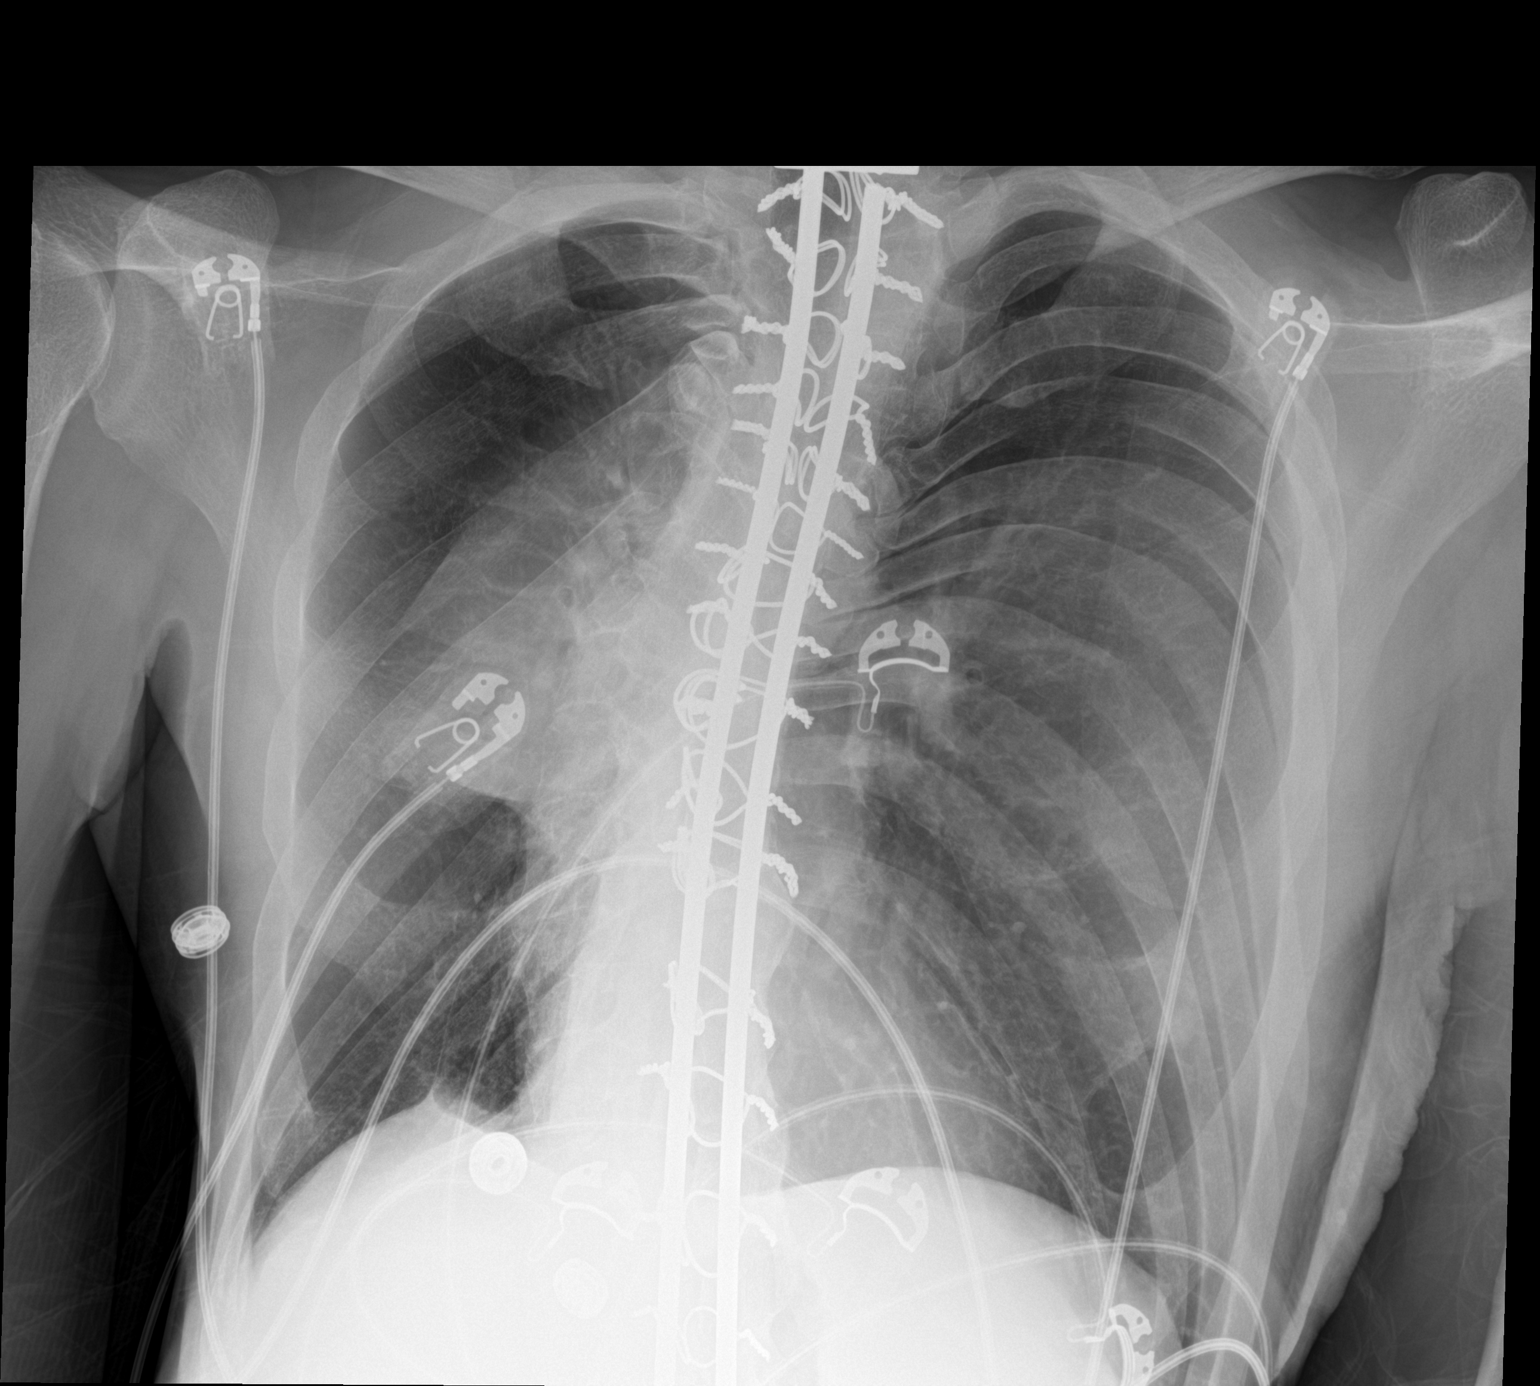

[1 of 1 positions shown; findings below may reference images not displayed]

FINDINGS: Cardiac size is within normal limits. There is homogeneous opacity
in the medial right upper lung fields. There is faint haziness in
the left parahilar region. There is no significant pleural effusion
or pneumothorax. Harrington rods are seen in the thoracic spine.
IMPRESSION: There is homogeneous opacity in the medial right upper lung fields
suggesting atelectasis/pneumonia in the right upper lobe. There is
no pleural effusion.

Faint haziness seen in the left parahilar region may be due to chest
wall attenuation. Less likely possibility would be layering of small
left pleural effusion or pleural thickening.

## 2021-05-29 MED ORDER — LACTATED RINGERS IV BOLUS (SEPSIS): 1000.0000 mL | Freq: Once | INTRAVENOUS | Status: DC

## 2021-05-29 MED ORDER — GADOBUTROL 1 MMOL/ML IV SOLN
5.0000 mL | Freq: Once | INTRAVENOUS | Status: AC | PRN
  Administered 2021-05-29: 5 mL via INTRAVENOUS

## 2021-05-29 MED ORDER — LACTATED RINGERS IV SOLN: INTRAVENOUS | Status: DC

## 2021-05-29 MED ORDER — LACTATED RINGERS IV BOLUS (SEPSIS)
1000.0000 mL | Freq: Once | INTRAVENOUS | Status: AC
  Administered 2021-05-29: 1000 mL via INTRAVENOUS

## 2021-05-29 MED ORDER — METRONIDAZOLE 500 MG/100ML IV SOLN
500.0000 mg | Freq: Once | INTRAVENOUS | Status: AC
  Administered 2021-05-29: 500 mg via INTRAVENOUS
  Filled 2021-05-29: qty 100

## 2021-05-29 MED ORDER — SODIUM CHLORIDE 0.9 % IV SOLN
2.0000 g | Freq: Once | INTRAVENOUS | Status: AC
  Administered 2021-05-29: 2 g via INTRAVENOUS
  Filled 2021-05-29: qty 2

## 2021-05-29 MED ORDER — POTASSIUM CHLORIDE CRYS ER 20 MEQ PO TBCR
40.0000 meq | EXTENDED_RELEASE_TABLET | Freq: Once | ORAL | Status: AC
Start: 2021-05-29 — End: 2021-05-29
  Administered 2021-05-29: 40 meq via ORAL
  Filled 2021-05-29: qty 2

## 2021-05-29 MED ORDER — VANCOMYCIN HCL 750 MG/150ML IV SOLN
750.0000 mg | Freq: Two times a day (BID) | INTRAVENOUS | Status: DC
  Administered 2021-05-30 – 2021-06-01 (×5): 750 mg via INTRAVENOUS
  Filled 2021-05-29 (×6): qty 150

## 2021-05-29 MED ORDER — LACTATED RINGERS IV BOLUS
1000.0000 mL | Freq: Once | INTRAVENOUS | Status: AC
  Administered 2021-05-29: 1000 mL via INTRAVENOUS

## 2021-05-29 MED ORDER — SODIUM CHLORIDE 0.9 % IV SOLN
2.0000 g | Freq: Three times a day (TID) | INTRAVENOUS | Status: DC
  Administered 2021-05-29 – 2021-05-31 (×6): 2 g via INTRAVENOUS
  Filled 2021-05-29 (×8): qty 2

## 2021-05-29 MED ORDER — VANCOMYCIN HCL IN DEXTROSE 1-5 GM/200ML-% IV SOLN
1000.0000 mg | Freq: Once | INTRAVENOUS | Status: AC
  Administered 2021-05-29: 1000 mg via INTRAVENOUS
  Filled 2021-05-29: qty 200

## 2021-05-29 MED ORDER — SODIUM CHLORIDE 0.9 % IV SOLN: 2.0000 g | Freq: Three times a day (TID) | INTRAVENOUS | Status: DC
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
## 2021-05-29 NOTE — ED Provider Notes (Signed)
?[facility] [place] DEPT ?Provider Note ? ? ?CSN: [id number] ?Arrival date & time: 05/29/21  1238 ? ?  ? ?History ? ?Chief Complaint  ?Patient presents with  ? Wound Check  ?  Possible infection. Left foot.   ? ? ?Lee Reilly is a 36 y.o. male. ? ? ?Wound Check ?Patient presents for worsening wound.  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  Despite this, he does live independently.  He is able to transfer on his own.  He is able to drive.  He reports that he had a pressure ulcer on his left lateral malleolus 12 years ago that subsequently healed.  Recently, this area has again shown evidence of a wound.  This wound has had foul-smelling purulent drainage.  He denies any fevers.  He does state that he has had chills but feels that that is baseline for him.  He denies any respiratory symptoms.  He has been eating and drinking normally.  At baseline, he has no sensation below the level of T4. ? ?  ? ?Home Medications ?Prior to Admission medications   ?Medication Sig Start Date End Date Taking? Authorizing Provider  ?acetaminophen (TYLENOL) 325 MG tablet Take 325-650 mg by mouth every 6 (six) hours as needed for mild pain or headache.   Yes [provider]  ?finasteride (PROSCAR) 5 MG tablet Take 5 mg by mouth daily.   Yes [provider]  ?Minoxidil (ROGAINE MENS EXTRA STRENGTH EX) Apply 1 application. topically in the morning and at bedtime.   Yes [provider]  ?AMBULATORY NON FORMULARY MEDICATION Extra Seat Cushion for Wheelchair. 11/23/10   Colon Branch, MD  ?Misc. Devices [facility] CUSHION) MISC Use as directed with wheelchair. 12/25/11   Colon Branch, MD  ?   ? ?Allergies    ?Patient has no known allergies.   ? ?Review of Systems   ?Review of Systems  ?Constitutional:  Positive for chills.  ?Skin:  Positive for wound.  ?All other systems  reviewed and are negative. ? ?Physical Exam ?Updated Vital Signs ?BP (!) 92/47 (BP Location: Right Arm)   Pulse (!) 111   Temp 99.6 ?F (37.6 ?C) (Oral)   Resp (!) 30   Ht '5\' 3"'$  (1.6 m)   Wt 54.4 kg   SpO2 98%   BMI 21.26 kg/m?  ?Physical Exam ?Vitals and nursing note reviewed.  ?Constitutional:   ?   General: He is not in acute distress. ?   Appearance: He is well-developed. He is not ill-appearing, toxic-appearing or diaphoretic.  ?HENT:  ?   Head: Normocephalic and atraumatic.  ?   Right Ear: External ear normal.  ?   Left Ear: External ear normal.  ?   Nose: Nose normal.  ?   Mouth/Throat:  ?   Mouth: Mucous membranes are moist.  ?   Pharynx: Oropharynx is clear.  ?Eyes:  ?   Extraocular Movements: Extraocular movements intact.  ?   Conjunctiva/sclera: Conjunctivae normal.  ?Cardiovascular:  ?   Rate and Rhythm: Regular rhythm. Tachycardia present.  ?   Heart sounds: No murmur heard. ?Pulmonary:  ?   Effort: Pulmonary effort is normal. No respiratory distress.  ?   Breath sounds: Normal breath sounds. No wheezing or rales.  ?Chest:  ?   Chest wall: No tenderness.  ?Abdominal:  ?   Palpations: Abdomen is soft.  ?  Tenderness: There is no abdominal tenderness.  ?Musculoskeletal:     ?   General: No tenderness.  ?   Cervical back: Normal range of motion and neck supple.  ?   Right lower leg: Edema present.  ?   Left lower leg: Edema present.  ?Skin: ?   General: Skin is warm and dry.  ?   Capillary Refill: Capillary refill takes less than 2 seconds.  ?   Comments: Pressure ulcer to left lateral malleolus, blister to left calcaneus appears to have drained.  No current drainage is present.  ?Neurological:  ?   General: No focal deficit present.  ?   Mental Status: He is alert and oriented to person, place, and time. Mental status is at baseline.  ?   Comments: Baseline loss of sensorimotor function below T4  ?Psychiatric:     ?   Mood and Affect: Mood normal.     ?   Behavior: Behavior normal.     ?   Thought  Content: Thought content normal.     ?   Judgment: Judgment normal.  ? ? ?ED Results / Procedures / Treatments   ?Labs ?(all labs ordered are listed, but only abnormal results are displayed) ?Labs Reviewed  ?LACTIC ACID, PLASMA - Abnormal; Notable for the following components:  ?    Result Value  ? Lactic Acid, Venous 2.5 (*)   ? All other components within normal limits  ?COMPREHENSIVE METABOLIC PANEL - Abnormal; Notable for the following components:  ? Potassium 3.0 (*)   ? Creatinine, Ser 0.45 (*)   ? Calcium 7.8 (*)   ? Albumin 1.9 (*)   ? Total Bilirubin 0.2 (*)   ? All other components within normal limits  ?CBC WITH DIFFERENTIAL/PLATELET - Abnormal; Notable for the following components:  ? WBC 20.5 (*)   ? RBC 3.78 (*)   ? Hemoglobin 7.0 (*)   ? HCT 26.3 (*)   ? MCV 69.6 (*)   ? MCH 18.5 (*)   ? MCHC 26.6 (*)   ? RDW 20.0 (*)   ? Platelets 663 (*)   ? Neutro Abs 16.7 (*)   ? Monocytes Absolute 1.3 (*)   ? Abs Immature Granulocytes 0.12 (*)   ? All other components within normal limits  ?PROTIME-INR - Abnormal; Notable for the following components:  ? Prothrombin Time 15.7 (*)   ? INR 1.3 (*)   ? All other components within normal limits  ?APTT - Abnormal; Notable for the following components:  ? aPTT 41 (*)   ? All other components within normal limits  ?PROTIME-INR - Abnormal; Notable for the following components:  ? Prothrombin Time 16.2 (*)   ? INR 1.3 (*)   ? All other components within normal limits  ?COMPREHENSIVE METABOLIC PANEL - Abnormal; Notable for the following components:  ? Sodium 133 (*)   ? Potassium 3.2 (*)   ? Glucose, Bld 102 (*)   ? Creatinine, Ser 0.43 (*)   ? Calcium 7.4 (*)   ? Albumin 1.7 (*)   ? AST 11 (*)   ? Anion gap 4 (*)   ? All other components within normal limits  ?CBC - Abnormal; Notable for the following components:  ? WBC 17.2 (*)   ? RBC 3.29 (*)   ? Hemoglobin 6.2 (*)   ? HCT 22.7 (*)   ? MCV 69.0 (*)   ? MCH 18.8 (*)   ? MCHC 27.3 (*)   ?  RDW 19.8 (*)   ? Platelets 573  (*)   ? All other components within normal limits  ?IRON AND TIBC - Abnormal; Notable for the following components:  ? Iron 11 (*)   ? TIBC 119 (*)   ? Saturation Ratios 9 (*)   ? All other components within normal limits  ?HEMOGLOBIN AND HEMATOCRIT, BLOOD - Abnormal; Notable for the following components:  ? Hemoglobin 7.4 (*)   ? HCT 25.7 (*)   ? All other components within normal limits  ?CULTURE, BLOOD (ROUTINE X 2)  ?CULTURE, BLOOD (ROUTINE X 2)  ?RESP PANEL BY RT-PCR (FLU A&B, COVID) ARPGX2  ?AEROBIC/ANAEROBIC CULTURE W GRAM STAIN (SURGICAL/DEEP WOUND)  ?URINE CULTURE  ?LACTIC ACID, PLASMA  ?MAGNESIUM  ?HIV ANTIBODY (ROUTINE TESTING W REFLEX)  ?CORTISOL-AM, BLOOD  ?PROCALCITONIN  ?LACTIC ACID, PLASMA  ?LACTIC ACID, PLASMA  ?URINALYSIS, ROUTINE W REFLEX MICROSCOPIC  ?CBC WITH DIFFERENTIAL/PLATELET  ?TYPE AND SCREEN  ?PREPARE RBC (CROSSMATCH)  ?ABO/RH  ? ? ?EKG ?EKG Interpretation ? ?Date/Time:  Monday May 29 2021 14:16:39 EDT ?Ventricular Rate:  118 ?PR Interval:  153 ?QRS Duration: 96 ?QT Interval:  316 ?QTC Calculation: 443 ?R Axis:   78 ?Text Interpretation: Sinus tachycardia Probable left atrial enlargement LVH with secondary repolarization abnormality Artifact in lead(s) I II III aVR aVL aVF V1 V2 V3 V4 Confirmed by [name] [phone]) on 05/29/2021 2:30:53 PM ? ?Radiology ?MR FOOT LEFT W WO CONTRAST ? ?Result Date: 05/29/2021 ?CLINICAL DATA:  Osteomyelitis, foot wound; Osteomyelitis suspected, ankle, no prior imaging possible osteo EXAM: MRI OF THE LEFT FOREFOOT WITHOUT AND WITH CONTRAST; MRI OF THE LEFT ANKLE WITHOUT AND WITH CONTRAST TECHNIQUE: Multiplanar, multisequence MR imaging of the left ankle and left forefoot was performed both before and after administration of intravenous contrast. CONTRAST:  66m GADAVIST GADOBUTROL 1 MMOL/ML IV SOLN COMPARISON:  None. FINDINGS: Bones/Joint/Cartilage Bone marrow edema throughout the lateral malleolus and visualized distal fibula with intermediate T1 marrow signal  compatible with acute osteomyelitis. Small tibiotalar joint effusion with thin synovial enhancement, concerning for septic arthritis. No bone marrow edema or signal abnormality evident involving the distal tibia,

## 2021-05-29 NOTE — ED Notes (Signed)
Patient transported to MRI 

## 2021-05-29 NOTE — Progress Notes (Signed)
A consult was received from an ED physician for vanc/cefepime per pharmacy dosing.  The patient's profile has been reviewed for ht/wt/allergies/indication/available labs.   ?A one time order has been placed for vancomycin 1 gm; cefepime 2 gm, Flagyl 500 mg.  ? ? Further antibiotics/pharmacy consults should be ordered by admitting physician if indicated.       ?                ? ?Eudelia Bunch, Pharm.D ?05/29/2021 3:20 PM ? ?

## 2021-05-29 NOTE — ED Provider Triage Note (Signed)
Emergency Medicine Provider Triage Evaluation Note ? ?Lee Reilly , a 36 y.o. male with history of paraplegia from T4 down was evaluated in triage.  Pt complains of wound of the left lateral ankle and left heel that he first noticed about 1 month ago.  He states the source of infection has been ongoing for about 13 years but has not been a problem for some time.  He notes that his brother is a hematologist and advised him on some wound care but it does not seem to be improving.  Patient denies fever, chills.  He otherwise feels fine ? ? ?Review of Systems  ?Positive: Wound infection ?Negative: Fever chills, fatigue ? ?Physical Exam  ?BP 101/70   Pulse (!) 130   Temp (!) 97.4 ?F (36.3 ?C) (Oral)   Resp 20   SpO2 100%  ?Gen:   Awake, no distress   ?Resp:  Normal effort  ?MSK:   Moves upper extremities without difficulty  ?Other:  Large ulcerated wound of the left lateral ankle with central eschar and malodorous, purulent drainage.  Infection seems to extend throughout the lateral foot with drainage coming out of the lateral fifth metatarsal.  Early pressure ulcer of the left bottom heel. ? ?Patient is consistently tachycardic to the 130s, initial blood pressure of 94/50 ? ?Medical Decision Making  ?Medically screening exam initiated at 2:01 PM.  Appropriate orders placed.  Lee Reilly was informed that the remainder of the evaluation will be completed by another provider, this initial triage assessment does not replace that evaluation, and the importance of remaining in the ED until their evaluation is complete. ? ?Sepsis protocol initiated ?  ?Lee Reilly, [place] ?05/29/21 1405 ? ?

## 2021-05-29 NOTE — ED Triage Notes (Signed)
Pt reports old wound from 12 years ago on left foot.  ?Reports wound has opened up again on left side of left foot and left heel. Pt reports brown liquid from wound.  ? ?Pt reports he has no sensation bellow thoracic vertebrae.  ? ?A/Ox4 ?Wheelchair in triage   ?

## 2021-05-29 NOTE — Sepsis Progress Note (Signed)
ELink monitoring sepsis protocol 

## 2021-05-29 NOTE — Progress Notes (Signed)
Pharmacy Antibiotic Note ? ?Lee Reilly is a 36 y.o. male admitted on 05/29/2021 with left ankle wound infection.  Pharmacy has been consulted for vancomycin & cefepime dosing. ? ?Plan: ?Cefepime 2 gm IV q 8h x 7 days ?Vancomycin 1 gm loading dose followed by vancomycin 750 mg IV q12 hr x 7 days for estimated AUC 449.8 using TBW 54.4 kg and SCr rounded up to 0.8 ?F/u renal fxn, WBC, temp, cultures ?Vancomycin levels as needed ? ?Height: '5\' 3"'$  (160 cm) ?Weight: 54.4 kg (120 lb) ?IBW/kg (Calculated) : 56.9 ? ?Temp (24hrs), Avg:97.4 ?F (36.3 ?C), Min:97.4 ?F (36.3 ?C), Max:97.4 ?F (36.3 ?C) ? ?Recent Labs  ?Lab 05/29/21 ?1404 05/29/21 ?1406  ?WBC  --  20.5*  ?CREATININE  --  0.45*  ?LATICACIDVEN 2.5*  --   ?  ?Estimated Creatinine Clearance: 97.3 mL/min (A) (by C-G formula based on SCr of 0.45 mg/dL (L)).   ? ?No Known Allergies ?Antimicrobials this admission:  ?3/20 vanc >> ?3/20 cefepime >> ?3/20 flagyl>> ?Dose adjustments this admission:  ? ?Microbiology results:  ?3/20 BCx2: ? ?Thank you for allowing pharmacy to be a part of this patient?s care. ? ?Eudelia Bunch, Pharm.D ?05/29/2021 5:50 PM ? ?
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
## 2021-05-30 ENCOUNTER — Encounter (HOSPITAL_COMMUNITY): Payer: Self-pay | Admitting: Internal Medicine

## 2021-05-30 ENCOUNTER — Other Ambulatory Visit: Payer: Self-pay

## 2021-05-30 DIAGNOSIS — A419 Sepsis, unspecified organism: Secondary | ICD-10-CM | POA: Diagnosis not present

## 2021-05-30 DIAGNOSIS — L8990 Pressure ulcer of unspecified site, unspecified stage: Secondary | ICD-10-CM | POA: Insufficient documentation

## 2021-05-30 LAB — LACTIC ACID, PLASMA
Lactic Acid, Venous: 0.8 mmol/L (ref 0.5–1.9)
Lactic Acid, Venous: 0.8 mmol/L (ref 0.5–1.9)

## 2021-05-30 LAB — COMPREHENSIVE METABOLIC PANEL
ALT: 8 U/L (ref 0–44)
AST: 11 U/L — ABNORMAL LOW (ref 15–41)
Albumin: 1.7 g/dL — ABNORMAL LOW (ref 3.5–5.0)
Alkaline Phosphatase: 65 U/L (ref 38–126)
Anion gap: 4 — ABNORMAL LOW (ref 5–15)
BUN: 8 mg/dL (ref 6–20)
CO2: 22 mmol/L (ref 22–32)
Calcium: 7.4 mg/dL — ABNORMAL LOW (ref 8.9–10.3)
Chloride: 107 mmol/L (ref 98–111)
Creatinine, Ser: 0.43 mg/dL — ABNORMAL LOW (ref 0.61–1.24)
GFR, Estimated: >60 mL/min (ref >60)
Glucose, Bld: 102 mg/dL — ABNORMAL HIGH (ref 70–99)
Potassium: 3.2 mmol/L — ABNORMAL LOW (ref 3.5–5.1)
Sodium: 133 mmol/L — ABNORMAL LOW (ref 135–145)
Total Bilirubin: 0.3 mg/dL (ref 0.3–1.2)
Total Protein: 6.8 g/dL (ref 6.5–8.1)

## 2021-05-30 LAB — IRON AND TIBC
Iron: 11 ug/dL — ABNORMAL LOW (ref 45–182)
Saturation Ratios: 9 % — ABNORMAL LOW (ref 17.9–39.5)
TIBC: 119 ug/dL — ABNORMAL LOW (ref 250–450)
UIBC: 108 ug/dL

## 2021-05-30 LAB — PROCALCITONIN: Procalcitonin: 0.12 ng/mL

## 2021-05-30 LAB — CBC
HCT: 22.7 % — ABNORMAL LOW (ref 39.0–52.0)
Hemoglobin: 6.2 g/dL — CL (ref 13.0–17.0)
MCH: 18.8 pg — ABNORMAL LOW (ref 26.0–34.0)
MCHC: 27.3 g/dL — ABNORMAL LOW (ref 30.0–36.0)
MCV: 69 fL — ABNORMAL LOW (ref 80.0–100.0)
Platelets: 573 10*3/uL — ABNORMAL HIGH (ref 150–400)
RBC: 3.29 MIL/uL — ABNORMAL LOW (ref 4.22–5.81)
RDW: 19.8 % — ABNORMAL HIGH (ref 11.5–15.5)
WBC: 17.2 10*3/uL — ABNORMAL HIGH (ref 4.0–10.5)
nRBC: 0 % (ref 0.0–0.2)

## 2021-05-30 LAB — HIV ANTIBODY (ROUTINE TESTING W REFLEX): HIV Screen 4th Generation wRfx: NONREACTIVE

## 2021-05-30 LAB — PROTIME-INR
INR: 1.3 — ABNORMAL HIGH (ref 0.8–1.2)
Prothrombin Time: 16.2 seconds — ABNORMAL HIGH (ref 11.4–15.2)

## 2021-05-30 LAB — ABO/RH: ABO/RH(D): B NEG

## 2021-05-30 LAB — PREPARE RBC (CROSSMATCH)

## 2021-05-30 LAB — HEMOGLOBIN AND HEMATOCRIT, BLOOD
HCT: 25.7 % — ABNORMAL LOW (ref 39.0–52.0)
Hemoglobin: 7.4 g/dL — ABNORMAL LOW (ref 13.0–17.0)

## 2021-05-30 LAB — CORTISOL-AM, BLOOD: Cortisol - AM: 8.7 ug/dL (ref 6.7–22.6)

## 2021-05-30 MED ORDER — SODIUM CHLORIDE 0.9% IV SOLUTION: Freq: Once | INTRAVENOUS | Status: AC

## 2021-05-30 MED ORDER — POTASSIUM CHLORIDE 10 MEQ/100ML IV SOLN
10.0000 meq | INTRAVENOUS | Status: AC
  Administered 2021-05-30 (×4): 10 meq via INTRAVENOUS
  Filled 2021-05-30: qty 100

## 2021-05-30 MED ORDER — ACETAMINOPHEN 650 MG RE SUPP: 650.0000 mg | Freq: Four times a day (QID) | RECTAL | Status: DC | PRN

## 2021-05-30 MED ORDER — POTASSIUM CHLORIDE CRYS ER 20 MEQ PO TBCR: 40.0000 meq | EXTENDED_RELEASE_TABLET | Freq: Every day | ORAL | Status: DC

## 2021-05-30 MED ORDER — ACETAMINOPHEN 325 MG PO TABS
650.0000 mg | ORAL_TABLET | Freq: Four times a day (QID) | ORAL | Status: DC | PRN
  Administered 2021-05-30: 650 mg via ORAL
  Filled 2021-05-30: qty 2

## 2021-05-30 MED ORDER — ADULT MULTIVITAMIN W/MINERALS CH: 1.0000 | ORAL_TABLET | Freq: Every day | ORAL | Status: DC

## 2021-05-30 MED ORDER — SODIUM CHLORIDE 0.9 % IV SOLN
510.0000 mg | Freq: Once | INTRAVENOUS | Status: AC
  Administered 2021-05-30: 510 mg via INTRAVENOUS
  Filled 2021-05-30: qty 17

## 2021-05-30 MED ORDER — GERHARDT'S BUTT CREAM
TOPICAL_CREAM | Freq: Four times a day (QID) | CUTANEOUS | Status: DC
  Filled 2021-05-30: qty 1

## 2021-05-30 MED ORDER — SODIUM CHLORIDE 0.9 % IV BOLUS
500.0000 mL | Freq: Once | INTRAVENOUS | Status: AC
  Administered 2021-05-30: 500 mL via INTRAVENOUS

## 2021-05-30 MED ORDER — CLINDAMYCIN PHOSPHATE 1 % EX GEL
Freq: Two times a day (BID) | CUTANEOUS | Status: DC
  Filled 2021-05-30 (×2): qty 30

## 2021-05-30 MED ORDER — POTASSIUM CHLORIDE 10 MEQ/100ML IV SOLN
10.0000 meq | INTRAVENOUS | Status: AC
  Administered 2021-05-30 (×3): 10 meq via INTRAVENOUS
  Filled 2021-05-30: qty 100

## 2021-05-30 MED ORDER — SODIUM CHLORIDE 0.9 % IV BOLUS
1000.0000 mL | Freq: Once | INTRAVENOUS | Status: AC
  Administered 2021-05-30: 1000 mL via INTRAVENOUS

## 2021-05-30 MED ORDER — ONDANSETRON HCL 4 MG PO TABS: 4.0000 mg | ORAL_TABLET | Freq: Four times a day (QID) | ORAL | Status: DC | PRN

## 2021-05-30 MED ORDER — CLINDAMYCIN PHOSPHATE 1 % EX SOLN
Freq: Two times a day (BID) | CUTANEOUS | Status: DC
  Administered 2021-05-30: 1 via TOPICAL
  Filled 2021-05-30: qty 30

## 2021-05-30 MED ORDER — PROSOURCE PLUS PO LIQD
30.0000 mL | Freq: Two times a day (BID) | ORAL | Status: DC
  Administered 2021-05-31 – 2021-06-05 (×5): 30 mL via ORAL
  Filled 2021-05-30 (×7): qty 30

## 2021-05-30 MED ORDER — SODIUM CHLORIDE 0.9 % IV SOLN
250.0000 mg | Freq: Once | INTRAVENOUS | Status: AC
  Administered 2021-05-30: 250 mg via INTRAVENOUS
  Filled 2021-05-30: qty 20

## 2021-05-30 MED ORDER — ONDANSETRON HCL 4 MG/2ML IJ SOLN: 4.0000 mg | Freq: Four times a day (QID) | INTRAMUSCULAR | Status: DC | PRN

## 2021-05-30 MED ORDER — JUVEN PO PACK
1.0000 | PACK | Freq: Two times a day (BID) | ORAL | Status: DC
  Administered 2021-05-31 – 2021-06-05 (×5): 1 via ORAL
  Filled 2021-05-30 (×7): qty 1

## 2021-05-30 MED ORDER — FOLIC ACID 1 MG PO TABS
1.0000 mg | ORAL_TABLET | Freq: Every day | ORAL | Status: DC
  Administered 2021-05-30 – 2021-06-05 (×6): 1 mg via ORAL
  Filled 2021-05-30 (×6): qty 1

## 2021-05-30 MED ORDER — LACTATED RINGERS IV BOLUS
500.0000 mL | Freq: Once | INTRAVENOUS | Status: AC
  Administered 2021-05-30: 500 mL via INTRAVENOUS

## 2021-05-30 MED ORDER — LACTATED RINGERS IV SOLN: INTRAVENOUS | Status: DC

## 2021-05-30 MED ORDER — METRONIDAZOLE 500 MG/100ML IV SOLN
500.0000 mg | Freq: Two times a day (BID) | INTRAVENOUS | Status: DC
  Administered 2021-05-30 – 2021-05-31 (×3): 500 mg via INTRAVENOUS
  Filled 2021-05-30 (×3): qty 100

## 2021-05-30 MED ORDER — ADULT MULTIVITAMIN W/MINERALS CH
1.0000 | ORAL_TABLET | Freq: Every day | ORAL | Status: DC
  Administered 2021-05-30 – 2021-06-05 (×6): 1 via ORAL
  Filled 2021-05-30 (×6): qty 1

## 2021-05-30 MED ORDER — FINASTERIDE 5 MG PO TABS
5.0000 mg | ORAL_TABLET | Freq: Every day | ORAL | Status: DC
  Administered 2021-05-30 – 2021-06-05 (×6): 5 mg via ORAL
  Filled 2021-05-30 (×6): qty 1

## 2021-05-30 NOTE — Progress Notes (Signed)
Brief podiatry note ? ?Patient visited at bedside early this morning to discuss MRI results.  Patient admits that he had fever but denies any other complaints at this time.  Discussed with patient MRI results which reveals a much more extensive infection than anticipated involving the Left tibiotalar joint with septic arthritis and osteomyelitis of the fibula and calcaneus.  Discussed with patient that I will defer care to orthopedics because at this point since the infection is extensive limb salvage may not be an option.  Patient expressed understanding and thanked me for coming by to review MRI results with him this morning. ? ?Podiatry will sign off.  Re-consult if there are any new problems or issues that may arise. ? ?Dr. Landis Martins ?On Call provider  ?[facility] ?[phone] office ?[phone] cell ?Available via secure chat   ?

## 2021-05-30 NOTE — Progress Notes (Signed)
Initial Nutrition Assessment ? ?DOCUMENTATION CODES:  ? ?Not applicable ? ?INTERVENTION:  ?- Encourage PO intake ? ?- 1 packet Juven BID, each packet provides 95 calories, 2.5 grams of protein (collagen), and 9.8 grams of carbohydrate (3 grams sugar); also contains 7 grams of L-arginine and L-glutamine, 300 mg vitamin C,  ? ?- 30 ml ProSource Plus BID, each supplement provides 100 kcals and 15 grams protein.  ? ?- MVI with minerals daily ? ?NUTRITION DIAGNOSIS:  ? ?Increased nutrient needs related to wound healing as evidenced by estimated needs. ? ?GOAL:  ? ?Patient will meet greater than or equal to 90% of their needs ? ?MONITOR:  ? ?PO intake, Supplement acceptance, Labs, Weight trends, Skin ? ?REASON FOR ASSESSMENT:  ? ?Consult ?Assessment of nutrition requirement/status ? ?ASSESSMENT:  ? ?Pt admitted with sepsis secondary to worsening of chronic L ankle wound. PMH significant for paraplegia, neurogenic bladder ? ?Per Podiatry, MRI findings of L tibiotalar joint with septic arthritis and osteomyelitis of the fibula and calcaneus. Noted plans by MD and orthopedics, for transfer to [facility] for management.  ? ?Pt's brother present during RD visit. He was ordering lunch for pt during visit consisting of salmon, mashed potatoes, greens and water. Pt states that he primarily eats vegetarian with some fish. He typically has foods such as potatoes, pot pie, veggie burgers, fish, bananas, chips and V8 juice. He does not drink protein shakes or take vitamins at home. Pt denies any changes to his recent PO intake or appetite and is eating at his baseline. He did not want to try Ensure at this time but is agreeable to [facility] for wound healing and ProSource for additional protein supplementation.  ? ?Pt reports a usual weight of 110-120 lbs and denies recent weight loss. Reviewed weight history. There is limited documentation but his current weight is noted to be 120 lbs.  ? ?Suspect a degree of malnutrition may be present but  unable to diagnose at this time.  ? ?Medications: folvite, IV abx, LR at 281m/hr, KCl ? ?Labs:  sodim 133, potassium 3.2, Cr 0.43, AST 11, iron 11 ? ?NUTRITION - FOCUSED PHYSICAL EXAM: ?Unable to perform complete NFPE as pt laying on his side and with IV lines in upper arm not allowing him to move. Noted mild orbital, buccal, temple depletions and moderate clavicle and acromion on R side. Will attempt to obtain more detailed NFPE at follow up. ? ?Diet Order:   ?Diet Order   ? ?       ?  Diet regular Room service appropriate? Yes; Fluid consistency: Thin  Diet effective now       ?  ? ?  ?  ? ?  ? ? ?EDUCATION NEEDS:  ? ?Education needs have been addressed ? ?Skin:  Skin Assessment: Skin Integrity Issues: ?Skin Integrity Issues:: Other (Comment), Stage II ?Stage II: R buttock ?Other: non-pressure wound L ankle; MASD- anus, L/R scrotum ? ?Last BM:  3/21 (type 4) ? ?Height:  ? ?Ht Readings from Last 1 Encounters:  ?05/29/21 '5\' 3"'$  (1.6 m)  ? ? ?Weight:  ? ?Wt Readings from Last 1 Encounters:  ?05/29/21 54.4 kg  ? ?BMI:  Body mass index is 21.26 kg/m?. ? ?Estimated Nutritional Needs:  ? ?Kcal:  1700-1900 ? ?Protein:  85-100g ? ?Fluid:  >/=1.7L ? ?AClayborne Dana RDN, LDN ?Clinical Nutrition ?
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
## 2021-05-30 NOTE — Progress Notes (Signed)
?   05/30/21 0240  ?Assess: MEWS Score  ?Temp 100 ?F (37.8 ?C)  ?BP (!) 86/43  ?Pulse Rate (!) 113  ?ECG Heart Rate (!) 112  ?Resp (!) 35  ?Level of Consciousness Alert  ?SpO2 98 %  ?O2 Device Room Air  ?Assess: MEWS Score  ?MEWS Temp 0  ?MEWS Systolic 1  ?MEWS Pulse 2  ?MEWS RR 2  ?MEWS LOC 0  ?MEWS Score 5  ?MEWS Score Color Red  ?Treat  ?Pain Scale 0-10  ?Notify: Provider  ?Provider Name/Title J. Olena Heckle, NP  ?Date Provider Notified 05/30/21  ?Time Provider Notified [phone]  ?Notification Type  ?(SECURE CHAT)  ?Notification Reason Change in status  ?Provider response No new orders  ?Date of Provider Response 05/30/21  ?Time of Provider Response [phone]  ?Notify: Rapid Response  ?Name of Rapid Response RN Notified Angus Palms, RN  ?Date Rapid Response Notified 05/30/21  ?Time Rapid Response Notified 0252  ?Assess: SIRS CRITERIA  ?SIRS Temperature  0  ?SIRS Pulse 1  ?SIRS Respirations  1  ?SIRS WBC 1  ?SIRS Score Sum  3  ? ? ?

## 2021-05-30 NOTE — Progress Notes (Signed)
?   05/30/21 0110  ?Notify: Provider  ?Provider Name/Title Gershon Cull, NP  ?Date Provider Notified 05/30/21  ?Time Provider Notified 0110  ?Notification Type  ?(SECURE CHAT)  ?Notification Reason Other (Comment) ?(ABNORMAL VITAL SIGN)  ?Provider response See new orders  ?Date of Provider Response 05/30/21  ?Time of Provider Response 0115  ? ? ?

## 2021-05-30 NOTE — Plan of Care (Signed)
°  Problem: Respiratory: °Goal: Ability to maintain adequate ventilation will improve °Outcome: Progressing °  °

## 2021-05-30 NOTE — Progress Notes (Signed)
Pt arrived via stretcher w/ IV s running. Pt favors Lt side. Pt has wounds on sacrum and peri areas. Pt has a phone w/charger in a laptop bag.   ?

## 2021-05-30 NOTE — Progress Notes (Addendum)
Pt has red MEWS, CN notified.  ?Pt had yellow MEWS at other facility.  ? ?Pt is sinus tach in 110s-120s w/ an occasional spike to the 130s. RR is in higher 20s-30s.  ? ?Pt doesn't have any complaints at this time.  ? ?On call MD messaged/aware and Rapid Response are aware.  ?

## 2021-05-30 NOTE — Progress Notes (Signed)
Upon admission assessment, patient has multiple raised areas to his pubic area and inner thighs. They appear to be small boils.Some of them are drainage moderated purulent drainage. Patient states that he is aware Patient was wearing a adult brief and it was more saturated with drainage than urine. Patient had diapers from home for use. ? ?
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
## 2021-05-30 NOTE — Consult Note (Signed)
WOC Nurse Consult Note: ?Reason for Consult:left lateral malleolus chronic, non healing wound, full thickness. Seen by Podiatric Medicine (Dr. Wallie Char) who is deferring to Orthopedics as limb salvage is in question. See her noted dated 3/20 and 3/21. This wound is outside the scope of [place] and I will also defer to Orthopedics for management. ?Patient also with moisture associated skin damage, (irritant contact dermatitis, erythema intertrigo) and Stage 2 partial thickness pressure injury to right buttocks.  ? ?Wound type: Infection, moisture (irritant contact dermatitis) ? ?ICD-10 CM Codes for Irritant Dermatitis ? ?K16W1 - Due to friction or contact with other specified body fluids ? ?L30.4  - Erythema intertrigo. Also used for abrasion of the hand, chafing of the skin, dermatitis due to sweating and friction, friction dermatitis, friction eczema, and genital/thigh intertrigo.  ? ?Pressure Injury POA: Yes ?Measurement: ?Stage 2 to right buttock: 1cm x 1cm x 0.1cm  ?Wound bed: Pink, moist ?Drainage (amount, consistency, odor) none ?Periwound:intact ?Dressing procedure/placement/frequency: ?I will provide a mattress replacement with low air loss feature, turning and repositioning is in place. Heels are to be floated. Topical, care guidance is provided for the care of the left lateral malleolus wound until Ortho can evaluate and will be to cleanse the wound with NS, pat dry and cover with an antimicrobial nonadherent (xeroform), cover with an ABD pad and secure with a few turns of Kerlix roll gauze/paper tape. ? ? ?[place] nursing team will not follow, but will remain available to this patient, the nursing and medical teams.  Please re-consult if needed. ?Thanks, ?Maudie Flakes, MSN, RN, [place], [place], CWON-AP, [place]  ?Pager# [phone]  ? ? ?  ?

## 2021-05-30 NOTE — Progress Notes (Signed)
?   05/30/21 0240 05/30/21 0242  ?Assess: MEWS Score  ?Pulse Rate (!) 113 (!) 115  ?ECG Heart Rate (!) 112 (!) 115  ?Resp (!) 35 (!) 34  ?SpO2  --  99 %  ?Assess: MEWS Score  ?MEWS Temp 0 0  ?MEWS Systolic 1 1  ?MEWS Pulse 2 2  ?MEWS RR 2 2  ?MEWS LOC 0 0  ?MEWS Score 5 5  ?MEWS Score Color Red Red  ?Assess: if the MEWS score is Yellow or Red  ?Were vital signs taken at a resting state?  --  Yes  ?Focused Assessment  --  Change from prior assessment (see assessment flowsheet)  ?Does the patient meet 2 or more of the SIRS criteria?  --  Yes  ?Does the patient have a confirmed or suspected source of infection?  --  Yes  ?Provider and Rapid Response Notified?  --  Yes  ?MEWS guidelines implemented *See Row Information*  --  Yes  ?Treat  ?MEWS Interventions  --  Administered prn meds/treatments;Escalated (See documentation below)  ?Take Vital Signs  ?Increase Vital Sign Frequency   --  Red: Q 1hr X 4 then Q 4hr X 4, if remains red, continue Q 4hrs  ?Escalate  ?MEWS: Escalate  --  Red: discuss with charge nurse/RN and provider, consider discussing with RRT  ?Assess: SIRS CRITERIA  ?SIRS Temperature  0 0  ?SIRS Pulse 1 1  ?SIRS Respirations  1 1  ?SIRS WBC 1 1  ?SIRS Score Sum  3 3  ? ? ?

## 2021-05-30 NOTE — Progress Notes (Signed)
Critical hgb of 6.2 reported to night provider J. Olena Heckle, NP via secure chat. ?

## 2021-05-30 NOTE — Progress Notes (Signed)
Patient ID: Lee Reilly, male   DOB: 1986-03-11, 36 y.o.   MRN: [id number] ?I did come to the bedside late this afternoon to see the patient as well as speak to his brother who is a physician.  He does have a wound over his left lateral malleolus which is chronic but obviously there is some acute osteomyelitis involving the bone which is exposed.  The patient and his mother should be commended for how well they have taking care of this wound.  There was no purulence that I could express from the wound and the surrounding tissue looks good.  However, there is exposed bone.  I did go over the MRI images with the patient's brother.  I took photographs of the wound and have reached out to my partner Dr. Sharol Given.  He plans to see the patient first thing tomorrow morning and will develop a treatment plan at that point.  I did also speak with Dr. Verlon Au and the plan is to have the patient transferred over to [facility] for continued care. ?

## 2021-05-30 NOTE — Progress Notes (Signed)
?   05/30/21 0054  ?Assess: MEWS Score  ?Temp 97.6 ?F (36.4 ?C)  ?BP (!) 90/40  ?Pulse Rate (!) 122  ?Resp 20  ?SpO2 100 %  ?O2 Device Room Air  ?Assess: MEWS Score  ?MEWS Temp 0  ?MEWS Systolic 1  ?MEWS Pulse 2  ?MEWS RR 0  ?MEWS LOC 0  ?MEWS Score 3  ?MEWS Score Color Yellow  ?Assess: if the MEWS score is Yellow or Red  ?Were vital signs taken at a resting state? Yes  ?Focused Assessment Change from prior assessment (see assessment flowsheet)  ?Does the patient meet 2 or more of the SIRS criteria? Yes  ?Does the patient have a confirmed or suspected source of infection? Yes  ?Provider and Rapid Response Notified? Yes  ?MEWS guidelines implemented *See Row Information* Yes  ?Treat  ?Pain Scale 0-10  ?Pain Score 0  ?Take Vital Signs  ?Increase Vital Sign Frequency  Yellow: Q 2hr X 2 then Q 4hr X 2, if remains yellow, continue Q 4hrs  ?Notify: Charge Nurse/RN  ?Name of Charge Nurse/RN Notified Lauren, RN  ?Date Charge Nurse/RN Notified 05/30/21  ?Time Charge Nurse/RN Notified 0120  ?Assess: SIRS CRITERIA  ?SIRS Temperature  0  ?SIRS Pulse 1  ?SIRS Respirations  0  ?SIRS WBC 0  ?SIRS Score Sum  1  ? ? ?

## 2021-05-30 NOTE — Progress Notes (Signed)
Report called to [place], RN at [facility] [employer]. Pt made aware and family. SRP,RN ?

## 2021-05-31 DIAGNOSIS — S91002A Unspecified open wound, left ankle, initial encounter: Secondary | ICD-10-CM | POA: Diagnosis not present

## 2021-05-31 DIAGNOSIS — E43 Unspecified severe protein-calorie malnutrition: Secondary | ICD-10-CM | POA: Diagnosis not present

## 2021-05-31 DIAGNOSIS — D72829 Elevated white blood cell count, unspecified: Secondary | ICD-10-CM | POA: Diagnosis not present

## 2021-05-31 DIAGNOSIS — M86272 Subacute osteomyelitis, left ankle and foot: Secondary | ICD-10-CM | POA: Diagnosis not present

## 2021-05-31 DIAGNOSIS — A419 Sepsis, unspecified organism: Secondary | ICD-10-CM | POA: Diagnosis not present

## 2021-05-31 LAB — TYPE AND SCREEN
ABO/RH(D): B NEG
Antibody Screen: NEGATIVE
Unit division: 0

## 2021-05-31 LAB — CBC WITH DIFFERENTIAL/PLATELET
Abs Immature Granulocytes: 0.14 10*3/uL — ABNORMAL HIGH (ref 0.00–0.07)
Basophils Absolute: 0.1 10*3/uL (ref 0.0–0.1)
Basophils Relative: 0 %
Eosinophils Absolute: 0.2 10*3/uL (ref 0.0–0.5)
Eosinophils Relative: 1 %
HCT: 25.1 % — ABNORMAL LOW (ref 39.0–52.0)
Hemoglobin: 7.1 g/dL — ABNORMAL LOW (ref 13.0–17.0)
Immature Granulocytes: 1 %
Lymphocytes Relative: 11 %
Lymphs Abs: 2.3 10*3/uL (ref 0.7–4.0)
MCH: 20.5 pg — ABNORMAL LOW (ref 26.0–34.0)
MCHC: 28.3 g/dL — ABNORMAL LOW (ref 30.0–36.0)
MCV: 72.3 fL — ABNORMAL LOW (ref 80.0–100.0)
Monocytes Absolute: 1 10*3/uL (ref 0.1–1.0)
Monocytes Relative: 5 %
Neutro Abs: 17.9 10*3/uL — ABNORMAL HIGH (ref 1.7–7.7)
Neutrophils Relative %: 82 %
Platelets: 450 10*3/uL — ABNORMAL HIGH (ref 150–400)
RBC: 3.47 MIL/uL — ABNORMAL LOW (ref 4.22–5.81)
RDW: 22.7 % — ABNORMAL HIGH (ref 11.5–15.5)
WBC: 21.5 10*3/uL — ABNORMAL HIGH (ref 4.0–10.5)
nRBC: 0 % (ref 0.0–0.2)

## 2021-05-31 LAB — COMPREHENSIVE METABOLIC PANEL
ALT: 8 U/L (ref 0–44)
AST: 9 U/L — ABNORMAL LOW (ref 15–41)
Albumin: <1.5 g/dL — ABNORMAL LOW (ref 3.5–5.0)
Alkaline Phosphatase: 53 U/L (ref 38–126)
Anion gap: 7 (ref 5–15)
BUN: <5 mg/dL — ABNORMAL LOW (ref 6–20)
CO2: 19 mmol/L — ABNORMAL LOW (ref 22–32)
Calcium: 7.5 mg/dL — ABNORMAL LOW (ref 8.9–10.3)
Chloride: 110 mmol/L (ref 98–111)
Creatinine, Ser: 0.39 mg/dL — ABNORMAL LOW (ref 0.61–1.24)
GFR, Estimated: >60 mL/min (ref >60)
Glucose, Bld: 74 mg/dL (ref 70–99)
Potassium: 2.8 mmol/L — ABNORMAL LOW (ref 3.5–5.1)
Sodium: 136 mmol/L (ref 135–145)
Total Bilirubin: 0.7 mg/dL (ref 0.3–1.2)
Total Protein: 5.5 g/dL — ABNORMAL LOW (ref 6.5–8.1)

## 2021-05-31 LAB — MRSA NEXT GEN BY PCR, NASAL: MRSA by PCR Next Gen: NOT DETECTED

## 2021-05-31 LAB — BPAM RBC
Blood Product Expiration Date: 202303302359
ISSUE DATE / TIME: 202303210325
Unit Type and Rh: 1700

## 2021-05-31 LAB — MAGNESIUM: Magnesium: 1.5 mg/dL — ABNORMAL LOW (ref 1.7–2.4)

## 2021-05-31 MED ORDER — SODIUM CHLORIDE 0.9 % IV SOLN
2.0000 g | INTRAVENOUS | Status: DC
  Administered 2021-05-31 – 2021-06-04 (×5): 2 g via INTRAVENOUS
  Filled 2021-05-31 (×5): qty 20

## 2021-05-31 MED ORDER — MIDODRINE HCL 5 MG PO TABS
5.0000 mg | ORAL_TABLET | Freq: Three times a day (TID) | ORAL | Status: DC
  Administered 2021-05-31 – 2021-06-05 (×15): 5 mg via ORAL
  Filled 2021-05-31 (×15): qty 1

## 2021-05-31 NOTE — Plan of Care (Signed)
?  Problem: Education: ?Goal: Knowledge of General Education information will improve ?Description: Including pain rating scale, medication(s)/side effects and non-pharmacologic comfort measures ?Outcome: Progressing ?  ?Problem: Clinical Measurements: ?Goal: Will remain free from infection ?Outcome: Progressing ?  ?Problem: Activity: ?Goal: Risk for activity intolerance will decrease ?Outcome: Progressing ?  ?Problem: Nutrition: ?Goal: Adequate nutrition will be maintained ?Outcome: Progressing ?  ?Problem: Elimination: ?Goal: Will not experience complications related to bowel motility ?Outcome: Progressing ?  ?Problem: Skin Integrity: ?Goal: Risk for impaired skin integrity will decrease ?Outcome: Progressing ?  ?
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
## 2021-05-31 NOTE — Plan of Care (Signed)
°  Problem: Fluid Volume: °Goal: Hemodynamic stability will improve °Outcome: Progressing °  °Problem: Clinical Measurements: °Goal: Diagnostic test results will improve °Outcome: Progressing °Goal: Signs and symptoms of infection will decrease °Outcome: Progressing °  °

## 2021-05-31 NOTE — Progress Notes (Signed)
?PROGRESS NOTE ? ?Lee Reilly  OZH:[id number] DOB: [birth year] DOA: 05/29/2021 ?PCP: [facility]  ? 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  Patient was admitted for the suspicion of sepsis, he was hypotensive and tachycardic on presentation.  His hemoglobin was found to be in the range of 6.  He was transfused with a unit of PRBC.  General surgery consulted for evaluation of stage II right buttock wound.  Imaging of the left lower extremity showed acute osteomyelitis of lateral malleolus.  Orthopedics consulted.  Currently on board spectrum antibiotics. ? ?Assessment & Plan: ? ?Principal Problem: ?  Sepsis ([place]) ?Active Problems: ?  Paraplegia ([place]) ?  Neurogenic bladder ?  Ankle wound, left, initial encounter ?  Hypokalemia ?  Microcytic anemia ?  Protein-calorie malnutrition, severe ([place]) ?  Pressure injury of skin ? ? ?Sepsis: Presented with hypotension, tachycardia.  Blood cultures have been sent.  We will get wound cultures when appropriate.  Continue current antibiotics. ?Blood pressure remains low, started on midodrine.  Continue IV fluids. ?Has leukocytosis. ?Low threshold to call critical care services. ?Source of sepsis is most likely left foot wound. ?Normal lactic acid level.  Low procalcitonin ? ?Osteomyelitis of the left lateral malleolus: MRI showed deep soft tissue ulceration at the lateral ankle with ulcer base extending to the underlying lateral malleolus. Acute osteomyelitis of the lateral malleolus. Small tibiotalar joint effusion with thin synovial enhancement,concerning for septic arthritis.Enhancing tenosynovitis involving the peroneal tendons .  Orthopedics  following ? ?Hidradenitis suppurativa of perianal, right buttock, bilateral groin: No clear abscess or drainable collection.  No need of CT scan as per general surgery.  General surgery recommended she is back, antibiotics IV for now oral antibiotics for [redacted] weeks along with topical clindamycin, outpatient dermatology follow-up. ? ?Suspected pneumonia: Chest x-ray showed homogeneous opacity in the medial right upper lung fields suggesting atelectasis/pneumonia in the right upper lobe. There is no pleural effusion.  Currently on room air.  Continue current antibiotics ? ?Microcytic anemia/iron deficiency anemia: Hemoglobin was in the range of 6 on presentation.  Transfused with a unit of PRBC.  Hemoglobin today in the range of 7. Iron studies showed low iron, given a dose of IV iron ? ?Hypokalemia: Supplemented with potassium ? ?Debility/T4 paraplegia: Complicated by fecal, urinary incontinence.  Nonambulatory.  Uses wheelchair.  Followed with Dr. Dema Severin for consideration of diverting colostomy he says he declined colostomy ? ?Severe protein calorie malnutrition: Dietitian following ? ? ?Nutrition Problem: Increased nutrient needs ?Etiology: wound healing ?Pressure Injury Buttocks Right Stage 2 -  Partial thickness loss of dermis presenting as a shallow open injury with a red, pink wound bed without slough. (Active)  ?   ?Location: Buttocks  ?Location Orientation: Right  ?Staging: Stage 2 -  Partial thickness loss of dermis presenting as a shallow open injury with a red, pink wound bed without slough.  ?Wound Description (Comments):   ?Present on Admission: Yes  ?Dressing Type Foam - Lift dressing to assess site every shift;Bismuth petroleum 05/30/21 1700  ? ? ?DVT prophylaxis:SCDs Start: 05/30/21 0028 ? ? ?  Code Status: Full Code ? ?Family Communication: None at the bedside ? ?Patient status:Inpatient ? ?Patient is from :Home ? ?Anticipated discharge GE:XBMW ? ?Estimated  DC date:Not sure ? ? ?Consultants: Orthopedics,  general surgery ? ?Procedures: None yet ? ?Antimicrobials:  ?Anti-infectives (From admission, onward)  ? ? Start     Dose/Rate Route Frequency Ordered Stop  ? 05/30/21 0800  vancomycin (VANCOREADY) IVPB 750 mg/150 mL       ? 750 mg ?150 mL/hr over 60 Minutes Intravenous Every 12 hours 05/29/21 2001 06/06/21 0759  ? 05/30/21 0600  metroNIDAZOLE (FLAGYL) IVPB 500 mg       ? 500 mg ?100 mL/hr over 60 Minutes Intravenous Every 12 hours 05/30/21 0027 06/06/21 0559  ? 05/29/21 2300  ceFEPIme (MAXIPIME) 2 g in sodium chloride 0.9 % 100 mL IVPB       ? 2 g ?200 mL/hr over 30 Minutes Intravenous Every 8 hours 05/29/21 1754 06/05/21 2259  ? 05/29/21 2200  ceFEPIme (MAXIPIME) 2 g in sodium chloride 0.9 % 100 mL IVPB  Status:  Discontinued       ? 2 g ?200 mL/hr over 30 Minutes Intravenous Every 8 hours 05/29/21 1746 05/29/21 1754  ? 05/29/21 1500  ceFEPIme (MAXIPIME) 2 g in sodium chloride 0.9 % 100 mL IVPB       ? 2 g ?200 mL/hr over 30 Minutes Intravenous  Once 05/29/21 1456 05/29/21 1544  ? 05/29/21 1500  metroNIDAZOLE (FLAGYL) IVPB 500 mg       ? 500 mg ?100 mL/hr over 60 Minutes Intravenous  Once 05/29/21 1456 05/29/21 1922  ? 05/29/21 1500  vancomycin (VANCOCIN) IVPB 1000 mg/200 mL premix       ? 1,000 mg ?200 mL/hr over 60 Minutes Intravenous  Once 05/29/21 1456 05/29/21 2059  ? ?  ? ? ?Subjective: ? ?Patient seen and examined at the bedside this morning.  During my evaluation, he was overall hemodynamically stable.  Was tachycardic, EKG monitor showing sinus tachycardia but blood pressure stable.  Denies any new complaints.  Declined any significant pain on the left foot or back.  We had a long discussion about his current management plan ? ?Objective: ?Vitals:  ? 05/31/21 0401 05/31/21 0932 05/31/21 6712 05/31/21 0731  ?BP: (!) 92/47  (!) 88/44 (!) 91/45  ?Pulse:  (!) 111  (!) 110  ?Resp: (!) 40 (!) 30    ?Temp: 99.6 ?F (37.6 ?C)   98.2 ?F (36.8 ?C)  ?TempSrc: Oral   Oral  ?SpO2: 98%   99%  ?Weight:      ?Height:       ? ? ?Intake/Output Summary (Last 24 hours) at 05/31/2021 0841 ?Last data filed at 05/30/2021 2200 ?Gross per 24 hour  ?Intake 2448.26 ml  ?Output --  ?Net 2448.26 ml  ? ?Filed Weights  ? 05/29/21 1507  ?Weight: 54.4 kg  ? ? ?Examination: ? ?General exam: Overall comfortable, not in distress, very deconditioned, chronically looking ?HEENT: PERRL ?Respiratory system:  no wheezes or crackles  ?Cardiovascular system: Sinus tachycardia ?Gastrointestinal system: Abdomen is nondistended, soft and nontender. ?Central nervous system: Alert and oriented ?Extremities: Atrophy of bilateral lower extremities, wound on the left ankle wrapped with dressing ?Skin: Ulcers on the back ? ? ?Data Reviewed: I have personally reviewed following labs and imaging studies ? ?CBC: ?Recent Labs  ?Lab 05/29/21 ?1406 05/30/21 ?0116 05/30/21 ?1003 05/31/21 ?4580  ?WBC 20.5* 17.2*  --  21.5*  ?NEUTROABS 16.7*  --   --  17.9*  ?HGB 7.0* 6.2* 7.4* 7.1*  ?HCT 26.3* 22.7* 25.7* 25.1*  ?MCV 69.6* 69.0*  --  72.3*  ?PLT 663* 573*  --  450*  ? ?Basic Metabolic Panel: ?Recent Labs  ?Lab 05/29/21 ?1406 05/29/21 ?2150 05/30/21 ?0116  ?NA 136  --  133*  ?K 3.0*  --  3.2*  ?CL 106  --  107  ?CO2 22  --  22  ?GLUCOSE 89  --  102*  ?BUN 7  --  8  ?CREATININE 0.45*  --  0.43*  ?CALCIUM 7.8*  --  7.4*  ?MG  --  1.9  --   ? ? ? ?Recent Results (from the past 240 hour(s))  ?Blood Culture (routine x 2)     Status: None (Preliminary result)  ? Collection Time: 05/29/21  2:04 PM  ? Specimen: BLOOD  ?Result Value Ref Range Status  ? Specimen Description   Final  ?  BLOOD RIGHT ANTECUBITAL ?Performed at [facility], [place] [address] [place], [place] [address] ?  ? Special Requests   Final  ?  BOTTLES DRAWN AEROBIC AND ANAEROBIC Blood Culture adequate volume ?Performed at [facility], [place] [address], [place], [place] [address] ?  ? Culture   Final  ?  NO GROWTH 2 DAYS ?Performed at [facility], [place] [address],  [place], [place] [address] ?  ? Report Status PENDING  Incomplete  ?Blood Culture (routine x 2)     Status: None (Preliminary result)  ? Collection Time: 05/29/21  3:00 PM  ? Specimen: BLOOD  ?Result Value Ref Range Status  ? Specim
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
## 2021-06-01 DIAGNOSIS — M86272 Subacute osteomyelitis, left ankle and foot: Secondary | ICD-10-CM | POA: Diagnosis not present

## 2021-06-01 DIAGNOSIS — R159 Full incontinence of feces: Secondary | ICD-10-CM

## 2021-06-01 DIAGNOSIS — N319 Neuromuscular dysfunction of bladder, unspecified: Secondary | ICD-10-CM

## 2021-06-01 DIAGNOSIS — M00872 Arthritis due to other bacteria, left ankle and foot: Secondary | ICD-10-CM | POA: Diagnosis not present

## 2021-06-01 DIAGNOSIS — A419 Sepsis, unspecified organism: Secondary | ICD-10-CM | POA: Diagnosis not present

## 2021-06-01 DIAGNOSIS — L03116 Cellulitis of left lower limb: Secondary | ICD-10-CM

## 2021-06-01 DIAGNOSIS — M65172 Other infective (teno)synovitis, left ankle and foot: Secondary | ICD-10-CM | POA: Diagnosis not present

## 2021-06-01 DIAGNOSIS — D509 Iron deficiency anemia, unspecified: Secondary | ICD-10-CM

## 2021-06-01 LAB — CBC WITH DIFFERENTIAL/PLATELET
Abs Immature Granulocytes: 0.17 10*3/uL — ABNORMAL HIGH (ref 0.00–0.07)
Basophils Absolute: 0.1 10*3/uL (ref 0.0–0.1)
Basophils Relative: 0 %
Eosinophils Absolute: 0.2 10*3/uL (ref 0.0–0.5)
Eosinophils Relative: 1 %
HCT: 24.9 % — ABNORMAL LOW (ref 39.0–52.0)
Hemoglobin: 7.1 g/dL — ABNORMAL LOW (ref 13.0–17.0)
Immature Granulocytes: 1 %
Lymphocytes Relative: 12 %
Lymphs Abs: 2.5 10*3/uL (ref 0.7–4.0)
MCH: 20.8 pg — ABNORMAL LOW (ref 26.0–34.0)
MCHC: 28.5 g/dL — ABNORMAL LOW (ref 30.0–36.0)
MCV: 72.8 fL — ABNORMAL LOW (ref 80.0–100.0)
Monocytes Absolute: 1 10*3/uL (ref 0.1–1.0)
Monocytes Relative: 5 %
Neutro Abs: 17.2 10*3/uL — ABNORMAL HIGH (ref 1.7–7.7)
Neutrophils Relative %: 81 %
Platelets: 432 10*3/uL — ABNORMAL HIGH (ref 150–400)
RBC: 3.42 MIL/uL — ABNORMAL LOW (ref 4.22–5.81)
RDW: 23.1 % — ABNORMAL HIGH (ref 11.5–15.5)
Smear Review: ADEQUATE
WBC: 21.2 10*3/uL — ABNORMAL HIGH (ref 4.0–10.5)
nRBC: 0 % (ref 0.0–0.2)

## 2021-06-01 LAB — BASIC METABOLIC PANEL
Anion gap: 4 — ABNORMAL LOW (ref 5–15)
BUN: 9 mg/dL (ref 6–20)
CO2: 21 mmol/L — ABNORMAL LOW (ref 22–32)
Calcium: 7.3 mg/dL — ABNORMAL LOW (ref 8.9–10.3)
Chloride: 112 mmol/L — ABNORMAL HIGH (ref 98–111)
Creatinine, Ser: 0.33 mg/dL — ABNORMAL LOW (ref 0.61–1.24)
GFR, Estimated: >60 mL/min (ref >60)
Glucose, Bld: 77 mg/dL (ref 70–99)
Potassium: 3 mmol/L — ABNORMAL LOW (ref 3.5–5.1)
Sodium: 137 mmol/L (ref 135–145)

## 2021-06-01 LAB — C-REACTIVE PROTEIN: CRP: 12 mg/dL — ABNORMAL HIGH (ref <1.0)

## 2021-06-01 LAB — SEDIMENTATION RATE: Sed Rate: 59 mm/hr — ABNORMAL HIGH (ref 0–16)

## 2021-06-01 MED ORDER — POVIDONE-IODINE 10 % EX SWAB: 2.0000 "application " | Freq: Once | CUTANEOUS | Status: DC

## 2021-06-01 MED ORDER — POTASSIUM CHLORIDE CRYS ER 20 MEQ PO TBCR
40.0000 meq | EXTENDED_RELEASE_TABLET | ORAL | Status: AC
  Administered 2021-06-01 (×2): 40 meq via ORAL
  Filled 2021-06-01 (×2): qty 2

## 2021-06-01 MED ORDER — ENSURE PRE-SURGERY PO LIQD
296.0000 mL | Freq: Once | ORAL | Status: AC
  Administered 2021-06-01: 296 mL via ORAL
  Filled 2021-06-01: qty 296

## 2021-06-01 MED ORDER — MAGNESIUM SULFATE 2 GM/50ML IV SOLN
2.0000 g | Freq: Once | INTRAVENOUS | Status: AC
  Administered 2021-06-01: 2 g via INTRAVENOUS
  Filled 2021-06-01: qty 50

## 2021-06-01 MED ORDER — CHLORHEXIDINE GLUCONATE 4 % EX LIQD
60.0000 mL | Freq: Once | CUTANEOUS | Status: DC
  Filled 2021-06-01: qty 60

## 2021-06-01 MED ORDER — SODIUM CHLORIDE 0.9 % IV SOLN
8.0000 mg/kg | Freq: Every day | INTRAVENOUS | Status: DC
  Administered 2021-06-01 – 2021-06-04 (×3): 450 mg via INTRAVENOUS
  Filled 2021-06-01 (×5): qty 9

## 2021-06-01 MED ORDER — CEFAZOLIN SODIUM-DEXTROSE 2-4 GM/100ML-% IV SOLN
2.0000 g | INTRAVENOUS | Status: AC
  Administered 2021-06-02: 2 g via INTRAVENOUS
  Filled 2021-06-01: qty 100

## 2021-06-01 NOTE — Progress Notes (Signed)
? ?[facility] Infectious Diseases Follow Up Note ? ?Patient Identification: ?Patient Name: Lee Reilly MRN: [id number] [place] Date: 05/29/2021 12:52 PM ?Age: 36 y.o.Today's Date: 06/01/2021 ? ? ?Reason for Visit: leukocytosis  ? ?Principal Problem: ?  Sepsis ([place]) ?Active Problems: ?  Paraplegia ([place]) ?  Neurogenic bladder ?  Ankle wound, left, initial encounter ?  Hypokalemia ?  Microcytic anemia ?  Protein-calorie malnutrition, severe ([place]) ?  Pressure injury of skin ?  Subacute osteomyelitis, left ankle and foot ([place]) ?  Leukocytosis ? ?Antibiotics:  ?Vancomycin 3/20-c ?Metronidazole 3/20 3/22 ?Cefepime 3/20-3/22, ceftriaxone 3/22 ?  ?Lines/Hardware: ? ?Interval Events: continues to be afebrile with no new concerns, WBC stable at 21.  ? ?Assessment ?# Left Lateral ankle chronic wound/osteomyelitis/Tibiotalar septic arthritis/cellulitis/tenosynovitis with recent increased foul smelling drainage ?  ?Left calcaneal ulcer  ?  ?Leukocytosis - seems to be related to the acute worsening of his chronic left ankle wound + reactive component. 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 No respiratory and GI symptoms. No GU symptoms, however limited with t4 paraplegia. No signs of phlebitis. No other definite source of infection ?  ?Microcytic Anemia - hb was 12 in 2017, he mentioned few drop of blood coming out of the left ankle wound. He also reported being vegan. His Hb dropped to 6 this admission requiring transfusion. Defer work up to [employer] ?  ?H/o Neuroblastoma with T4 paraplegia  ?Neurogenic bladder/Fecal Incontinence  ? ?Recommendations ?Plan for surgical intervention by Ortho noted on Friday ( excision of the distal fibula debridement of the wound application of the wound VAC local tissue rearrangement wound closure and application of biologic tissue graft). Please send cultures as appropriate. He wishes to go home by  Saturday if feasible  ? ?Will switch Vancomycin to Daptomycin for renal safety  ?Continue ceftriaxone  ?Fu blood cultures ?Monitor CBC w diff, fevers  or any new symptoms/signs concerning for infection  ?Possibly need prolonged abtx  ?Following  ? ?Rest of the management as per the primary team. ?Thank you for the consult. Please page with pertinent questions or concerns. ? ?______________________________________________________________________ ?Subjective ?patient seen and examined at the bedside.  ?He would like to go home on Saturday  ?No new complaints  ? ?Vitals ?BP 95/74 (BP Location: Left Arm)   Pulse 99   Temp 98.4 ?F (36.9 ?C) (Oral)   Resp (!) 27   Ht '5\' 3"'$  (1.6 m)   Wt 54.4 kg   SpO2 98%   BMI 21.26 kg/m?  ? ?  ?Physical Exam ?Constitutional:  sleeping comfortably ?   Comments:  ? ?Cardiovascular:  ?   Rate and Rhythm: Normal rate and regular rhythm.  ?   Heart sounds:  ? ?Pulmonary:  ?   Effort: Pulmonary effort is normal.  ?   Comments:  ? ?Abdominal:  ?   Palpations: Abdomen is soft.  ?   Tenderness: non distended  ? ?Skin: ?   Comments: ( see media) ? ?Neurological:  ?   General: T4 paraplegia  ? ?Psychiatric:     ?   Mood and Affect: Mood normal.  ? ?Pertinent Microbiology ?Results for orders placed or performed during the hospital encounter of 05/29/21  ?Blood Culture (routine x 2)     Status: None (Preliminary result)  ? Collection Time: 05/29/21  2:04 PM  ? Specimen: BLOOD  ?Result Value Ref Range Status  ? Specimen Description   Final  ?  BLOOD RIGHT ANTECUBITAL ?  Performed at [facility], [place] [address], [place], [place] [address] ?  ? Special Requests   Final  ?  BOTTLES DRAWN AEROBIC AND ANAEROBIC Blood Culture adequate volume ?Performed at [facility], [place] [address] [place], [place] [address] ?  ? Culture   Final  ?  NO GROWTH 3 DAYS ?Performed at [facility], [place] [address] [place], [place] [address] ?  ? Report Status PENDING   Incomplete  ?Blood Culture (routine x 2)     Status: None (Preliminary result)  ? Collection Time: 05/29/21  3:00 PM  ? Specimen: BLOOD  ?Result Value Ref Range Status  ? Specimen Description   Final  ?  BLOOD SITE NOT SPECIFIED ?Performed at [facility], [place] [address] [place], [place] [address] ?  ? Special Requests   Final  ?  BOTTLES DRAWN AEROBIC AND ANAEROBIC Blood Culture adequate volume ?Performed at [facility], [place] [address] [place], [place] [address] ?  ? Culture   Final  ?  NO GROWTH 3 DAYS ?Performed at [facility], [place] [address], [place], [place] [address] ?  ? Report Status PENDING  Incomplete  ?Resp Panel by RT-PCR (Flu A&B, Covid) Peripheral     Status: None  ? Collection Time: 05/29/21  3:08 PM  ? Specimen: Peripheral; Nasopharyngeal(NP) swabs in vial transport medium  ?Result Value Ref Range Status  ? SARS Coronavirus 2 by RT PCR NEGATIVE NEGATIVE Final  ?  Comment: (NOTE) ?SARS-CoV-2 target nucleic acids are NOT DETECTED. ? ?The SARS-CoV-2 RNA is generally detectable in upper respiratory ?specimens during the acute phase of infection. The lowest ?concentration of SARS-CoV-2 viral copies this assay can detect is ?138 copies/mL. A negative result does not preclude SARS-Cov-2 ?infection and should not be used as the sole basis for treatment or ?other patient management decisions. A negative result may occur with  ?improper specimen collection/handling, submission of specimen other ?than nasopharyngeal swab, presence of viral mutation(s) within the ?areas targeted by this assay, and inadequate number of viral ?copies(<138 copies/mL). A negative result must be combined with ?clinical observations, patient history, and epidemiological ?information. The expected result is Negative. ? ?Fact Sheet for Patients:  ?EntrepreneurPulse.com.au ? ?Fact Sheet for Healthcare Providers:  ?IncredibleEmployment.be ? ?This test is no t yet  approved or cleared by the [place] FDA and  ?has been authorized for detection and/or diagnosis of SARS-CoV-2 by ?FDA under an Emergency Use Authorization (EUA). This EUA will remain  ?in effect (meaning this test can be used) for the duration of the ?COVID-19 declaration under Section 564(b)(1) of the Act, 21 ?U.S.C.section 360bbb-3(b)(1), unless the authorization is terminated  ?or revoked sooner.  ? ? ?  ? Influenza A by PCR NEGATIVE NEGATIVE Final  ? Influenza B by PCR NEGATIVE NEGATIVE Final  ?  Comment: (NOTE) ?The Xpert Xpress SARS-CoV-2/FLU/RSV plus assay is intended as an aid ?in the diagnosis of influenza from Nasopharyngeal swab specimens and ?should not be used as a sole basis for treatment. Nasal washings and ?aspirates are unacceptable for Xpert Xpress SARS-CoV-2/FLU/RSV ?testing. ? ?Fact Sheet for Patients: ?EntrepreneurPulse.com.au ? ?Fact Sheet for Healthcare Providers: ?IncredibleEmployment.be ? ?This test is not yet approved or cleared by the [place] FDA and ?has been authorized for detection and/or diagnosis of SARS-CoV-2 by ?FDA under an Emergency Use Authorization (EUA). This EUA will remain ?in effect (meaning this test can be used) for the duration of the ?COVID-19  declaration under Section 564(b)(1) of the Act, 21 U.S.C. ?section 360bbb-3(b)(1), unless the authorization is terminated or ?revoked. ? ?Performed at [facility], [place] [address] ?[place], [place] [address] ?  ?MRSA Next Gen by PCR, Nasal     Status: None  ? Collection Time: 05/31/21  4:40 PM  ? Specimen: Nasal Mucosa; Nasal Swab  ?Result Value Ref Range Status  ? MRSA by PCR Next Gen NOT DETECTED NOT DETECTED Final  ?  Comment: (NOTE) ?The GeneXpert MRSA Assay (FDA approved for NASAL specimens only), ?is one component of a comprehensive MRSA colonization surveillance ?program. It is not intended to diagnose MRSA infection nor to guide ?or monitor treatment for  MRSA infections. ?Test performance is not FDA approved in patients less than 2 years ?old. ?Performed at [facility], [place] [address] [place], [place] ?[address] ?  ? ? ?Pertinent Lab. ? ?  Latest Ref Rng & Units 3/2

## 2021-06-01 NOTE — Progress Notes (Signed)
?PROGRESS NOTE ? ?Lee Reilly  FGH:[id number] DOB: [birth year] DOA: 05/29/2021 ?PCP: [facility]  ? 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  Patient was admitted for the suspicion of sepsis, he was hypotensive and tachycardic on presentation.  His hemoglobin was found to be in the range of 6.  He was transfused with a unit of PRBC.  General surgery consulted for evaluation of stage II right buttock wound.  Imaging of the left lower extremity showed acute osteomyelitis of lateral malleolus.  Orthopedics/ID consulted.  Currently on board spectrum antibiotics.  Plan for surgical intervention tomorrow by orthopedics ? ?Assessment & Plan: ? ?Principal Problem: ?  Sepsis ([place]) ?Active Problems: ?  Paraplegia ([place]) ?  Neurogenic bladder ?  Ankle wound, left, initial encounter ?  Hypokalemia ?  Microcytic anemia ?  Protein-calorie malnutrition, severe ([place]) ?  Pressure injury of skin ?  Subacute osteomyelitis, left ankle and foot ([place]) ?  Leukocytosis ? ? ?Sepsis: Presented with hypotension, tachycardia.  Blood cultures have been sent.  We will get wound cultures when appropriate.  Continue current antibiotics.  Follow-up cultures:NGTD ?Blood pressure remains low, started on midodrine.  Continue gentle IV fluids. ?Has leukocytosis. ?Source of sepsis is most likely left foot wound. ?Normal lactic acid level.Low procalcitonin. ?ID consulted and following.  ? ?Osteomyelitis of the left lateral malleolus: MRI showed deep soft tissue ulceration at the lateral ankle with ulcer base extending to the underlying lateral malleolus. Acute osteomyelitis of the lateral malleolus. Small tibiotalar joint effusion with thin  synovial enhancement,concerning for septic arthritis.Enhancing tenosynovitis involving the peroneal tendons .  Orthopedics following, plan for surgical intervention tomorrow ? ?Hidradenitis suppurativa of perianal, right buttock, bilateral groin: No clear abscess or drainable collection.  No need of CT scan as per general surgery.  General surgery recommended she is back, antibiotics IV for now oral antibiotics for [redacted] weeks along with topical clindamycin, outpatient dermatology follow-up. ? ?Suspected pneumonia: Chest x-ray showed homogeneous opacity in the medial right upper lung fields suggesting atelectasis/pneumonia in the right upper lobe. There is no pleural effusion.  Currently on room air.  Continue current antibiotics ? ?Microcytic anemia/iron deficiency anemia: Hemoglobin was in the range of 6 on presentation.  Transfused with a unit of PRBC.  Hemoglobin  in the range of 7. Iron studies showed low iron, given a dose of IV iron.  Will transfuse if hemoglobin drops less than 7 ? ?Hypokalemia/hypomagnesemia: Supplemented with potassium and magnesium ? ?Debility/T4 paraplegia: Complicated by fecal, urinary incontinence.  Nonambulatory.  Uses wheelchair.  Followed with Dr. Dema Severin for consideration of diverting colostomy he says he declined colostomy ? ?Severe protein calorie malnutrition: Dietitian following ? ? ?Nutrition Problem: Increased nutrient needs ?Etiology: wound healing ?Pressure Injury Buttocks Right Stage 2 -  Partial thickness loss of dermis presenting as a shallow open injury with a red, pink wound bed without slough. (Active)  ?   ?Location: Buttocks  ?Location Orientation: Right  ?Staging: Stage 2 -  Partial thickness loss of dermis presenting as a shallow open injury with a red, pink wound bed without slough.  ?Wound Description (Comments):   ?Present on Admission: Yes  ?Dressing Type None 05/31/21 2026  ? ? ?DVT prophylaxis:SCDs Start: 05/30/21 0028 ? ? ?  Code Status: Full Code ? ?Family  Communication: Mother on phone on 3/22 ?Patient status:Inpatient ? ?Patient is from :Home ? ?Anticipated discharge CN:OBSJ ? ?Estimated DC date:Not sure ? ? ?Consultants: Orthopedics, general surgery ? ?Procedures: None yet ? ?Antimicrobials:  ?Anti-infectives (From admission, onward)  ? ? Start     Dose/Rate Route Frequency Ordered Stop  ? 05/31/21 2200  cefTRIAXone (ROCEPHIN) 2 g in sodium chloride 0.9 % 100 mL IVPB       ? 2 g ?200 mL/hr over 30 Minutes Intravenous Every 24 hours 05/31/21 1503    ? 05/30/21 0800  vancomycin (VANCOREADY) IVPB 750 mg/150 mL       ? 750 mg ?150 mL/hr over 60 Minutes Intravenous Every 12 hours 05/29/21 2001 06/06/21 0759  ? 05/30/21 0600  metroNIDAZOLE (FLAGYL) IVPB 500 mg  Status:  Discontinued       ? 500 mg ?100 mL/hr over 60 Minutes Intravenous Every 12 hours 05/30/21 0027 05/31/21 1503  ? 05/29/21 2300  ceFEPIme (MAXIPIME) 2 g in sodium chloride 0.9 % 100 mL IVPB  Status:  Discontinued       ? 2 g ?200 mL/hr over 30 Minutes Intravenous Every 8 hours 05/29/21 1754 05/31/21 1503  ? 05/29/21 2200  ceFEPIme (MAXIPIME) 2 g in sodium chloride 0.9 % 100 mL IVPB  Status:  Discontinued       ? 2 g ?200 mL/hr over 30 Minutes Intravenous Every 8 hours 05/29/21 1746 05/29/21 1754  ? 05/29/21 1500  ceFEPIme (MAXIPIME) 2 g in sodium chloride 0.9 % 100 mL IVPB       ? 2 g ?200 mL/hr over 30 Minutes Intravenous  Once 05/29/21 1456 05/29/21 1544  ? 05/29/21 1500  metroNIDAZOLE (FLAGYL) IVPB 500 mg       ? 500 mg ?100 mL/hr over 60 Minutes Intravenous  Once 05/29/21 1456 05/29/21 1922  ? 05/29/21 1500  vancomycin (VANCOCIN) IVPB 1000 mg/200 mL premix       ? 1,000 mg ?200 mL/hr over 60 Minutes Intravenous  Once 05/29/21 1456 05/29/21 2059  ? ?  ? ? ?Subjective: ? ?Patient seen and examined at the bedside this morning.  Hemodynamically stable.  Blood pressure better today.  Tachycardia has been improving.  Denies any significant pain.  Eager to go home after surgery ? ? ?Objective: ?Vitals:  ?  05/31/21 1451 05/31/21 2003 06/01/21 0516 06/01/21 0803  ?BP: (!) 92/55 (!) 1'03/58 95/69 95/74 '$  ?Pulse: (!) 103 (!) 103 (!) 110 99  ?Resp:  20 (!) 25 (!) 27  ?Temp: 99.1 ?F (37.3 ?C) 99.5 ?F (37.5 ?C) 99.1 ?F (37.3 ?C) 98.4 ?F (36.9 ?C)  ?TempSrc: Oral Oral Oral Oral  ?SpO2: 98% 100% 98% 98%  ?Weight:      ?Height:      ? ?No intake or output data in the 24 hours ending 06/01/21 0810 ? ?Filed Weights  ? 05/29/21 1507  ?Weight: 54.4 kg  ? ? ?Examination: ? ? ?General exam: Overall comfortable, not in distress, very deconditioned, chronically ill looking ?HEENT: PERRL ?Respiratory system:  no wheezes or crackles  ?Cardiovascular system: Sinus tachycardia ?Gastrointestinal system: Abdomen is nondistended, soft and nontender. ?Central nervous system: Alert and oriented ?Extremities: Atrophy of bilateral lower extremities, wound on the left ankle wrapped with dressing ?Skin: Ulcer on the sacrum ? ? ?Pressure Injury Buttocks Right Stage 2 -  Partial thickness loss of dermis presenting as a shallow open injury with a red, pink wound bed without slough. (Active)  ?   ?  Location: Buttocks  ?Location Orientation: Right  ?Staging: Stage 2 -  Partial thickness loss of dermis presenting as a shallow open injury with a red, pink wound bed without slough.  ?Wound Description (Comments):   ?Present on Admission: Yes  ? ?  ? ?Data Reviewed: I have personally reviewed following labs and imaging studies ? ?CBC: ?Recent Labs  ?Lab 05/29/21 ?1406 05/30/21 ?0116 05/30/21 ?1003 05/31/21 ?2423 06/01/21 ?0346  ?WBC 20.5* 17.2*  --  21.5* 21.2*  ?NEUTROABS 16.7*  --   --  17.9* 17.2*  ?HGB 7.0* 6.2* 7.4* 7.1* 7.1*  ?HCT 26.3* 22.7* 25.7* 25.1* 24.9*  ?MCV 69.6* 69.0*  --  72.3* 72.8*  ?PLT 663* 573*  --  450* 432*  ? ?Basic Metabolic Panel: ?Recent Labs  ?Lab 05/29/21 ?1406 05/29/21 ?2150 05/30/21 ?0116 05/31/21 ?5361 06/01/21 ?0346  ?NA 136  --  133* 136 137  ?K 3.0*  --  3.2* 2.8* 3.0*  ?CL 106  --  107 110 112*  ?CO2 22  --  22 19* 21*   ?GLUCOSE 89  --  102* 74 77  ?BUN 7  --  8 <5* 9  ?CREATININE 0.45*  --  0.43* 0.39* 0.33*  ?CALCIUM 7.8*  --  7.4* 7.5* 7.3*  ?MG  --  1.9  --  1.5*  --   ? ? ? ?Recent Results (from the past 240 hour(s))  ?B

## 2021-06-01 NOTE — Plan of Care (Signed)
?  Problem: Clinical Measurements: ?Goal: Diagnostic test results will improve ?Outcome: Progressing ?Goal: Signs and symptoms of infection will decrease ?Outcome: Progressing ?  ?Problem: Fluid Volume: ?Goal: Hemodynamic stability will improve ?Outcome: Progressing ?  ?Problem: Respiratory: ?Goal: Ability to maintain adequate ventilation will improve ?Outcome: Progressing ?  ?Problem: Clinical Measurements: ?Goal: Ability to maintain clinical measurements within normal limits will improve ?Outcome: Progressing ?Goal: Will remain free from infection ?Outcome: Progressing ?Goal: Diagnostic test results will improve ?Outcome: Progressing ?Goal: Respiratory complications will improve ?Outcome: Progressing ?Goal: Cardiovascular complication will be avoided ?Outcome: Progressing ?  ?Problem: Nutrition: ?Goal: Adequate nutrition will be maintained ?Outcome: Progressing ?  ?Problem: Elimination: ?Goal: Will not experience complications related to bowel motility ?Outcome: Progressing ?Goal: Will not experience complications related to urinary retention ?Outcome: Progressing ?  ?Problem: Safety: ?Goal: Ability to remain free from injury will improve ?Outcome: Progressing ?  ?Problem: Skin Integrity: ?Goal: Risk for impaired skin integrity will decrease ?Outcome: Progressing ?  ?

## 2021-06-01 NOTE — Plan of Care (Signed)
°  Problem: Fluid Volume: °Goal: Hemodynamic stability will improve °Outcome: Progressing °  °Problem: Clinical Measurements: °Goal: Diagnostic test results will improve °Outcome: Progressing °Goal: Signs and symptoms of infection will decrease °Outcome: Progressing °  °

## 2021-06-02 ENCOUNTER — Inpatient Hospital Stay (HOSPITAL_COMMUNITY): Payer: 59

## 2021-06-02 ENCOUNTER — Inpatient Hospital Stay (HOSPITAL_COMMUNITY): Payer: 59 | Admitting: Anesthesiology

## 2021-06-02 ENCOUNTER — Encounter (HOSPITAL_COMMUNITY)
Admission: EM | Disposition: A | Discharge status: Home / Self Care | Payer: Self-pay | Source: Home / Self Care | Attending: Internal Medicine

## 2021-06-02 ENCOUNTER — Other Ambulatory Visit: Payer: Self-pay

## 2021-06-02 ENCOUNTER — Encounter (HOSPITAL_COMMUNITY): Payer: Self-pay | Admitting: Internal Medicine

## 2021-06-02 DIAGNOSIS — S91002A Unspecified open wound, left ankle, initial encounter: Secondary | ICD-10-CM | POA: Diagnosis not present

## 2021-06-02 DIAGNOSIS — D72829 Elevated white blood cell count, unspecified: Secondary | ICD-10-CM | POA: Diagnosis not present

## 2021-06-02 DIAGNOSIS — M86272 Subacute osteomyelitis, left ankle and foot: Secondary | ICD-10-CM | POA: Diagnosis not present

## 2021-06-02 DIAGNOSIS — M009 Pyogenic arthritis, unspecified: Secondary | ICD-10-CM

## 2021-06-02 DIAGNOSIS — M869 Osteomyelitis, unspecified: Secondary | ICD-10-CM

## 2021-06-02 DIAGNOSIS — L02415 Cutaneous abscess of right lower limb: Secondary | ICD-10-CM

## 2021-06-02 DIAGNOSIS — A419 Sepsis, unspecified organism: Secondary | ICD-10-CM | POA: Diagnosis not present

## 2021-06-02 HISTORY — PX: I & D EXTREMITY: SHX5045

## 2021-06-02 LAB — MAGNESIUM: Magnesium: 1.9 mg/dL (ref 1.7–2.4)

## 2021-06-02 LAB — CBC
HCT: 25.1 % — ABNORMAL LOW (ref 39.0–52.0)
Hemoglobin: 7.1 g/dL — ABNORMAL LOW (ref 13.0–17.0)
MCH: 20.9 pg — ABNORMAL LOW (ref 26.0–34.0)
MCHC: 28.3 g/dL — ABNORMAL LOW (ref 30.0–36.0)
MCV: 73.8 fL — ABNORMAL LOW (ref 80.0–100.0)
Platelets: 430 10*3/uL — ABNORMAL HIGH (ref 150–400)
RBC: 3.4 MIL/uL — ABNORMAL LOW (ref 4.22–5.81)
RDW: 23.8 % — ABNORMAL HIGH (ref 11.5–15.5)
WBC: 22.2 10*3/uL — ABNORMAL HIGH (ref 4.0–10.5)
nRBC: 0 % (ref 0.0–0.2)

## 2021-06-02 LAB — BASIC METABOLIC PANEL
Anion gap: 4 — ABNORMAL LOW (ref 5–15)
BUN: 7 mg/dL (ref 6–20)
CO2: 23 mmol/L (ref 22–32)
Calcium: 7.3 mg/dL — ABNORMAL LOW (ref 8.9–10.3)
Chloride: 111 mmol/L (ref 98–111)
Creatinine, Ser: 0.37 mg/dL — ABNORMAL LOW (ref 0.61–1.24)
GFR, Estimated: >60 mL/min (ref >60)
Glucose, Bld: 121 mg/dL — ABNORMAL HIGH (ref 70–99)
Potassium: 3.8 mmol/L (ref 3.5–5.1)
Sodium: 138 mmol/L (ref 135–145)

## 2021-06-02 LAB — SURGICAL PCR SCREEN
MRSA, PCR: NEGATIVE
Staphylococcus aureus: POSITIVE — AB

## 2021-06-02 IMAGING — RF DG ANKLE 2V *L*
1 series · 2 of 2 positions shown · non-contrast
Comparison: None.

CLINICAL DATA: Osteotomy, lateral malleolus osteomyelitis.

EXAM:
LEFT ANKLE - 2 VIEW

[Series 1: dg x-ray · 0.14mm/px · 2 of 2 slices shown]
[im 1/2]
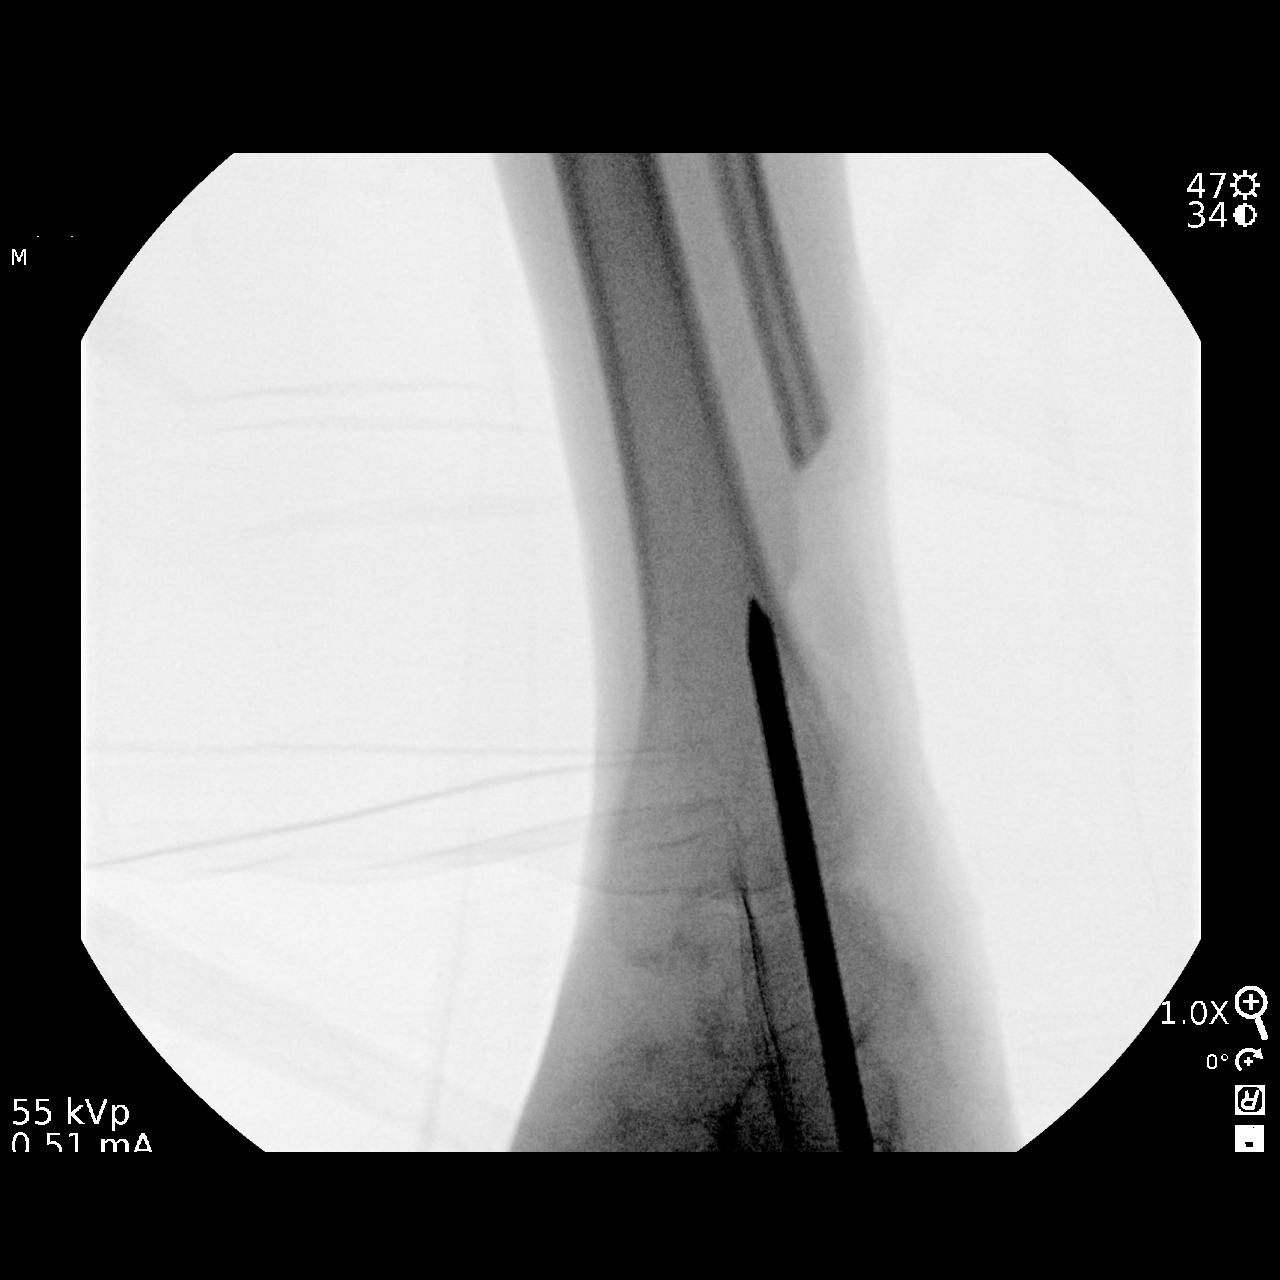
[im 2/2]
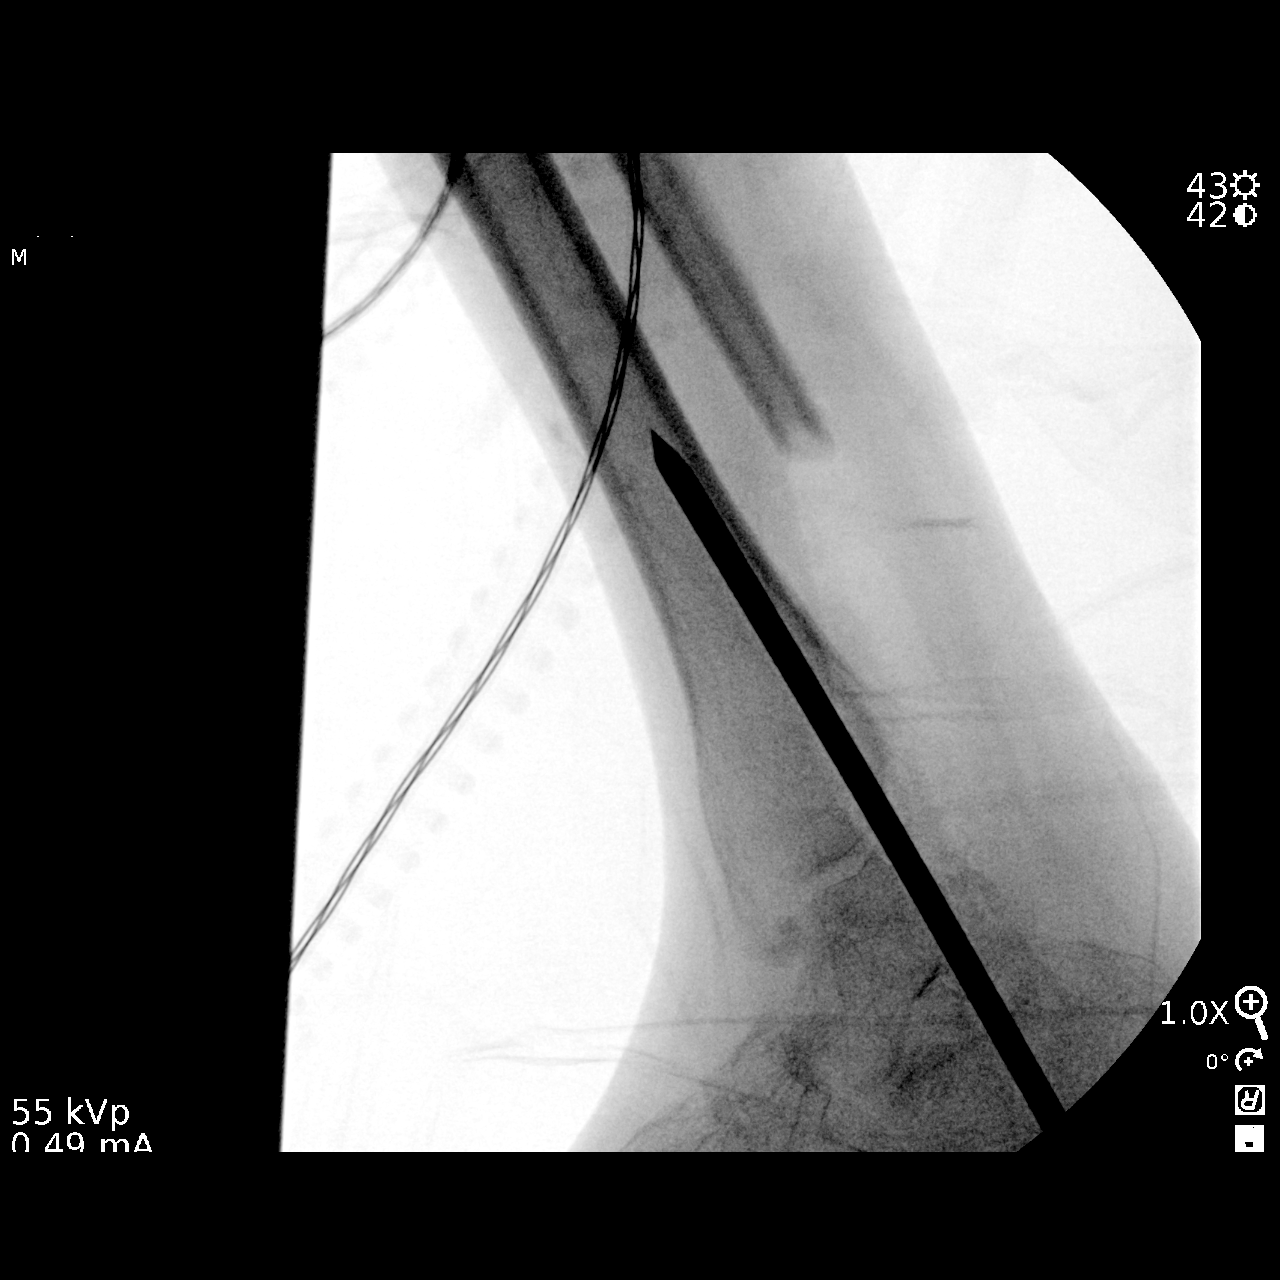

[2 of 2 positions shown; findings below may reference images not displayed]

FINDINGS: Fluoroscopic images were obtained intraoperatively and submitted for
post operative interpretation. Fluoroscopic images demonstrate
resection of the distal fibula, 2 images were obtained with 3
seconds of fluoroscopy time and 0.04 mGy. Please see the performing
provider's procedural report for further detail.
IMPRESSION: As above.

## 2021-06-02 SURGERY — IRRIGATION AND DEBRIDEMENT EXTREMITY
Anesthesia: General | Laterality: Left

## 2021-06-02 MED ORDER — MUPIROCIN 2 % EX OINT
1.0000 "application " | TOPICAL_OINTMENT | Freq: Two times a day (BID) | CUTANEOUS | Status: DC
  Administered 2021-06-02 – 2021-06-05 (×7): 1 via NASAL
  Filled 2021-06-02: qty 22

## 2021-06-02 MED ORDER — DEXAMETHASONE SODIUM PHOSPHATE 10 MG/ML IJ SOLN
INTRAMUSCULAR | Status: AC
  Filled 2021-06-02: qty 1

## 2021-06-02 MED ORDER — METRONIDAZOLE 500 MG PO TABS
500.0000 mg | ORAL_TABLET | Freq: Two times a day (BID) | ORAL | Status: DC
  Administered 2021-06-02 – 2021-06-05 (×7): 500 mg via ORAL
  Filled 2021-06-02 (×7): qty 1

## 2021-06-02 MED ORDER — LIDOCAINE 2% (20 MG/ML) 5 ML SYRINGE
INTRAMUSCULAR | Status: AC
  Filled 2021-06-02: qty 5

## 2021-06-02 MED ORDER — OXYCODONE HCL 5 MG PO TABS: 5.0000 mg | ORAL_TABLET | ORAL | Status: DC | PRN

## 2021-06-02 MED ORDER — METHOCARBAMOL 1000 MG/10ML IJ SOLN
500.0000 mg | Freq: Four times a day (QID) | INTRAVENOUS | Status: DC | PRN
  Filled 2021-06-02: qty 5

## 2021-06-02 MED ORDER — OXYCODONE HCL 5 MG PO TABS: 5.0000 mg | ORAL_TABLET | Freq: Once | ORAL | Status: DC | PRN

## 2021-06-02 MED ORDER — OXYCODONE HCL 5 MG/5ML PO SOLN: 5.0000 mg | Freq: Once | ORAL | Status: DC | PRN

## 2021-06-02 MED ORDER — ORAL CARE MOUTH RINSE: 15.0000 mL | Freq: Once | OROMUCOSAL | Status: AC

## 2021-06-02 MED ORDER — PROPOFOL 10 MG/ML IV BOLUS
INTRAVENOUS | Status: DC | PRN
  Administered 2021-06-02: 150 mg via INTRAVENOUS

## 2021-06-02 MED ORDER — BISACODYL 10 MG RE SUPP: 10.0000 mg | Freq: Every day | RECTAL | Status: DC | PRN

## 2021-06-02 MED ORDER — ONDANSETRON HCL 4 MG/2ML IJ SOLN: 4.0000 mg | Freq: Four times a day (QID) | INTRAMUSCULAR | Status: DC | PRN

## 2021-06-02 MED ORDER — PROPOFOL 10 MG/ML IV BOLUS
INTRAVENOUS | Status: AC
  Filled 2021-06-02: qty 20

## 2021-06-02 MED ORDER — POLYETHYLENE GLYCOL 3350 17 G PO PACK: 17.0000 g | PACK | Freq: Every day | ORAL | Status: DC | PRN

## 2021-06-02 MED ORDER — ONDANSETRON HCL 4 MG/2ML IJ SOLN
INTRAMUSCULAR | Status: DC | PRN
  Administered 2021-06-02: 4 mg via INTRAVENOUS

## 2021-06-02 MED ORDER — CHLORHEXIDINE GLUCONATE 0.12 % MT SOLN: 15.0000 mL | Freq: Once | OROMUCOSAL | Status: AC

## 2021-06-02 MED ORDER — FENTANYL CITRATE (PF) 250 MCG/5ML IJ SOLN
INTRAMUSCULAR | Status: AC
  Filled 2021-06-02: qty 5

## 2021-06-02 MED ORDER — CHLORHEXIDINE GLUCONATE 0.12 % MT SOLN
OROMUCOSAL | Status: AC
  Administered 2021-06-02: 15 mL via OROMUCOSAL
  Filled 2021-06-02: qty 15

## 2021-06-02 MED ORDER — ONDANSETRON HCL 4 MG PO TABS: 4.0000 mg | ORAL_TABLET | Freq: Four times a day (QID) | ORAL | Status: DC | PRN

## 2021-06-02 MED ORDER — CHLORHEXIDINE GLUCONATE CLOTH 2 % EX PADS
6.0000 | MEDICATED_PAD | Freq: Every day | CUTANEOUS | Status: DC
  Administered 2021-06-02 – 2021-06-03 (×2): 6 via TOPICAL

## 2021-06-02 MED ORDER — METOCLOPRAMIDE HCL 5 MG/ML IJ SOLN: 5.0000 mg | Freq: Three times a day (TID) | INTRAMUSCULAR | Status: DC | PRN

## 2021-06-02 MED ORDER — OXYCODONE HCL 5 MG PO TABS: 10.0000 mg | ORAL_TABLET | ORAL | Status: DC | PRN

## 2021-06-02 MED ORDER — METHOCARBAMOL 500 MG PO TABS: 500.0000 mg | ORAL_TABLET | Freq: Four times a day (QID) | ORAL | Status: DC | PRN

## 2021-06-02 MED ORDER — MIDAZOLAM HCL 2 MG/2ML IJ SOLN
INTRAMUSCULAR | Status: AC
  Filled 2021-06-02: qty 2

## 2021-06-02 MED ORDER — DOCUSATE SODIUM 100 MG PO CAPS
100.0000 mg | ORAL_CAPSULE | Freq: Two times a day (BID) | ORAL | Status: DC
  Filled 2021-06-02 (×4): qty 1

## 2021-06-02 MED ORDER — FENTANYL CITRATE (PF) 100 MCG/2ML IJ SOLN: 25.0000 ug | INTRAMUSCULAR | Status: DC | PRN

## 2021-06-02 MED ORDER — GLYCOPYRROLATE PF 0.2 MG/ML IJ SOSY
PREFILLED_SYRINGE | INTRAMUSCULAR | Status: AC
  Filled 2021-06-02: qty 1

## 2021-06-02 MED ORDER — SODIUM CHLORIDE 0.9 % IV SOLN: INTRAVENOUS | Status: DC

## 2021-06-02 MED ORDER — LIDOCAINE HCL (CARDIAC) PF 100 MG/5ML IV SOSY
PREFILLED_SYRINGE | INTRAVENOUS | Status: DC | PRN
  Administered 2021-06-02: 100 mg via INTRATRACHEAL

## 2021-06-02 MED ORDER — ONDANSETRON HCL 4 MG/2ML IJ SOLN
INTRAMUSCULAR | Status: AC
  Filled 2021-06-02: qty 2

## 2021-06-02 MED ORDER — MIDAZOLAM HCL 2 MG/2ML IJ SOLN
INTRAMUSCULAR | Status: DC | PRN
  Administered 2021-06-02: 2 mg via INTRAVENOUS

## 2021-06-02 MED ORDER — METOCLOPRAMIDE HCL 5 MG PO TABS: 5.0000 mg | ORAL_TABLET | Freq: Three times a day (TID) | ORAL | Status: DC | PRN

## 2021-06-02 MED ORDER — HYDROMORPHONE HCL 1 MG/ML IJ SOLN: 0.5000 mg | INTRAMUSCULAR | Status: DC | PRN

## 2021-06-02 MED ORDER — GLYCOPYRROLATE 0.2 MG/ML IJ SOLN
INTRAMUSCULAR | Status: DC | PRN
  Administered 2021-06-02: .2 mg via INTRAVENOUS

## 2021-06-02 MED ORDER — ACETAMINOPHEN 325 MG PO TABS: 325.0000 mg | ORAL_TABLET | Freq: Four times a day (QID) | ORAL | Status: DC | PRN

## 2021-06-02 MED ORDER — 0.9 % SODIUM CHLORIDE (POUR BTL) OPTIME
TOPICAL | Status: DC | PRN
  Administered 2021-06-02: 1000 mL

## 2021-06-02 MED ORDER — LACTATED RINGERS IV SOLN: INTRAVENOUS | Status: DC

## 2021-06-02 MED ORDER — LACTATED RINGERS IV SOLN: INTRAVENOUS | Status: DC | PRN

## 2021-06-02 SURGICAL SUPPLY — 36 items
BAG COUNTER SPONGE SURGICOUNT (BAG) IMPLANT
BAG SPNG CNTER NS LX DISP (BAG)
BLADE SURG 21 STRL SS (BLADE) ×3 IMPLANT
BNDG COHESIVE 6X5 TAN NS LF (GAUZE/BANDAGES/DRESSINGS) ×1 IMPLANT
BNDG COHESIVE 6X5 TAN STRL LF (GAUZE/BANDAGES/DRESSINGS) IMPLANT
BNDG GAUZE ELAST 4 BULKY (GAUZE/BANDAGES/DRESSINGS) ×6 IMPLANT
CANISTER WOUNDNEG PRESSURE 500 (CANNISTER) ×1 IMPLANT
COVER SURGICAL LIGHT HANDLE (MISCELLANEOUS) ×6 IMPLANT
DRAPE DERMATAC (DRAPES) ×2 IMPLANT
DRAPE U-SHAPE 47X51 STRL (DRAPES) ×3 IMPLANT
DRSG ADAPTIC 3X8 NADH LF (GAUZE/BANDAGES/DRESSINGS) ×3 IMPLANT
DURAPREP 26ML APPLICATOR (WOUND CARE) ×3 IMPLANT
ELECT REM PT RETURN 9FT ADLT (ELECTROSURGICAL)
ELECTRODE REM PT RTRN 9FT ADLT (ELECTROSURGICAL) IMPLANT
GAUZE SPONGE 4X4 12PLY STRL (GAUZE/BANDAGES/DRESSINGS) ×3 IMPLANT
GLOVE SURG ORTHO LTX SZ9 (GLOVE) ×3 IMPLANT
GLOVE SURG UNDER POLY LF SZ9 (GLOVE) ×3 IMPLANT
GOWN STRL REUS W/ TWL XL LVL3 (GOWN DISPOSABLE) ×4 IMPLANT
GOWN STRL REUS W/TWL XL LVL3 (GOWN DISPOSABLE) ×4
GRAFT SKIN WND OMEGA3 SB 7X10 (Tissue) ×1 IMPLANT
HANDPIECE INTERPULSE COAX TIP (DISPOSABLE)
KIT BASIN OR (CUSTOM PROCEDURE TRAY) ×3 IMPLANT
KIT PREVENA INCISION MGT20CM45 (CANNISTER) ×1 IMPLANT
KIT TURNOVER KIT B (KITS) ×3 IMPLANT
MANIFOLD NEPTUNE II (INSTRUMENTS) ×3 IMPLANT
NS IRRIG 1000ML POUR BTL (IV SOLUTION) ×3 IMPLANT
PACK ORTHO EXTREMITY (CUSTOM PROCEDURE TRAY) ×3 IMPLANT
PAD ARMBOARD 7.5X6 YLW CONV (MISCELLANEOUS) ×6 IMPLANT
SET HNDPC FAN SPRY TIP SCT (DISPOSABLE) IMPLANT
STOCKINETTE IMPERVIOUS 9X36 MD (GAUZE/BANDAGES/DRESSINGS) IMPLANT
SUT ETHILON 2 0 PSLX (SUTURE) ×3 IMPLANT
SWAB COLLECTION DEVICE MRSA (MISCELLANEOUS) ×3 IMPLANT
SWAB CULTURE ESWAB REG 1ML (MISCELLANEOUS) IMPLANT
TOWEL GREEN STERILE (TOWEL DISPOSABLE) ×3 IMPLANT
TUBE CONNECTING 12X1/4 (SUCTIONS) ×3 IMPLANT
YANKAUER SUCT BULB TIP NO VENT (SUCTIONS) ×3 IMPLANT

## 2021-06-02 NOTE — Anesthesia Procedure Notes (Signed)
Procedure Name: LMA Insertion ?Date/Time: 06/02/2021 2:01 PM ?Performed by: Claris Che, CRNA ?Pre-anesthesia Checklist: Patient identified, Emergency Drugs available, Suction available, Patient being monitored and Timeout performed ?Patient Re-evaluated:Patient Re-evaluated prior to induction ?Oxygen Delivery Method: Circle system utilized ?Preoxygenation: Pre-oxygenation with 100% oxygen ?Induction Type: IV induction ?Ventilation: Mask ventilation without difficulty ?LMA: LMA inserted ?LMA Size: 4.0 ?Number of attempts: 1 ?Placement Confirmation: positive ETCO2 and breath sounds checked- equal and bilateral ?Tube secured with: Tape ?Dental Injury: Teeth and Oropharynx as per pre-operative assessment  ? ? ? ? ?

## 2021-06-02 NOTE — Anesthesia Postprocedure Evaluation (Signed)
Anesthesia Post Note ? ?Patient: Lee Reilly ? ?Procedure(Reilly) Performed: EXCISION LEFT DISTAL FIBULA WOUND CLOSURE, PIN PLACEMENT TO STABILIZE ANKLE (Left) ? ?  ? ?Patient location during evaluation: PACU ?Anesthesia Type: General ?Level of consciousness: awake and alert ?Pain management: pain level controlled ?Vital Signs Assessment: post-procedure vital signs reviewed and stable ?Respiratory status: spontaneous breathing, nonlabored ventilation, respiratory function stable and patient connected to nasal cannula oxygen ?Cardiovascular status: blood pressure returned to baseline and stable ?Postop Assessment: no apparent nausea or vomiting ?Anesthetic complications: no ? ? ?No notable events documented. ? ?Last Vitals:  ?Vitals:  ? 06/02/21 1455 06/02/21 1510  ?BP: 111/74 114/71  ?Pulse: 93 82  ?Resp: (!) 28 (!) 25  ?Temp: 36.6 ?C   ?SpO2: 98% 98%  ?  ?Last Pain:  ?Vitals:  ? 06/02/21 1455  ?TempSrc:   ?PainSc: 0-No pain  ? ? ?  ?  ?  ?  ?  ?  ? ?Lee Reilly,Lee Reilly ? ? ? ? ?

## 2021-06-02 NOTE — Procedures (Signed)
Incision and Drainage Procedure Note ? ?Pre-operative Diagnosis: Right thigh abscess ? ?Post-operative Diagnosis: same ? ?Indications: 8 cm x 4 cm right posterior thigh abscess ? ?Anesthesia: not needed ? ?Procedure Details  ?The procedure, risks and complications have been discussed in detail (including, but not limited to infection, bleeding) with the patient, and the patient has given verbal consent to the procedure. ? ?The skin was gently cleansed. ?Aspiration with an 18G needle was performed on the right posterior thigh abscess. Purulent drainage: present. 1 cm cruciate opening made in overlying skin with scissors. Wound probed to break up any loculations. Copious purulent drainage expressed. Irrigated wound with 20 cc sterile saline. Covered with dry dressing.  ?The patient was observed until stable. ? ?Findings: ?Superficial abscess of R posterior thigh with purulent drainage ? ?EBL: 5 cc's ? ?Drains: None  ? ?Condition: Tolerated procedure well and Stable ? ? ?Complications: ?none. ? ? ?Will send aspirate for culture. Recommend daily packing with iodoform gauze to R posterior thigh abscess cavity. Continue wound care for perineal hidradenitis suppurativa as well.  ? ?Norm Parcel, PA-C ?[facility] ?06/02/2021, 9:43 AM ?Please see Amion for pager number during day hours 7:00am-4:30pm ? ? ?

## 2021-06-02 NOTE — Op Note (Signed)
06/02/2021 ? ?2:49 PM ? ?PATIENT:  Lee Reilly   ? ?PRE-OPERATIVE DIAGNOSIS:  Osteomyelitis Left Ankle ? ?POST-OPERATIVE DIAGNOSIS:  Same ? ?PROCEDURE:  EXCISION LEFT DISTAL FIBULA,  ?PIN PLACEMENT TO STABILIZE ANKLE ?Local tissue rearrangement for wound closure 13 x 5 cm. ?Application of Kerecis biologic tissue graft 7 x 10 cm. ?Application of 20 cm Prevena wound VAC. ?C-arm fluoroscopy to verify tibial calcaneal stabilization. ? ?SURGEON:  Newt Minion, MD ? ?PHYSICIAN ASSISTANT:None ?ANESTHESIA:   General ? ?PREOPERATIVE INDICATIONS:  Lee Reilly is a  36 y.o. male with a diagnosis of Osteomyelitis Left Ankle who failed conservative measures and elected for surgical management.   ? ?The risks benefits and alternatives were discussed with the patient preoperatively including but not limited to the risks of infection, bleeding, nerve injury, cardiopulmonary complications, the need for revision surgery, among others, and the patient was willing to proceed. ? ?OPERATIVE IMPLANTS: Kerecis 7x10 cm graft ?Steinman pin ?20 cm prevena vac ? ?'@ENCIMAGES'$ @ ? ?OPERATIVE FINDINGS: Tissue margins were clear no signs of infection within the tibial talar joint.  C arm fluoroscopy verified reduction of the tibial calcaneal alignment. ? ?OPERATIVE PROCEDURE: Patient was brought the operating room and underwent general anesthetic.  After adequate levels anesthesia were obtained patient's left lower extremity was prepped using DuraPrep draped into a sterile field a timeout was called.  An elliptical incision was made around the ulcerative tissue this left a wound that was 13 x 5 cm.  The distal fibula was resected and removed in 1 block of tissue.  Visualization showed the tibial talar joint showed no signs of infection.  The tissue margins were clear.  The wound was irrigated with normal saline.  A Steinmann pin was then placed from the calcaneus into the tibia to stabilize the ankle.  C-arm fluoroscopy verified alignment.   The Kerecis skin graft was placed over the fibular wound to provide biologic tissue reinforcement.  Relaxing incisions were made and local tissue rearrangement was performed to close the wound 13 x 5 cm.  This was closed over the Kerecis skin graft.  A 20 cm Prevena wound VAC was applied this had a good suction fit this was covered in [place] patient was taken to the PACU in stable condition. ? ? ?DISCHARGE PLANNING: ? ?Antibiotic duration: 24 hours antibiotics postoperatively ? ?Weightbearing: Not applicable ? ?Pain medication: Opioid pathway ? ?Dressing care/ Wound VAC: Continue wound VAC for 1 week ? ?Ambulatory devices: Not applicable ? ?Discharge to: Anticipate discharge back to home ? ?Follow-up: In the office 1 week post operative. ? ? ? ? ? ? ?  ?

## 2021-06-02 NOTE — Progress Notes (Signed)
Pt returned to room 5N27 via bed after surgery. Received report from Andee Poles, [place] in PACU. See reassessment. Will continue to monitor.  ?

## 2021-06-02 NOTE — Transfer of Care (Signed)
Immediate Anesthesia Transfer of Care Note ? ?Patient: Lee Reilly ? ?Procedure(s) Performed: EXCISION LEFT DISTAL FIBULA WOUND CLOSURE, PIN PLACEMENT TO STABILIZE ANKLE (Left) ? ?Patient Location: PACU ? ?Anesthesia Type:General ? ?Level of Consciousness: awake and alert  ? ?Airway & Oxygen Therapy: Patient Spontanous Breathing ? ?Post-op Assessment: Report given to RN and Post -op Vital signs reviewed and stable ? ?Post vital signs: Reviewed and stable ? ?Last Vitals:  ?Vitals Value Taken Time  ?BP 111/74 06/02/21 1455  ?Temp 36.6 ?C 06/02/21 1455  ?Pulse 85 06/02/21 1457  ?Resp 36 06/02/21 1457  ?SpO2 97 % 06/02/21 1457  ?Vitals shown include unvalidated device data. ? ?Last Pain:  ?Vitals:  ? 06/02/21 1305  ?TempSrc: Oral  ?PainSc: 0-No pain  ?   ? ?  ? ?Complications: No notable events documented. ?

## 2021-06-02 NOTE — Progress Notes (Signed)
? ?Progress Note ? ?   ?Subjective: ?Pt going to OR for debridement of ankle wound today with ortho. Re-examined perineal wounds related to hidradenitis and noted large R thigh posterior superficial abscess - patient was agreeable for me to drain at bedside.  ? ?Objective: ?Vital signs in last 24 hours: ?Temp:  [97.7 ?F (36.5 ?C)-99.9 ?F (37.7 ?C)] 98.5 ?F (36.9 ?C) (03/24 0845) ?Pulse Rate:  [76-107] 107 (03/24 0845) ?Resp:  [16-26] 19 (03/24 0845) ?BP: (91-116)/(56-69) 100/65 (03/24 0845) ?SpO2:  [98 %-100 %] 99 % (03/24 0845) ?Last BM Date : 06/01/21 ? ?Intake/Output from previous day: ?03/23 0701 - 03/24 0700 ?In: 897.5 [P.O.:240; I.V.:500; IV Piggyback:157.5] ?Out: -  ?Intake/Output this shift: ?No intake/output data recorded. ? ?PE: ?General: pleasant, WD, thin male who is laying in bed in NAD ?HEENT: head is normocephalic, atraumatic.  Sclera are noninjected.  PERRL.  Ears and nose without any masses or lesions.  Mouth is pink and moist ?Heart: sinus tachycardia in the 110s ?Lungs: Respiratory effort nonlabored. No wheezing  ?Abd: soft, NT, ND ?GU: chronic skin changes and ulcerations that appear like hidradenitis in perineum extending to groin and suprapubic abdomen - this appears stable ?MS: obvious fluctuant area to right posterior thigh that measured ~ 8 cm x 4 cm, wound to L lateral ankle not examined, chronic contractures of BLE. ?Skin: warm and dry with no rashes ?Neuro: paraplegia, follows commands and speech is clear ?Psych: A&Ox3 with an appropriate affect. ? ?Lab Results:  ?Recent Labs  ?  06/01/21 ?0346 06/02/21 ?3474  ?WBC 21.2* 22.2*  ?HGB 7.1* 7.1*  ?HCT 24.9* 25.1*  ?PLT 432* 430*  ? ?BMET ?Recent Labs  ?  06/01/21 ?0346 06/02/21 ?2595  ?NA 137 138  ?K 3.0* 3.8  ?CL 112* 111  ?CO2 21* 23  ?GLUCOSE 77 121*  ?BUN 9 7  ?CREATININE 0.33* 0.37*  ?CALCIUM 7.3* 7.3*  ? ?PT/INR ?No results for input(s): LABPROT, INR in the last 72 hours. ?CMP  ?   ?Component Value Date/Time  ? NA 138 06/02/2021 0338   ? K 3.8 06/02/2021 0338  ? CL 111 06/02/2021 0338  ? CO2 23 06/02/2021 0338  ? GLUCOSE 121 (H) 06/02/2021 0338  ? BUN 7 06/02/2021 0338  ? CREATININE 0.37 (L) 06/02/2021 6387  ? CREATININE 0.45 (L) 12/24/2014 1618  ? CALCIUM 7.3 (L) 06/02/2021 0338  ? PROT 5.5 (L) 05/31/2021 0641  ? ALBUMIN <1.5 (L) 05/31/2021 0641  ? AST 9 (L) 05/31/2021 0641  ? ALT 8 05/31/2021 0641  ? ALKPHOS 53 05/31/2021 0641  ? BILITOT 0.7 05/31/2021 0641  ? GFRNONAA >60 06/02/2021 0338  ? ?Lipase  ?No results found for: LIPASE ? ? ? ? ?Studies/Results: ?No results found. ? ?Anti-infectives: ?Anti-infectives (From admission, onward)  ? ? Start     Dose/Rate Route Frequency Ordered Stop  ? 06/02/21 0600  ceFAZolin (ANCEF) IVPB 2g/100 mL premix       ? 2 g ?200 mL/hr over 30 Minutes Intravenous On call to O.R. 06/01/21 2215 06/03/21 0559  ? 06/01/21 2000  DAPTOmycin (CUBICIN) 450 mg in sodium chloride 0.9 % IVPB       ? 8 mg/kg ? 54.4 kg ?118 mL/hr over 30 Minutes Intravenous Daily 06/01/21 0919    ? 05/31/21 2200  cefTRIAXone (ROCEPHIN) 2 g in sodium chloride 0.9 % 100 mL IVPB       ? 2 g ?200 mL/hr over 30 Minutes Intravenous Every 24 hours 05/31/21 1503    ?  05/30/21 0800  vancomycin (VANCOREADY) IVPB 750 mg/150 mL  Status:  Discontinued       ? 750 mg ?150 mL/hr over 60 Minutes Intravenous Every 12 hours 05/29/21 2001 06/01/21 0919  ? 05/30/21 0600  metroNIDAZOLE (FLAGYL) IVPB 500 mg  Status:  Discontinued       ? 500 mg ?100 mL/hr over 60 Minutes Intravenous Every 12 hours 05/30/21 0027 05/31/21 1503  ? 05/29/21 2300  ceFEPIme (MAXIPIME) 2 g in sodium chloride 0.9 % 100 mL IVPB  Status:  Discontinued       ? 2 g ?200 mL/hr over 30 Minutes Intravenous Every 8 hours 05/29/21 1754 05/31/21 1503  ? 05/29/21 2200  ceFEPIme (MAXIPIME) 2 g in sodium chloride 0.9 % 100 mL IVPB  Status:  Discontinued       ? 2 g ?200 mL/hr over 30 Minutes Intravenous Every 8 hours 05/29/21 1746 05/29/21 1754  ? 05/29/21 1500  ceFEPIme (MAXIPIME) 2 g in sodium  chloride 0.9 % 100 mL IVPB       ? 2 g ?200 mL/hr over 30 Minutes Intravenous  Once 05/29/21 1456 05/29/21 1544  ? 05/29/21 1500  metroNIDAZOLE (FLAGYL) IVPB 500 mg       ? 500 mg ?100 mL/hr over 60 Minutes Intravenous  Once 05/29/21 1456 05/29/21 1922  ? 05/29/21 1500  vancomycin (VANCOCIN) IVPB 1000 mg/200 mL premix       ? 1,000 mg ?200 mL/hr over 60 Minutes Intravenous  Once 05/29/21 1456 05/29/21 2059  ? ?  ? ? ? ?Assessment/Plan ?Hidradenitis suppurativa of perianal, R buttock and bilateral groin ?- largely stable - did develop abscess of R posterior thigh as described below but no other fluctuant areas that would benefit from acute drainage ?- recommend sitz baths as able or warm compresses followed by wound care ?- recommend abx - IV or PO x 6 weeks are typical standard for this along with topical clindamycin ?- would recommend outpatient dermatology follow up ?- will assess again Monday  ?- no indication for urgent/emergent general surgery intervention  ? ?Right posterior thigh abscess, superficial ?- s/p bedside I&D today, see separate procedure note ?- aspirated purulent material sent for cx ?- recommend daily packing with iodoform gauze and dry dressing over ?- will re-examine Monday  ?- I called and updated his brother  ?  ?FEN: NPO, IVF ?VTE: SCDs ?ID: ancef, rocephin, daptomycin, flagyl ?  ?- below per TRH -  ?Sepsis - possibly from L ankle as well as hidradenitis and right posterior thigh abscess, temperature curve improved, WBC 22. Blood cxs negative, sent wound cx today ?L lateral ankle wound with osteomyelitis and possible septic arthritis - per ortho, planning OR today ?Paraplegia with neurogenic bladder  ?Severe protein calorie malnutrition  ?Microcytic anemia  ? LOS: 4 days  ? ?I reviewed last 24 hr labs and vital signs. Completed bedside procedure as listed in separate procedure note. Discussed with orthopedic surgery and hospitalist.  ? ? ?Norm Parcel, PA-C ?[facility] ?06/02/2021, 9:37 AM ?Please see Amion for pager number during day hours 7:00am-4:30pm ? ?

## 2021-06-02 NOTE — Interval H&P Note (Signed)
History and Physical Interval Note: ? ?06/02/2021 ?6:49 AM ? ?Lee Reilly  has presented today for surgery, with the diagnosis of Osteomyelitis Left Ankle.  The various methods of treatment have been discussed with the patient and family. After consideration of risks, benefits and other options for treatment, the patient has consented to  Procedure(s): ?EXCISION LEFT DISTAL FIBULA WOUND CLOSURE, PIN PLACEMENT TO STABILIZE ANKLE (Left) as a surgical intervention.  The patient's history has been reviewed, patient examined, no change in status, stable for surgery.  I have reviewed the patient's chart and labs.  Questions were answered to the patient's satisfaction.   ? ? ?Newt Minion ? ? ?

## 2021-06-02 NOTE — Plan of Care (Signed)
  Problem: Pain Managment: Goal: General experience of comfort will improve Outcome: Progressing   Problem: Safety: Goal: Ability to remain free from injury will improve Outcome: Progressing   Problem: Skin Integrity: Goal: Risk for impaired skin integrity will decrease Outcome: Progressing   

## 2021-06-02 NOTE — Progress Notes (Signed)
Pre-op report called and given to [facility], Therapist, sports in [employer] All questions answered to satisfaction. OR personnel to transport pt.   ?

## 2021-06-02 NOTE — Progress Notes (Signed)
? ?[facility] Infectious Diseases Follow Up Note ? ?Patient Identification: ?Patient Name: Lee Reilly MRN: [id number] [place] Date: 05/29/2021 12:52 PM ?Age: 36 y.o.Today's Date: 06/02/2021 ? ? ?Reason for Visit: leukocytosis  ? ?Principal Problem: ?  Sepsis ([place]) ?Active Problems: ?  Paraplegia ([place]) ?  Neurogenic bladder ?  Ankle wound, left, initial encounter ?  Hypokalemia ?  Microcytic anemia ?  Protein-calorie malnutrition, severe ([place]) ?  Pressure injury of skin ?  Subacute osteomyelitis, left ankle and foot ([place]) ?  Leukocytosis ? ?Antibiotics:  ?Vancomycin 3/20-c ?Metronidazole 3/20 3/1 ?Cefepime 3/20-3/22, ceftriaxone 3/22-c ?  ?Lines/Hardware: ? ?Interval Events: continues to be afebrile with no new concerns, WBC gradually uptrending at 22 ? ?Assessment ?# Left Lateral ankle chronic wound/osteomyelitis/Tibiotalar septic arthritis/cellulitis/tenosynovitis with recent increased foul smelling drainage ?# Left calcaneal ulcer  ? ?# RT posterior thigh abscess ( 8 cm * 4cm) in the setting of Hidradenitis suppurativa ?- s/p I and D 3/24. Wound cx sent  ? ?# Leukocytosis- expect it to downtrend with source control  ?  ?# Microcytic Anemia - hb was 12 in 2017, he mentioned few drop of blood coming out of the left ankle wound. He also reported being vegan. His Hb dropped to 6 this admission requiring transfusion. Defer work up to [employer] ?  ?H/o Neuroblastoma with T4 paraplegia  ?Neurogenic bladder/Fecal Incontinence  ? ?Recommendations ?Continue Vancomycin, ceftriaxone. Will add metronidazole to cover anaerobes given now he has rt thigh abscess ?Fu blood cultures, wound cultures from 3/24 ?Monitor CBC w diff, fevers  ?Anticipate need for prolonged abtx given septic arthritis and osteomyelitis depending upon growth from wound/OR cultures  ?Following peripherally over the weekend. Please call with questions  ? ?Rest of the management as per the primary  team. ?Thank you for the consult. Please page with pertinent questions or concerns. ? ?______________________________________________________________________ ?Subjective ?Patient in OR and could not be examined  ? ?Vitals ?BP 100/65 (BP Location: Left Arm)   Pulse (!) 107   Temp 98.5 ?F (36.9 ?C) (Oral)   Resp 19   Ht '5\' 3"'$  (1.6 m)   Wt 54.4 kg   SpO2 99%   BMI 21.26 kg/m?  ? ?Pertinent Microbiology ?Results for orders placed or performed during the hospital encounter of 05/29/21  ?Blood Culture (routine x 2)     Status: None (Preliminary result)  ? Collection Time: 05/29/21  2:04 PM  ? Specimen: BLOOD  ?Result Value Ref Range Status  ? Specimen Description   Final  ?  BLOOD RIGHT ANTECUBITAL ?Performed at [facility], [place] [address] [place], [place] [address] ?  ? Special Requests   Final  ?  BOTTLES DRAWN AEROBIC AND ANAEROBIC Blood Culture adequate volume ?Performed at [facility], [place] [address] [place], [place] [address] ?  ? Culture   Final  ?  NO GROWTH 4 DAYS ?Performed at [facility], [place] [address] [place], [place] [address] ?  ? Report Status PENDING  Incomplete  ?Blood Culture (routine x 2)     Status: None (Preliminary result)  ? Collection Time: 05/29/21  3:00 PM  ? Specimen: BLOOD  ?Result Value Ref Range Status  ? Specimen Description   Final  ?  BLOOD SITE NOT SPECIFIED ?Performed at [facility], [place] [address], [place],  [address] ?  ? Special Requests   Final  ?  BOTTLES DRAWN AEROBIC AND ANAEROBIC Blood Culture adequate volume ?Performed at [facility],  [place] [address] [place], [place] [address] ?  ? Culture   Final  ?  NO GROWTH 4 DAYS ?Performed at [facility], [place] [address] [place], [place] [address] ?  ? Report Status PENDING  Incomplete  ?Resp Panel by RT-PCR (Flu A&B, Covid) Peripheral     Status: None  ? Collection Time: 05/29/21  3:08 PM  ? Specimen: Peripheral; Nasopharyngeal(NP) swabs in  vial transport medium  ?Result Value Ref Range Status  ? SARS Coronavirus 2 by RT PCR NEGATIVE NEGATIVE Final  ?  Comment: (NOTE) ?SARS-CoV-2 target nucleic acids are NOT DETECTED. ? ?The SARS-CoV-2 RNA is generally detectable in upper respiratory ?specimens during the acute phase of infection. The lowest ?concentration of SARS-CoV-2 viral copies this assay can detect is ?138 copies/mL. A negative result does not preclude SARS-Cov-2 ?infection and should not be used as the sole basis for treatment or ?other patient management decisions. A negative result may occur with  ?improper specimen collection/handling, submission of specimen other ?than nasopharyngeal swab, presence of viral mutation(s) within the ?areas targeted by this assay, and inadequate number of viral ?copies(<138 copies/mL). A negative result must be combined with ?clinical observations, patient history, and epidemiological ?information. The expected result is Negative. ? ?Fact Sheet for Patients:  ?EntrepreneurPulse.com.au ? ?Fact Sheet for Healthcare Providers:  ?IncredibleEmployment.be ? ?This test is no t yet approved or cleared by the [place] FDA and  ?has been authorized for detection and/or diagnosis of SARS-CoV-2 by ?FDA under an Emergency Use Authorization (EUA). This EUA will remain  ?in effect (meaning this test can be used) for the duration of the ?COVID-19 declaration under Section 564(b)(1) of the Act, 21 ?U.S.C.section 360bbb-3(b)(1), unless the authorization is terminated  ?or revoked sooner.  ? ? ?  ? Influenza A by PCR NEGATIVE NEGATIVE Final  ? Influenza B by PCR NEGATIVE NEGATIVE Final  ?  Comment: (NOTE) ?The Xpert Xpress SARS-CoV-2/FLU/RSV plus assay is intended as an aid ?in the diagnosis of influenza from Nasopharyngeal swab specimens and ?should not be used as a sole basis for treatment. Nasal washings and ?aspirates are unacceptable for Xpert Xpress SARS-CoV-2/FLU/RSV ?testing. ? ?Fact  Sheet for Patients: ?EntrepreneurPulse.com.au ? ?Fact Sheet for Healthcare Providers: ?IncredibleEmployment.be ? ?This test is not yet approved or cleared by the [place] FDA and ?has been authorized for detection and/or diagnosis of SARS-CoV-2 by ?FDA under an Emergency Use Authorization (EUA). This EUA will remain ?in effect (meaning this test can be used) for the duration of the ?COVID-19 declaration under Section 564(b)(1) of the Act, 21 U.S.C. ?section 360bbb-3(b)(1), unless the authorization is terminated or ?revoked. ? ?Performed at [facility], [place] [address] ?[place], [place] [address] ?  ?MRSA Next Gen by PCR, Nasal     Status: None  ? Collection Time: 05/31/21  4:40 PM  ? Specimen: Nasal Mucosa; Nasal Swab  ?Result Value Ref Range Status  ? MRSA by PCR Next Gen NOT DETECTED NOT DETECTED Final  ?  Comment: (NOTE) ?The GeneXpert MRSA Assay (FDA approved for NASAL specimens only), ?is one component of a comprehensive MRSA colonization surveillance ?program. It is not intended to diagnose MRSA infection nor to guide ?or monitor treatment for MRSA infections. ?Test performance is not FDA approved in patients less than 2 years ?old. ?Performed at [facility], [place] [address], [place], [place] ?[address] ?  ?Surgical pcr screen     Status: Abnormal  ? Collection Time: 06/01/21  5:04 PM  ?  Specimen: Nasal Mucosa; Nasal Swab  ?Result Value Ref Range Status  ? MRSA, PCR NEGATIVE NEGATIVE Final  ? Staphylococcus aureus POSITIVE (A) NEGATIVE Final  ?  Comment: (NOTE) ?The Xpert SA Assay (FDA approved for NASAL specimens in patients 20 ?years of age and older), is one component of a comprehensive ?surveillance program. It is not intended to diagnose infection nor to ?guide or monitor treatment. ?Performed at [facility], [place] [address], [place], [place] ?[address] ?  ? ? ?Pertinent Lab. ? ?  Latest Ref Rng & Units 06/02/2021  ?  3:38 AM 06/01/2021   ?  3:46 AM 05/31/2021  ?  6:41 AM  ?CBC  ?WBC 4.0 - 10.5 K/uL 22.2   21.2   21.5    ?Hemoglobin 13.0 - 17.0 g/dL 7.1   7.1   7.1    ?Hematocrit 39.0 - 52.0 % 25.1   24.9   25.1    ?Platelets 150 - 400 K/uL 430   4

## 2021-06-02 NOTE — Anesthesia Preprocedure Evaluation (Signed)
Anesthesia Evaluation  ?Patient identified by MRN, date of birth, ID band ?Patient awake ? ? ? ?Reviewed: ?Allergy & Precautions, H&P , NPO status , Patient's Chart, lab work & pertinent test results ? ?Airway ?Mallampati: II ? ? ?Neck ROM: full ? ? ? Dental ?  ?Pulmonary ?asthma , former smoker,  ?  ?breath sounds clear to auscultation ? ? ? ? ? ? Cardiovascular ?negative cardio ROS ? ? ?Rhythm:regular Rate:Normal ? ? ?  ?Neuro/Psych ?Paraplegic from T4 tumor. ?  ? GI/Hepatic ?  ?Endo/Other  ? ? Renal/GU ?  ? ?  ?Musculoskeletal ? ?(+) Arthritis ,  ? Abdominal ?  ?Peds ? Hematology ? ?(+) Blood dyscrasia, anemia ,   ?Anesthesia Other Findings ? ? Reproductive/Obstetrics ? ?  ? ? ? ? ? ? ? ? ? ? ? ? ? ?  ?  ? ? ? ? ? ? ? ? ?Anesthesia Physical ?Anesthesia Plan ? ?ASA: 2 ? ?Anesthesia Plan: General  ? ?Post-op Pain Management:   ? ?Induction: Intravenous ? ?PONV Risk Score and Plan: 2 and Ondansetron, Dexamethasone and Treatment may vary due to age or medical condition ? ?Airway Management Planned: LMA ? ?Additional Equipment:  ? ?Intra-op Plan:  ? ?Post-operative Plan: Extubation in OR ? ?Informed Consent: I have reviewed the patients History and Physical, chart, labs and discussed the procedure including the risks, benefits and alternatives for the proposed anesthesia with the patient or authorized representative who has indicated his/her understanding and acceptance.  ? ? ? ?Dental advisory given ? ?Plan Discussed with: CRNA, Anesthesiologist and Surgeon ? ?Anesthesia Plan Comments:   ? ? ? ? ? ? ?Anesthesia Quick Evaluation ? ?

## 2021-06-02 NOTE — Progress Notes (Addendum)
?PROGRESS NOTE ? ?Lee Reilly  PJK:[id number] DOB: Feb 01, 1986 DOA: 05/29/2021 ?PCP: [facility]  ? 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  Patient was admitted for the suspicion of sepsis, he was hypotensive and tachycardic on presentation.  His hemoglobin was found to be in the range of 6.  He was transfused with a unit of PRBC.  General surgery consulted for evaluation of stage II right buttock wound.  Imaging of the left lower extremity showed acute osteomyelitis of lateral malleolus.  Orthopedics/ID consulted.  Currently on board spectrum antibiotics.  Plan for surgical intervention today by orthopedics ? ?Assessment & Plan: ? ?Principal Problem: ?  Sepsis ([place]) ?Active Problems: ?  Paraplegia ([place]) ?  Neurogenic bladder ?  Ankle wound, left, initial encounter ?  Hypokalemia ?  Microcytic anemia ?  Protein-calorie malnutrition, severe ([place]) ?  Pressure injury of skin ?  Subacute osteomyelitis, left ankle and foot ([place]) ?  Leukocytosis ? ? ?Sepsis: Presented with hypotension, tachycardia.  Blood cultures have been sent.  We will get wound cultures when appropriate.  Continue current antibiotics.  Follow-up cultures:NGTD ?Blood pressure remains low, started on midodrine.  Continue gentle IV fluids. ?Has leukocytosis. ?Source of sepsis is most likely left foot wound. ?Normal lactic acid level.Low procalcitonin. ?ID consulted and following.  ? ?Osteomyelitis of the left lateral malleolus: MRI showed deep soft tissue ulceration at the lateral ankle with ulcer base extending to the underlying lateral malleolus. Acute osteomyelitis of the lateral malleolus. Small tibiotalar joint effusion with thin  synovial enhancement,concerning for septic arthritis.Enhancing tenosynovitis involving the peroneal tendons .  Orthopedics following, plan for surgical intervention  ? ?Hidradenitis suppurativa of perianal, right buttock, bilateral groin: General surgery following.  Underwent incision and drainage of right thigh abscess,, aspirate sent for culture.  General surgery recommending outpatient dermatology follow-up.Also on clindamycin topical gel ? ?Suspected pneumonia: Chest x-ray showed homogeneous opacity in the medial right upper lung fields suggesting atelectasis/pneumonia in the right upper lobe. There is no pleural effusion.  Currently on room air.  Continue current antibiotics ? ?Microcytic anemia/iron deficiency anemia: Hemoglobin was in the range of 6 on presentation.  Transfused with a unit of PRBC.  Hemoglobin  in the range of 7. Iron studies showed low iron, given a dose of IV iron.  Will transfuse if hemoglobin drops less than 7 ? ?Hypokalemia/hypomagnesemia: Supplemented with potassium and magnesium ? ?Debility/T4 paraplegia: Complicated by fecal, urinary incontinence.  Nonambulatory.  Uses wheelchair.  Followed with Dr. Dema Severin for consideration of diverting colostomy he says he declined colostomy ? ?Severe protein calorie malnutrition: Dietitian following ? ? ?Nutrition Problem: Increased nutrient needs ?Etiology: wound healing ?Pressure Injury Buttocks Right Stage 2 -  Partial thickness loss of dermis presenting as a shallow open injury with a red, pink wound bed without slough. (Active)  ?   ?Location: Buttocks  ?Location Orientation: Right  ?Staging: Stage 2 -  Partial thickness loss of dermis presenting as a shallow open injury with a red, pink wound bed without slough.  ?Wound Description (Comments):   ?Present on Admission: Yes  ?Dressing Type Moisture barrier 06/02/21 0856  ? ? ?DVT prophylaxis:SCDs Start: 05/30/21 0028 ? ? ?  Code Status: Full Code ? ?Family Communication: Mother on  phone on  3/22 ?Patient status:Inpatient ? ?Patient is from :Home ? ?Anticipated discharge TZ:GYFV ? ?Estimated DC date:Not sure ? ? ?Consultants: Orthopedics, general surgery,ID ? ?Procedures: None yet ? ?Antimicrobials:  ?Anti-infectives (From admission, onward)  ? ? Start     Dose/Rate Route Frequency Ordered Stop  ? 06/02/21 1100  metroNIDAZOLE (FLAGYL) tablet 500 mg       ? 500 mg Oral Every 12 hours 06/02/21 1006    ? 06/02/21 0600  ceFAZolin (ANCEF) IVPB 2g/100 mL premix       ? 2 g ?200 mL/hr over 30 Minutes Intravenous On call to O.R. 06/01/21 2215 06/03/21 0559  ? 06/01/21 2000  DAPTOmycin (CUBICIN) 450 mg in sodium chloride 0.9 % IVPB       ? 8 mg/kg ? 54.4 kg ?118 mL/hr over 30 Minutes Intravenous Daily 06/01/21 0919    ? 05/31/21 2200  cefTRIAXone (ROCEPHIN) 2 g in sodium chloride 0.9 % 100 mL IVPB       ? 2 g ?200 mL/hr over 30 Minutes Intravenous Every 24 hours 05/31/21 1503    ? 05/30/21 0800  vancomycin (VANCOREADY) IVPB 750 mg/150 mL  Status:  Discontinued       ? 750 mg ?150 mL/hr over 60 Minutes Intravenous Every 12 hours 05/29/21 2001 06/01/21 0919  ? 05/30/21 0600  metroNIDAZOLE (FLAGYL) IVPB 500 mg  Status:  Discontinued       ? 500 mg ?100 mL/hr over 60 Minutes Intravenous Every 12 hours 05/30/21 0027 05/31/21 1503  ? 05/29/21 2300  ceFEPIme (MAXIPIME) 2 g in sodium chloride 0.9 % 100 mL IVPB  Status:  Discontinued       ? 2 g ?200 mL/hr over 30 Minutes Intravenous Every 8 hours 05/29/21 1754 05/31/21 1503  ? 05/29/21 2200  ceFEPIme (MAXIPIME) 2 g in sodium chloride 0.9 % 100 mL IVPB  Status:  Discontinued       ? 2 g ?200 mL/hr over 30 Minutes Intravenous Every 8 hours 05/29/21 1746 05/29/21 1754  ? 05/29/21 1500  ceFEPIme (MAXIPIME) 2 g in sodium chloride 0.9 % 100 mL IVPB       ? 2 g ?200 mL/hr over 30 Minutes Intravenous  Once 05/29/21 1456 05/29/21 1544  ? 05/29/21 1500  metroNIDAZOLE (FLAGYL) IVPB 500 mg       ? 500 mg ?100 mL/hr over 60 Minutes Intravenous  Once 05/29/21 1456 05/29/21 1922  ?  05/29/21 1500  vancomycin (VANCOCIN) IVPB 1000 mg/200 mL premix       ? 1,000 mg ?200 mL/hr over 60 Minutes Intravenous  Once 05/29/21 1456 05/29/21 2059  ? ?  ? ? ?Subjective: ? ?Patient seen and examined at the bedside this morning.  Blood pressure is soft but overall stable.  Pain well controlled.  He was sleeping when I came to the room order this morning.  Denies any new complaints ? ?Objective: ?Vitals:  ? 06/01/21 1955 06/01/21 2344 06/02/21 0302 06/02/21 0845  ?BP: 108/66 116/69 (!) 100/56 100/65  ?Pulse: (!) 107 (!) 106 (!) 107 (!) 107  ?Resp: (!) '26 20 20 19  '$ ?Temp: 99.6 ?F (37.6 ?C) 99.2 ?F (37.3 ?C) 99.9 ?F (37.7 ?C) 98.5 ?F (36.9 ?C)  ?TempSrc: Oral Oral Oral Oral  ?SpO2: 99% 100%  99%  ?Weight:      ?Height:      ? ? ?Intake/Output Summary (Last 24 hours) at 06/02/2021 1128 ?Last data filed at 06/01/2021 1800 ?Gross per 24 hour  ?Intake 897.49  ml  ?Output --  ?Net 897.49 ml  ? ? ?Filed Weights  ? 05/29/21 1507  ?Weight: 54.4 kg  ? ? ?Examination: ? ? ?General exam: Very deconditioned, chronically ill looking, weak ?HEENT: PERRL ?Respiratory system:  no wheezes or crackles  ?Cardiovascular system: Sinus tachycardia.  ?Gastrointestinal system: Abdomen is nondistended, soft and nontender. ?Central nervous system: Alert and oriented ?Extremities: Atrophy of bilateral lower extremities, wound on the left ankle wrapped with dressing, perineal hidradenitis suppurativa wound ?Skin: Sacral wound ? ? ?Pressure Injury Buttocks Right Stage 2 -  Partial thickness loss of dermis presenting as a shallow open injury with a red, pink wound bed without slough. (Active)  ?   ?Location: Buttocks  ?Location Orientation: Right  ?Staging: Stage 2 -  Partial thickness loss of dermis presenting as a shallow open injury with a red, pink wound bed without slough.  ?Wound Description (Comments):   ?Present on Admission: Yes  ? ?  ? ?Data Reviewed: I have personally reviewed following labs and imaging studies ? ?CBC: ?Recent Labs   ?Lab 05/29/21 ?1406 05/30/21 ?0116 05/30/21 ?1003 05/31/21 ?5176 06/01/21 ?0346 06/02/21 ?1607  ?WBC 20.5* 17.2*  --  21.5* 21.2* 22.2*  ?NEUTROABS 16.7*  --   --  17.9* 17.2*  --   ?HGB 7.0* 6.2* 7.4* 7.1* 7.1*

## 2021-06-03 DIAGNOSIS — A419 Sepsis, unspecified organism: Secondary | ICD-10-CM | POA: Diagnosis not present

## 2021-06-03 LAB — CBC
HCT: 25.8 % — ABNORMAL LOW (ref 39.0–52.0)
Hemoglobin: 7.3 g/dL — ABNORMAL LOW (ref 13.0–17.0)
MCH: 21.2 pg — ABNORMAL LOW (ref 26.0–34.0)
MCHC: 28.3 g/dL — ABNORMAL LOW (ref 30.0–36.0)
MCV: 74.8 fL — ABNORMAL LOW (ref 80.0–100.0)
Platelets: 384 10*3/uL (ref 150–400)
RBC: 3.45 MIL/uL — ABNORMAL LOW (ref 4.22–5.81)
RDW: 24.8 % — ABNORMAL HIGH (ref 11.5–15.5)
WBC: 16.2 10*3/uL — ABNORMAL HIGH (ref 4.0–10.5)
nRBC: 0 % (ref 0.0–0.2)

## 2021-06-03 LAB — BASIC METABOLIC PANEL
Anion gap: 5 (ref 5–15)
BUN: 9 mg/dL (ref 6–20)
CO2: 25 mmol/L (ref 22–32)
Calcium: 7.8 mg/dL — ABNORMAL LOW (ref 8.9–10.3)
Chloride: 108 mmol/L (ref 98–111)
Creatinine, Ser: 0.35 mg/dL — ABNORMAL LOW (ref 0.61–1.24)
GFR, Estimated: >60 mL/min (ref >60)
Glucose, Bld: 174 mg/dL — ABNORMAL HIGH (ref 70–99)
Potassium: 4.1 mmol/L (ref 3.5–5.1)
Sodium: 138 mmol/L (ref 135–145)

## 2021-06-03 LAB — CULTURE, BLOOD (ROUTINE X 2)
Culture: NO GROWTH
Culture: NO GROWTH
Special Requests: ADEQUATE
Special Requests: ADEQUATE

## 2021-06-03 LAB — CK: Total CK: 32 U/L — ABNORMAL LOW (ref 49–397)

## 2021-06-03 NOTE — Progress Notes (Signed)
ID Brief Note  ? ?Remains afebrile ?WBC down from 22 to 16 now with source control procedure now in rt posterior thigh as well as left ankle.  ? ?Rt leg I and D wound cx with GPC in pairs 3/23 ? ?Discussed with Dr Sharol Given regarding OR on 3/24 with no concerns for remaining osteomyelitis as well as no signs of septic tibial talar joint. ? ?Given no concerns for septic arthritis and remaining osteomyelitis, will not do prolonged course of IV abtx as stated previously.  ? ?Plan to do short course approx 2 weeks, preferably PO abtx depending on growth from rt thigh wound cultures ?Following cultures for final abtx recs ? ?Rosiland Oz, MD ?Infectious Disease Physician ?[facility] ?[place] [address] ?[place], [place] [address] ?Phone: [phone]  Fax: [phone] ? ? ? ? ?

## 2021-06-03 NOTE — Progress Notes (Signed)
? ?[facility] Infectious Diseases Follow Up Note ? ?Patient Identification: ?Patient Name: Lee Reilly MRN: [id number] [place] Date: 05/29/2021 12:52 PM ?Age: 36 y.o.Today's Date: 06/03/2021 ? ? ?Reason for Visit: leukocytosis/abscess  ? ?Principal Problem: ?  Sepsis ([place]) ?Active Problems: ?  Paraplegia ([place]) ?  Neurogenic bladder ?  Ankle wound, left, initial encounter ?  Hypokalemia ?  Microcytic anemia ?  Protein-calorie malnutrition, severe ([place]) ?  Pressure injury of skin ?  Subacute osteomyelitis, left ankle and foot ([place]) ?  Leukocytosis ?  Abscess of right thigh ?  Septic arthritis of right foot ([place]) ? ?Antibiotics:  ?Vancomycin 3/20-c ?Metronidazole 3/20-3/21, 3/24-c1 ?Cefepime 3/20-3/22, ceftriaxone 3/22-c ?  ?Lines/Hardware: ? ?Interval Events: continues to be afebrile with no new concerns, leukocytosis is down trending after OR ? ?Assessment ?# Left Lateral ankle chronic wound/osteomyelitis/Tibiotalar septic arthritis/cellulitis/tenosynovitis with recent increased foul smelling drainage ?-S/p left distal tibia excision and pin placement for ankle stabilization, application  of kerecis biologic tissue graft and wound vac application 1/06.  ?-Discussed with Dr Sharol Given regarding OR on 3/24 with no concerns for remaining osteomyelitis as well as no signs of septic tibial talar joint. ? ?# RT posterior thigh abscess ( 8 cm * 4cm) in the setting of Hidradenitis suppurativa ?- s/p I and D 3/24. Wound cx with GPC in pairs in gram stain, no growth in cultures in day 1  ? ?# Leukocytosis- downtrending with source control  ?  ?# Microcytic Anemia - hb was 12 in 2017, he mentioned few drop of blood coming out of the left ankle wound. He also reported being vegan. His Hb dropped to 6 this admission requiring transfusion. 6/8 today. Defer work up to [employer] ?  ?H/o Neuroblastoma with T4 paraplegia  ?Neurogenic bladder/Fecal Incontinence   ? ?Recommendations ?Continue Vancomycin, ceftriaxone and metronidazole while in the hospital  ?Fu rt thigh wound cultures. If nothing grows and ready to go home from surgery in terms of rt thigh abscess, would plan for PO abtx for discharge ( Doxycycline and Augmentin for 2 weeks) ?Hb 6.8 today in the setting of OR, defer transfusion to primary team.  ?D/w Primary  ? ?Rest of the management as per the primary team. ?Thank you for the consult. Please page with pertinent questions or concerns. ? ?______________________________________________________________________ ?Subjective ?Patient sleeping in bed,  ?No concerns ?He is expecting to go home tomorrow ? ?Vitals ?BP (!) 100/56 (BP Location: Left Arm)   Pulse 79   Temp 98.3 ?F (36.8 ?C) (Oral)   Resp 20   Ht '5\' 3"'$  (1.6 m)   Wt 51.7 kg   SpO2 99%   BMI 20.19 kg/m?  ? ?Sleeping in bed comfortably with head down ?Breathing comfortably on room air  ?Left ankle bandaged with wound vac+ ?Posterior rt thigh is also bandaged with overlying C/D/I dressing  ? ?Pertinent Microbiology ?Results for orders placed or performed during the hospital encounter of 05/29/21  ?Blood Culture (routine x 2)     Status: None  ? Collection Time: 05/29/21  2:04 PM  ? Specimen: BLOOD  ?Result Value Ref Range Status  ? Specimen Description   Final  ?  BLOOD RIGHT ANTECUBITAL ?Performed at [facility], [place] [address], [place], [place] [address] ?  ? Special Requests   Final  ?  BOTTLES DRAWN AEROBIC AND ANAEROBIC Blood Culture adequate volume ?Performed at [facility], [place] [address] [place], [place] [address] ?  ? Culture   Final  ?  NO GROWTH 5 DAYS ?Performed at [facility], [place] [address] [place], [place] [address] ?  ? Report Status 06/03/2021 FINAL  Final  ?Blood Culture (routine x 2)     Status: None  ? Collection Time: 05/29/21  3:00 PM  ? Specimen: BLOOD  ?Result Value Ref Range Status  ? Specimen Description   Final  ?  BLOOD SITE  NOT SPECIFIED ?Performed at [facility], [place] [address] [place], [place] [address] ?  ? Special Requests   Final  ?  BOTTLES DRAWN AEROBIC AND ANAEROBIC Blood Culture adequate volume ?Performed at [facility], [place] [address], [place], [place] [address] ?  ? Culture   Final  ?  NO GROWTH 5 DAYS ?Performed at [facility], [place] [address] [place], [place] [address] ?  ? Report Status 06/03/2021 FINAL  Final  ?Resp Panel by RT-PCR (Flu A&B, Covid) Peripheral     Status: None  ? Collection Time: 05/29/21  3:08 PM  ? Specimen: Peripheral; Nasopharyngeal(NP) swabs in vial transport medium  ?Result Value Ref Range Status  ? SARS Coronavirus 2 by RT PCR NEGATIVE NEGATIVE Final  ?  Comment: (NOTE) ?SARS-CoV-2 target nucleic acids are NOT DETECTED. ? ?The SARS-CoV-2 RNA is generally detectable in upper respiratory ?specimens during the acute phase of infection. The lowest ?concentration of SARS-CoV-2 viral copies this assay can detect is ?138 copies/mL. A negative result does not preclude SARS-Cov-2 ?infection and should not be used as the sole basis for treatment or ?other patient management decisions. A negative result may occur with  ?improper specimen collection/handling, submission of specimen other ?than nasopharyngeal swab, presence of viral mutation(s) within the ?areas targeted by this assay, and inadequate number of viral ?copies(<138 copies/mL). A negative result must be combined with ?clinical observations, patient history, and epidemiological ?information. The expected result is Negative. ? ?Fact Sheet for Patients:  ?EntrepreneurPulse.com.au ? ?Fact Sheet for Healthcare Providers:  ?IncredibleEmployment.be ? ?This test is no t yet approved or cleared by the [place] FDA and  ?has been authorized for detection and/or diagnosis of SARS-CoV-2 by ?FDA under an Emergency Use Authorization (EUA). This EUA will remain  ?in effect (meaning this  test can be used) for the duration of the ?COVID-19 declaration under Section 564(b)(1) of the Act, 21 ?U.S.C.section 360bbb-3(b)(1), unless the authorization is terminated  ?or revoked sooner.  ? ? ?  ? Influenza A by PCR NEGATIVE NEGATIVE Final  ? Influenza B by PCR NEGATIVE NEGATIVE Final  ?  Comment: (NOTE) ?The Xpert Xpress SARS-CoV-2/FLU/RSV plus assay is intended as an aid ?in the diagnosis of influenza from Nasopharyngeal swab specimens and ?should not be used as a sole basis for treatment. Nasal washings and ?aspirates are unacceptable for Xpert Xpress SARS-CoV-2/FLU/RSV ?testing. ? ?Fact Sheet for Patients: ?EntrepreneurPulse.com.au ? ?Fact Sheet for Healthcare Providers: ?IncredibleEmployment.be ? ?This test is not yet approved or cleared by the [place] FDA and ?has been authorized for detection and/or diagnosis of SARS-CoV-2 by ?FDA under an Emergency Use Authorization (EUA). This EUA will remain ?in effect (meaning this test can be used) for the duration of the ?COVID-19 declaration under Section 564(b)(1) of the Act, 21 U.S.C. ?section 360bbb-3(b)(1), unless the authorization is terminated or ?revoked. ? ?Performed at [facility], [place] [address] ?[place], [place] [address] ?  ?MRSA Next Gen by PCR, Nasal     Status: None  ? Collection Time: 05/31/21  4:40 PM  ?  Specimen: Nasal Mucosa; Nasal Swab  ?Result Value Ref Range Status  ? MRSA by PCR Next Gen NOT DETECTED NOT DETECTED Final  ?  Comment: (NOTE) ?The GeneXpert MRSA Assay (FDA approved for NASAL specimens only), ?is one component of a comprehensive MRSA colonization surveillance ?program. It is not intended to diagnose MRSA infection nor to guide ?or monitor treatment for MRSA infections. ?Test performance is not FDA approved in patients less than 2 years ?old. ?Performed at [facility], [place] [address] [place], [place] ?[address] ?  ?Surgical pcr screen     Status: Abnormal  ?  Collection Time: 06/01/21  5:04 PM  ? Specimen: Nasal Mucosa; Nasal Swab  ?Result Value Ref Range Status  ? MRSA, PCR NEGATIVE NEGATIVE Final  ? Staphylococcus aureus POSITIVE (A) NEGATIVE Final  ?  Comment: (NOTE) ?The Xpert SA Assay (FDA

## 2021-06-03 NOTE — Progress Notes (Signed)
?PROGRESS NOTE ? ?Lee Reilly  UMP:[id number] DOB: [birth year] DOA: 05/29/2021 ?PCP: [facility]  ? 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  Patient was admitted for the suspicion of sepsis, he was hypotensive and tachycardic on presentation.  His hemoglobin was found to be in the range of 6.  He was transfused with a unit of PRBC.  General surgery consulted for evaluation of stage II right buttock wound.  Imaging of the left lower extremity showed acute osteomyelitis of lateral malleolus.  Orthopedics/ID consulted.  Currently on board spectrum antibiotics.  Orthopedics due to excision of distal fibula with application of skin graft and application of wound VAC.  ID recommending to follow-up wound culture before deciding on antibiotics for discharge planning ? ?Assessment & Plan: ? ?Principal Problem: ?  Sepsis ([place]) ?Active Problems: ?  Paraplegia ([place]) ?  Neurogenic bladder ?  Ankle wound, left, initial encounter ?  Hypokalemia ?  Microcytic anemia ?  Protein-calorie malnutrition, severe ([place]) ?  Pressure injury of skin ?  Subacute osteomyelitis, left ankle and foot ([place]) ?  Leukocytosis ?  Abscess of right thigh ?  Septic arthritis of right foot ([place]) ? ? ?Sepsis: Presented with hypotension, tachycardia.  Blood cultures have been sent, no growth till date.  Continue current antibiotics.   ?Blood pressure remains stable now on  midodrine.  ?Has leukocytosis, but improving now ?ID consulted and following.  Waiting for wound culture report of right thigh wound to decide on oral antibiotic, planning for 2 weeks duration ? ?Osteomyelitis of the left lateral malleolus: MRI showed deep soft tissue  ulceration at the lateral ankle with ulcer base extending to the underlying lateral malleolus. Acute osteomyelitis of the lateral malleolus. Small tibiotalar joint effusion with thin synovial enhancement,concerning for septic arthritis.Enhancing tenosynovitis involving the peroneal tendons .  Status post excision of distal fibula, application of skin graft, application of wound VAC.  He will be discharged on wound VAC when ready and follow-up with Dr. Sharol Given in a week ? ?Hidradenitis suppurativa of perianal, right buttock, bilateral groin: General surgery following.  Underwent incision and drainage of right thigh abscess on 3/24, aspirate sent for culture, showing rare gram-positive cocci in pairs.  General surgery recommending outpatient dermatology follow-up.Also on clindamycin topical gel ? ?Suspected pneumonia: Chest x-ray showed homogeneous opacity in the medial right upper lung fields suggesting atelectasis/pneumonia in the right upper lobe. There is no pleural effusion.  Currently on room air.  Continue current antibiotics ? ?Microcytic anemia/iron deficiency anemia: Hemoglobin was in the range of 6 on presentation.  Transfused with a unit of PRBC.  Hemoglobin  in the range of 7. Iron studies showed low iron, given a dose of IV iron.  Will transfuse if hemoglobin drops less than 7 ? ?Hypokalemia/hypomagnesemia: Supplemented with potassium and magnesium ? ?Debility/T4 paraplegia: Complicated by fecal, urinary incontinence.  Nonambulatory.  Uses wheelchair.  Followed with Dr. Dema Severin for consideration of diverting colostomy he says he declined colostomy.  Patient declined PT/OT but family is interested in rehab so consulted PT/OT today ? ?Severe protein calorie malnutrition: Dietitian following ? ? ?Nutrition Problem: Increased nutrient needs ?Etiology: wound healing ?Pressure Injury Buttocks Right Stage 2 -  Partial thickness loss of dermis presenting as a  shallow open injury with a red, pink wound bed without  slough. (Active)  ?   ?Location: Buttocks  ?Location Orientation: Right  ?Staging: Stage 2 -  Partial thickness loss of dermis presenting as a shallow open injury with a red, pink wound bed without slough.  ?Wound Description (Comments):   ?Present on Admission: Yes  ?Dressing Type Moisture barrier 06/02/21 2000  ? ? ?DVT prophylaxis:SCDs Start: 06/02/21 1539 ?SCDs Start: 05/30/21 0028 ? ? ?  Code Status: Full Code ? ?Family Communication: Called and discussed with brother on phone on 3/25 ?Patient status:Inpatient ? ?Patient is from :Home ? ?Anticipated discharge GG:YIRS ? ?Estimated DC date:Not sure ? ? ?Consultants: Orthopedics, general surgery,ID ? ?Procedures: None yet ? ?Antimicrobials:  ?Anti-infectives (From admission, onward)  ? ? Start     Dose/Rate Route Frequency Ordered Stop  ? 06/02/21 1100  metroNIDAZOLE (FLAGYL) tablet 500 mg       ? 500 mg Oral Every 12 hours 06/02/21 1006    ? 06/02/21 0600  ceFAZolin (ANCEF) IVPB 2g/100 mL premix       ? 2 g ?200 mL/hr over 30 Minutes Intravenous On call to O.R. 06/01/21 2215 06/02/21 1412  ? 06/01/21 2000  DAPTOmycin (CUBICIN) 450 mg in sodium chloride 0.9 % IVPB       ? 8 mg/kg ? 54.4 kg ?118 mL/hr over 30 Minutes Intravenous Daily 06/01/21 0919    ? 05/31/21 2200  cefTRIAXone (ROCEPHIN) 2 g in sodium chloride 0.9 % 100 mL IVPB       ? 2 g ?200 mL/hr over 30 Minutes Intravenous Every 24 hours 05/31/21 1503    ? 05/30/21 0800  vancomycin (VANCOREADY) IVPB 750 mg/150 mL  Status:  Discontinued       ? 750 mg ?150 mL/hr over 60 Minutes Intravenous Every 12 hours 05/29/21 2001 06/01/21 0919  ? 05/30/21 0600  metroNIDAZOLE (FLAGYL) IVPB 500 mg  Status:  Discontinued       ? 500 mg ?100 mL/hr over 60 Minutes Intravenous Every 12 hours 05/30/21 0027 05/31/21 1503  ? 05/29/21 2300  ceFEPIme (MAXIPIME) 2 g in sodium chloride 0.9 % 100 mL IVPB  Status:  Discontinued       ? 2 g ?200 mL/hr over 30 Minutes Intravenous Every 8 hours 05/29/21 1754 05/31/21 1503  ? 05/29/21  2200  ceFEPIme (MAXIPIME) 2 g in sodium chloride 0.9 % 100 mL IVPB  Status:  Discontinued       ? 2 g ?200 mL/hr over 30 Minutes Intravenous Every 8 hours 05/29/21 1746 05/29/21 1754  ? 05/29/21 1500  ceFEPIme (MAXIPIME) 2 g in sodium chloride 0.9 % 100 mL IVPB       ? 2 g ?200 mL/hr over 30 Minutes Intravenous  Once 05/29/21 1456 05/29/21 1544  ? 05/29/21 1500  metroNIDAZOLE (FLAGYL) IVPB 500 mg       ? 500 mg ?100 mL/hr over 60 Minutes Intravenous  Once 05/29/21 1456 05/29/21 1922  ? 05/29/21 1500  vancomycin (VANCOCIN) IVPB 1000 mg/200 mL premix       ? 1,000 mg ?200 mL/hr over 60 Minutes Intravenous  Once 05/29/21 1456 05/29/21 2059  ? ?  ? ? ?Subjective: ? ?Patient seen and examined at the bedside this morning.  Hemodynamically stable today.  Blood pressure, heart rate better today.  Denies any significant pain.  Eager to go home.  We explained about the importance of getting wound culture before discharge.   ? ?Objective: ?Vitals:  ?  06/03/21 0025 06/03/21 0400 06/03/21 0551 06/03/21 0737  ?BP: 110/67  106/62 101/67  ?Pulse: 78 72 66 96  ?Resp: (!) '21 20 19   '$ ?Temp: 98.2 ?F (36.8 ?C)   97.6 ?F (36.4 ?C)  ?TempSrc: Oral   Oral  ?SpO2: 99% 98% 100% 98%  ?Weight:      ?Height:      ? ? ?Intake/Output Summary (Last 24 hours) at 06/03/2021 1106 ?Last data filed at 06/03/2021 0500 ?Gross per 24 hour  ?Intake 1345.28 ml  ?Output 60 ml  ?Net 1285.28 ml  ? ? ?Filed Weights  ? 05/29/21 1507 06/02/21 1305  ?Weight: 54.4 kg 51.7 kg  ? ? ?Examination: ? ? ? ?General exam: Very deconditioned, chronically ill looking ?HEENT: PERRL ?Respiratory system:  no wheezes or crackles  ?Cardiovascular system: S1 & S2 heard, RRR.  ?Gastrointestinal system: Abdomen is nondistended, soft and nontender. ?Central nervous system: Alert and oriented ?Extremities: Atrophy of bilateral lower extremities, contractures, left foot wrapped with dressing, wound VAC, right thigh I&D wound ?Skin: Sacral wound ? ? ?Pressure Injury Buttocks Right Stage 2  -  Partial thickness loss of dermis presenting as a shallow open injury with a red, pink wound bed without slough. (Active)  ?   ?Location: Buttocks  ?Location Orientation: Right  ?Staging: Stage 2 -  Partial th

## 2021-06-03 NOTE — Progress Notes (Deleted)
Pharmacy Antibiotic Note ? ?HASHEM GOYNES is a 36 y.o. male admitted on 05/29/2021 with left ankle wound infection.  Pharmacy has been consulted for cefepime dosing. ? ?Plan: ?Cefepime 2 gm IV q 8h x 7 days ?Vancomycin 1 gm loading dose followed by vancomycin 750 mg IV q12 hr x 7 days for estimated AUC 449.8 using TBW 54.4 kg and SCr rounded up to 0.8 ?F/u renal fxn, WBC, temp, cultures ?Vancomycin levels as needed ? ?Height: '5\' 3"'$  (160 cm) ?Weight: 51.7 kg (114 lb) ?IBW/kg (Calculated) : 56.9 ? ?Temp (24hrs), Avg:98.1 ?F (36.7 ?C), Min:97.6 ?F (36.4 ?C), Max:98.3 ?F (36.8 ?C) ? ?Recent Labs  ?Lab 05/29/21 ?1404 05/29/21 ?1406 05/29/21 ?2951 05/30/21 ?8841 05/30/21 ?6606 05/30/21 ?1129 05/31/21 ?3016 06/01/21 ?0109 06/02/21 ?3235 06/03/21 ?0510  ?WBC  --    < >  --  17.2*  --   --  21.5* 21.2* 22.2* 16.2*  ?CREATININE  --    < >  --  0.43*  --   --  0.39* 0.33* 0.37* 0.35*  ?LATICACIDVEN 2.5*  --  1.1  --  0.8 0.8  --   --   --   --   ? < > = values in this interval not displayed.  ? ?  ?Estimated Creatinine Clearance: 92.4 mL/min (A) (by C-G formula based on SCr of 0.35 mg/dL (L)).   ? ?No Known Allergies ?Antimicrobials this admission:  ?3/20 vanc >> ?3/20 cefepime >> ?3/20 flagyl>> ?Dose adjustments this admission:  ? ?Microbiology results:  ?3/20 BCx2: ? ?Thank you for allowing pharmacy to be a part of this patient?s care. ? ?Eudelia Bunch, Pharm.D ?06/03/2021 3:56 PM ? ?

## 2021-06-03 NOTE — Plan of Care (Signed)
?  Problem: Elimination: ?Goal: Will not experience complications related to bowel motility ?Outcome: Progressing ?Goal: Will not experience complications related to urinary retention ?Outcome: Progressing ?  ?Problem: Nutrition: ?Goal: Adequate nutrition will be maintained ?Outcome: Progressing ?  ?Problem: Pain Managment: ?Goal: General experience of comfort will improve ?Outcome: Progressing ?  ?Problem: Safety: ?Goal: Ability to remain free from injury will improve ?Outcome: Progressing ?  ?Problem: Skin Integrity: ?Goal: Risk for impaired skin integrity will decrease ?Outcome: Progressing ?  ?Problem: Clinical Measurements: ?Goal: Ability to maintain clinical measurements within normal limits will improve ?Outcome: Progressing ?Goal: Will remain free from infection ?Outcome: Progressing ?Goal: Diagnostic test results will improve ?Outcome: Progressing ?Goal: Respiratory complications will improve ?Outcome: Progressing ?Goal: Cardiovascular complication will be avoided ?Outcome: Progressing ?  ?

## 2021-06-03 NOTE — Progress Notes (Signed)
1 Day Post-Op  ? ?Subjective/Chief Complaint: ?PT HAS NO COMPLAINTS ? ? ?Objective: ?Vital signs in last 24 hours: ?Temp:  [97.2 ?F (36.2 ?C)-98.2 ?F (36.8 ?C)] 97.6 ?F (36.4 ?C) (03/25 6812) ?Pulse Rate:  [66-96] 96 (03/25 0737) ?Resp:  [16-28] 19 (03/25 0551) ?BP: (98-118)/(61-74) 101/67 (03/25 0737) ?SpO2:  [97 %-100 %] 98 % (03/25 0737) ?Weight:  [51.7 kg] 51.7 kg (03/24 1305) ?Last BM Date : 06/02/21 ? ?Intake/Output from previous day: ?03/24 0701 - 03/25 0700 ?In: 1445.3 [P.O.:240; I.V.:887.3; IV Piggyback:318] ?Out: 60 [Drains:50; Blood:10] ?Intake/Output this shift: ?No intake/output data recorded. ? ?RIGHT POSTERIOR THIGH  WOUND CLEAN INTACT ? ?Lab Results:  ?Recent Labs  ?  06/02/21 ?7517 06/03/21 ?0510  ?WBC 22.2* 16.2*  ?HGB 7.1* 7.3*  ?HCT 25.1* 25.8*  ?PLT 430* 384  ? ?BMET ?Recent Labs  ?  06/02/21 ?0017 06/03/21 ?0510  ?NA 138 138  ?K 3.8 4.1  ?CL 111 108  ?CO2 23 25  ?GLUCOSE 121* 174*  ?BUN 7 9  ?CREATININE 0.37* 0.35*  ?CALCIUM 7.3* 7.8*  ? ?PT/INR ?No results for input(s): LABPROT, INR in the last 72 hours. ?ABG ?No results for input(s): PHART, HCO3 in the last 72 hours. ? ?Invalid input(s): PCO2, PO2 ? ?Studies/Results: ?DG Ankle 2 Views Left ? ?Result Date: 06/02/2021 ?CLINICAL DATA:  Osteotomy, lateral malleolus osteomyelitis. EXAM: LEFT ANKLE - 2 VIEW COMPARISON:  None. FINDINGS: Fluoroscopic images were obtained intraoperatively and submitted for post operative interpretation. Fluoroscopic images demonstrate resection of the distal fibula, 2 images were obtained with 3 seconds of fluoroscopy time and 0.04 mGy. Please see the performing provider's procedural report for further detail. IMPRESSION: As above. Electronically Signed   By: Yetta Glassman M.D.   On: 06/02/2021 14:57  ? ?DG C-Arm 1-60 Min-No Report ? ?Result Date: 06/02/2021 ?Fluoroscopy was utilized by the requesting physician.  No radiographic interpretation.   ? ?Anti-infectives: ?Anti-infectives (From admission, onward)  ? ?  Start     Dose/Rate Route Frequency Ordered Stop  ? 06/02/21 1100  metroNIDAZOLE (FLAGYL) tablet 500 mg       ? 500 mg Oral Every 12 hours 06/02/21 1006    ? 06/02/21 0600  ceFAZolin (ANCEF) IVPB 2g/100 mL premix       ? 2 g ?200 mL/hr over 30 Minutes Intravenous On call to O.R. 06/01/21 2215 06/02/21 1412  ? 06/01/21 2000  DAPTOmycin (CUBICIN) 450 mg in sodium chloride 0.9 % IVPB       ? 8 mg/kg ? 54.4 kg ?118 mL/hr over 30 Minutes Intravenous Daily 06/01/21 0919    ? 05/31/21 2200  cefTRIAXone (ROCEPHIN) 2 g in sodium chloride 0.9 % 100 mL IVPB       ? 2 g ?200 mL/hr over 30 Minutes Intravenous Every 24 hours 05/31/21 1503    ? 05/30/21 0800  vancomycin (VANCOREADY) IVPB 750 mg/150 mL  Status:  Discontinued       ? 750 mg ?150 mL/hr over 60 Minutes Intravenous Every 12 hours 05/29/21 2001 06/01/21 0919  ? 05/30/21 0600  metroNIDAZOLE (FLAGYL) IVPB 500 mg  Status:  Discontinued       ? 500 mg ?100 mL/hr over 60 Minutes Intravenous Every 12 hours 05/30/21 0027 05/31/21 1503  ? 05/29/21 2300  ceFEPIme (MAXIPIME) 2 g in sodium chloride 0.9 % 100 mL IVPB  Status:  Discontinued       ? 2 g ?200 mL/hr over 30 Minutes Intravenous Every 8 hours 05/29/21 1754 05/31/21 1503  ?  05/29/21 2200  ceFEPIme (MAXIPIME) 2 g in sodium chloride 0.9 % 100 mL IVPB  Status:  Discontinued       ? 2 g ?200 mL/hr over 30 Minutes Intravenous Every 8 hours 05/29/21 1746 05/29/21 1754  ? 05/29/21 1500  ceFEPIme (MAXIPIME) 2 g in sodium chloride 0.9 % 100 mL IVPB       ? 2 g ?200 mL/hr over 30 Minutes Intravenous  Once 05/29/21 1456 05/29/21 1544  ? 05/29/21 1500  metroNIDAZOLE (FLAGYL) IVPB 500 mg       ? 500 mg ?100 mL/hr over 60 Minutes Intravenous  Once 05/29/21 1456 05/29/21 1922  ? 05/29/21 1500  vancomycin (VANCOCIN) IVPB 1000 mg/200 mL premix       ? 1,000 mg ?200 mL/hr over 60 Minutes Intravenous  Once 05/29/21 1456 05/29/21 2059  ? ?  ? ? ?Assessment/Plan: ?s/p Procedure(s): ?EXCISION LEFT DISTAL FIBULA WOUND CLOSURE, PIN PLACEMENT TO  STABILIZE ANKLE (Left) ?Start dressing changes  ?Wound clean  ? LOS: 5 days  ? ? ?Turner Daniels MD  ?06/03/2021 ? ?

## 2021-06-03 NOTE — Evaluation (Signed)
Physical Therapy Evaluation ? ?Patient Details ?Name: Lee Reilly ?MRN: [id number] ?DOB: 07-13-1985 ?Today's Date: 06/03/2021 ? ?History of Present Illness ? Pt is a 36 y/o male who presents 05/29/2021 with worsening of wound on his left leg. Patient was admitted for the suspicion of sepsis, he was hypotensive and tachycardic on presentation.  Imaging of the LLE showed acute osteomyelitis of lateral malleolus.  Orthopedics/ID consulted and pt now s/p excision of distal fibula with application of skin graft and application of wound VAC on 06/02/2021. 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 ?  ?Clinical Impression ? Pt admitted with above diagnosis. Pt currently with functional limitations due to the deficits listed below (see PT Problem List). At the time of PT eval pt was able to perform transfers with modified independence utilizing anterior>posterior technique. Pt's mom took wheelchair home to clean it while he is admitted, and will bring back prior to next PT session on Monday. Anticipate pt will not require PT follow up as he is near baseline of function, however pt/mother requesting a home health aide at d/c to assist pt as he lives alone. Acutely, pt will benefit from skilled PT to increase their independence and safety with mobility to allow discharge to the venue listed below.      ?   ? ?Recommendations for follow up therapy are one component of a multi-disciplinary discharge planning process, led by the attending physician.  Recommendations may be updated based on patient status, additional functional criteria and insurance authorization. ? ?Follow Up Recommendations Other (comment) (Pt requesting a home health aide.) ? ?  ?Assistance Recommended at Discharge PRN  ?Patient can return home with the following ? A little help with bathing/dressing/bathroom;Assistance  with cooking/housework;Assist for transportation ? ?  ?Equipment Recommendations None recommended by PT  ?Recommendations for Other Services ?    ?  ?Functional Status Assessment Patient has had a recent decline in their functional status and demonstrates the ability to make significant improvements in function in a reasonable and predictable amount of time.  ? ?  ?Precautions / Restrictions Precautions ?Precautions: Fall ?Precaution Comments: Wound VAC LLE ?Restrictions ?Weight Bearing Restrictions: Yes ?LLE Weight Bearing: Non weight bearing  ? ?  ? ?Mobility ? Bed Mobility ?Overal bed mobility: Modified Independent ?  ?  ?  ?  ?  ?  ?General bed mobility comments: Increased time but pt was able to scoot around and roll without difficulty. ?  ? ?Transfers ?Overall transfer level: Modified independent ?Equipment used: None ?  ?  ?  ?  ?  ?  ?  ?General transfer comment: Pt performed an A>P transfer to the recliner, and a P>A transfer back to bed at end of session. No assist required. ?  ? ?Ambulation/Gait ?  ?  ?  ?  ?  ?  ?  ?General Gait Details: Pt nonambulatory at baseline. ? ?Stairs ?  ?  ?  ?  ?  ? ?Wheelchair Mobility ?  ? ?Modified Rankin (Stroke Patients Only) ?  ? ?  ? ?Balance Overall balance assessment: Mild deficits observed, not formally tested (Within scope of baseline mobility) ?  ?  ?  ?  ?  ?  ?  ?  ?  ?  ?  ?  ?  ?  ?  ?  ?  ?  ?   ? ? ? ?  Pertinent Vitals/Pain Pain Assessment ?Pain Assessment: No/denies pain  ? ? ?Home Living Family/patient expects to be discharged to:: Private residence ?Living Arrangements: Alone ?Available Help at Discharge: Family;Available PRN/intermittently ?Type of Home: House ?Home Access: Level entry ?  ?  ?  ?Home Layout: One level ?Home Equipment: Tub bench;Grab bars - tub/shower;Wheelchair - manual ?   ?  ?Prior Function Prior Level of Function : Independent/Modified Independent;Driving;Working/employed ?  ?  ?  ?  ?  ?  ?Mobility Comments: Independent to a manual  wheelchair ?ADLs Comments: Uses briefs for BM's and urine. 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 ?  ? ? ?Hand Dominance  ?   ? ?  ?Extremity/Trunk Assessment  ? Upper Extremity Assessment ?Upper Extremity Assessment: Overall WFL for tasks assessed ?  ? ?Lower Extremity Assessment ?Lower Extremity Assessment: LLE deficits/detail ?LLE Deficits / Details: Wound VAC in place. ?  ? ?Cervical / Trunk Assessment ?Cervical / Trunk Assessment: Lordotic  ?Communication  ? Communication: No difficulties  ?Cognition Arousal/Alertness: Awake/alert ?Behavior During Therapy: [facility] for tasks assessed/performed ?Overall Cognitive Status: Within Functional Limits for tasks assessed ?  ?  ?  ?  ?  ?  ?  ?  ?  ?  ?  ?  ?  ?  ?  ?  ?  ?  ?  ? ?  ?General Comments   ? ?  ?Exercises    ? ?Assessment/Plan  ?  ?PT Assessment Patient needs continued PT services  ?PT Problem List Decreased activity tolerance;Decreased mobility;Decreased knowledge of use of DME;Decreased safety awareness;Decreased knowledge of precautions ? ?   ?  ?PT Treatment Interventions DME instruction;Functional mobility training;Therapeutic activities;Therapeutic exercise;Balance training;Patient/family education;Wheelchair mobility training   ? ?PT Goals (Current goals can be found in the Care Plan section)  ?Acute Rehab PT Goals ?Patient Stated Goal: Home Monday ?PT Goal Formulation: With patient/family ?Time For Goal Achievement: 06/10/21 ?Potential to Achieve Goals: Good ? ?  ?Frequency Min 3X/week ?  ? ? ?Co-evaluation   ?  ?  ?  ?  ? ? ?  ?AM-PAC PT "6 Clicks" Mobility  ?Outcome Measure Help needed turning from your back to your side while in a flat bed without using bedrails?: None ?Help needed moving from lying on your back to sitting on the side of a flat bed without using bedrails?: None ?Help needed moving to and from a bed to a chair (including a wheelchair)?: None ?Help needed standing up from a chair using  your arms (e.g., wheelchair or bedside chair)?: Total ?Help needed to walk in hospital room?: Total ?Help needed climbing 3-5 steps with a railing? : Total ?6 Click Score: 15 ? ?  ?End of Session   ?Activity Tolerance: Patient tolerated treatment well ?Patient left: in bed;with call bell/phone within reach;with family/visitor present ?Nurse Communication: Mobility status ?PT Visit Diagnosis: Other abnormalities of gait and mobility (R26.89) ?  ? ?Time: 1517-6160 ?PT Time Calculation (min) (ACUTE ONLY): 20 min ? ? ?Charges:   PT Evaluation ?$PT Eval Moderate Complexity: 1 Mod ?  ?  ?   ? ? ?Rolinda Roan, PT, DPT ?Acute Rehabilitation Services ?Pager: [phone] ?Office: [phone]  ? ?Thelma Comp ?06/03/2021, 3:45 PM ? ?

## 2021-06-03 NOTE — Evaluation (Signed)
Occupational Therapy Evaluation ?Patient Details ?Name: Lee Reilly ?MRN: [id number] ?DOB: 09/21/1985 ?Today's Date: 06/03/2021 ? ? ?History of Present Illness Pt is a 36 y/o male who presents 05/29/2021 with worsening of wound on his left leg. Patient was admitted for the suspicion of sepsis, he was hypotensive and tachycardic on presentation.  Imaging of the LLE showed acute osteomyelitis of lateral malleolus.  Orthopedics/ID consulted and pt now s/p excision of distal fibula with application of skin graft and application of wound VAC on 06/02/2021. 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  ? ?Clinical Impression ?  ?Pt reports independence at baseline with ADLs and functional mobility, uses w/c at baseline and is independent with ADLs. Pt able to complete anterior <> posterior transfer independently to recliner. Pt demonstrates good problem solving skills and ability to carefully think through task performance while adhering to precautions. Pt  mod I for bed mobility, and is set up [place] level for ADLs. Pt expressing interest in hiring [facility] aide to assist him at home. Pt presenting with impairments listed below, however close to baseline, will follow acutely to maximize safety and independence with ADLs. ?   ? ?Recommendations for follow up therapy are one component of a multi-disciplinary discharge planning process, led by the attending physician.  Recommendations may be updated based on patient status, additional functional criteria and insurance authorization.  ? ?Follow Up Recommendations ? No OT follow up (Pt expressing interest in hiring [facility] aide) ?  ?Assistance Recommended at Discharge Set up Supervision/Assistance  ?Patient can return home with the following A little help with bathing/dressing/bathroom;Assist for transportation;A little help with walking and/or  transfers ? ?  ?Functional Status Assessment ? Patient has had a recent decline in their functional status and demonstrates the ability to make significant improvements in function in a reasonable and predictable amount of time.  ?Equipment Recommendations ? None recommended by OT;Other (comment) (pt has all necessary DME)  ?  ?Recommendations for Other Services   ? ? ?  ?Precautions / Restrictions Precautions ?Precautions: Fall ?Precaution Comments: Wound VAC LLE ?Restrictions ?Weight Bearing Restrictions: Yes ?LLE Weight Bearing: Non weight bearing  ? ?  ? ?Mobility Bed Mobility ?Overal bed mobility: Modified Independent ?  ?  ?  ?  ?  ?  ?General bed mobility comments: able to roll L/R for pad adjustment, and move EOB with increased time ?  ? ?Transfers ?Overall transfer level: Modified independent ?Equipment used: None ?  ?  ?  ?  ?  ?  ?  ?General transfer comment: performed bed <> chair transfer anterior <> posterior ?  ? ?  ?Balance Overall balance assessment: Mild deficits observed, not formally tested ?  ?  ?  ?  ?  ?  ?  ?  ?  ?  ?  ?  ?  ?  ?  ?  ?  ?  ?   ? ?ADL either performed or assessed with clinical judgement  ? ?ADL Overall ADL's : Needs assistance/impaired ?Eating/Feeding: Set up;Sitting ?  ?Grooming: Set up;Sitting ?  ?Upper Body Bathing: Set up;Sitting ?  ?Lower Body Bathing: Set up;Sitting/lateral leans;Sit to/from stand ?  ?Upper Body Dressing : Set up;Sitting ?  ?Lower Body Dressing: Set up;Sitting/lateral leans ?  ?Toilet Transfer: Supervision/safety ?Toilet Transfer Details (indicate cue type and reason): A <>P or lateral scoot ?Toileting- Clothing Manipulation and Hygiene:  Supervision/safety;Sitting/lateral lean ?  ?  ?  ?Functional mobility during ADLs: Supervision/safety ?   ? ? ? ?Vision   ?Vision Assessment?: No apparent visual deficits  ?   ?Perception   ?  ?Praxis   ?  ? ?Pertinent Vitals/Pain Pain Assessment ?Pain Assessment: No/denies pain  ? ? ? ?Hand Dominance   ?   ?Extremity/Trunk Assessment Upper Extremity Assessment ?Upper Extremity Assessment: Overall WFL for tasks assessed ?  ?Lower Extremity Assessment ?Lower Extremity Assessment: Defer to PT evaluation ?LLE Deficits / Details: Wound VAC in place. ?  ?Cervical / Trunk Assessment ?Cervical / Trunk Assessment: Lordotic ?  ?Communication Communication ?Communication: No difficulties ?  ?Cognition Arousal/Alertness: Awake/alert ?Behavior During Therapy: [facility] for tasks assessed/performed ?Overall Cognitive Status: Within Functional Limits for tasks assessed ?  ?  ?  ?  ?  ?  ?  ?  ?  ?  ?  ?  ?  ?  ?  ?  ?  ?  ?  ?General Comments  pt's parent in room throughout session ? ?  ?Exercises   ?  ?Shoulder Instructions    ? ? ?Home Living Family/patient expects to be discharged to:: Private residence ?Living Arrangements: Alone ?Available Help at Discharge: Family;Available PRN/intermittently ?Type of Home: House ?Home Access: Level entry ?  ?  ?Home Layout: One level ?  ?  ?Bathroom Shower/Tub: Walk-in shower;Curtain ?  ?Bathroom Toilet: Standard ?  ?  ?Home Equipment: Tub bench;Grab bars - tub/shower;Wheelchair - manual ?  ?Additional Comments: reports he has a variety of w/c's at home, all manual ?  ? ?  ?Prior Functioning/Environment Prior Level of Function : Independent/Modified Independent;Driving;Working/employed ?  ?  ?  ?  ?  ?  ?Mobility Comments: Independent to a manual wheelchair ?ADLs Comments: Uses briefs for BM's and urine. 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 ?  ? ?  ?  ?OT Problem List: Decreased range of motion;Decreased activity tolerance;Impaired balance (sitting and/or standing);Decreased knowledge of use of DME or AE ?  ?   ?OT Treatment/Interventions: Self-care/ADL training;Therapeutic exercise;Energy conservation;DME and/or AE instruction;Therapeutic activities  ?  ?OT Goals(Current goals can be found in the care plan section) Acute Rehab OT Goals ?Patient  Stated Goal: to go home ?OT Goal Formulation: With patient ?Time For Goal Achievement: 06/17/21 ?Potential to Achieve Goals: Good ?ADL Goals ?Pt Will Perform Upper Body Dressing: Independently;sitting ?Pt Will Perform Lower Body Dressing: Independently;sitting/lateral leans ?Pt Will Transfer to Toilet: Independently;anterior/posterior transfer;with transfer board;bedside commode;regular height toilet ?Pt Will Perform Tub/Shower Transfer: anterior/posterior transfer;tub bench;with transfer board;with supervision  ?OT Frequency: Min 2X/week ?  ? ?Co-evaluation   ?  ?  ?  ?  ? ?  ?AM-PAC OT "6 Clicks" Daily Activity     ?Outcome Measure Help from another person eating meals?: None ?Help from another person taking care of personal grooming?: None ?Help from another person toileting, which includes using toliet, bedpan, or urinal?: A Little ?Help from another person bathing (including washing, rinsing, drying)?: A Little ?Help from another person to put on and taking off regular upper body clothing?: A Little ?Help from another person to put on and taking off regular lower body clothing?: A Little ?6 Click Score: 20 ?  ?End of Session Nurse Communication: Mobility status ? ?Activity Tolerance: Patient tolerated treatment well ?Patient left: in bed;with call bell/phone within reach;with family/visitor present ? ?OT Visit Diagnosis: Unsteadiness on feet (R26.81);Other abnormalities of gait and  mobility (R26.89);Muscle weakness (generalized) (M62.81)  ?              ?Time: 8377-9396 ?OT Time Calculation (min): 13 min ?Charges:  OT General Charges ?$OT Visit: 1 Visit ?OT Evaluation ?$OT Eval Low Complexity: 1 Low ? ?Lynnda Child, OTD, OTR/L ?Acute Rehab ?[phone] ? ?Kaylyn Lim ?06/03/2021, 3:52 PM ?

## 2021-06-03 NOTE — Progress Notes (Signed)
Subjective: ?1 Day Post-Op Procedure(s) (LRB): ?EXCISION LEFT DISTAL FIBULA WOUND CLOSURE, PIN PLACEMENT TO STABILIZE ANKLE (Left) ?Patient reports pain as insensate with no sensation below T4. No complaints this morning. Awake and alert.  ? ?Objective: ?Vital signs in last 24 hours: ?Temp:  [97.2 ?F (36.2 ?C)-98.5 ?F (36.9 ?C)] 97.6 ?F (36.4 ?C) (03/25 8937) ?Pulse Rate:  [66-107] 96 (03/25 0737) ?Resp:  [16-28] 19 (03/25 0551) ?BP: (98-118)/(61-74) 101/67 (03/25 0737) ?SpO2:  [97 %-100 %] 98 % (03/25 0737) ?Weight:  [51.7 kg] 51.7 kg (03/24 1305) ? ?Intake/Output from previous day: ?03/24 0701 - 03/25 0700 ?In: 1445.3 [P.O.:240; I.V.:887.3; IV Piggyback:318] ?Out: 60 [Drains:50; Blood:10] ?Intake/Output this shift: ?No intake/output data recorded. ? ?Recent Labs  ?  06/01/21 ?0346 06/02/21 ?3428 06/03/21 ?0510  ?HGB 7.1* 7.1* 7.3*  ? ?Recent Labs  ?  06/02/21 ?7681 06/03/21 ?0510  ?WBC 22.2* 16.2*  ?RBC 3.40* 3.45*  ?HCT 25.1* 25.8*  ?PLT 430* 384  ? ?Recent Labs  ?  06/02/21 ?1572 06/03/21 ?0510  ?NA 138 138  ?K 3.8 4.1  ?CL 111 108  ?CO2 23 25  ?BUN 7 9  ?CREATININE 0.37* 0.35*  ?GLUCOSE 121* 174*  ?CALCIUM 7.3* 7.8*  ? ?No results for input(s): LABPT, INR in the last 72 hours. ? ?Compartment soft ?Wound vac in place left lower leg. 100 ml of bloody drainage in canister.  ? ?Assessment/Plan: ?1 Day Post-Op Procedure(s) (LRB): ?EXCISION LEFT DISTAL FIBULA WOUND CLOSURE, PIN PLACEMENT TO STABILIZE ANKLE (Left) ?WBC tending down from 22,000 to 16,000. Afebrile.  ?Continue wound vac for one week . ? ? ? ? ? ? ?Cirilo Canner ?06/03/2021, 8:05 AM ? ?

## 2021-06-03 NOTE — Progress Notes (Signed)
Patient ID: Lee Reilly, male   DOB: 07-Apr-1985, 36 y.o.   MRN: [id number] ?Patient is postoperative day 1 excision of the distal fibula application of skin graft stabilization of the ankle and application of wound VAC.  There is 75 cc in the wound VAC canister.  Patient may discharge to home today from an orthopedic standpoint.  He does not need antibiotics for the ankle.  I will follow-up in the office in 1 week.  A order was written for patient to discharge with the portable Praveena wound VAC pump. ?

## 2021-06-04 DIAGNOSIS — A419 Sepsis, unspecified organism: Secondary | ICD-10-CM | POA: Diagnosis not present

## 2021-06-04 DIAGNOSIS — D72829 Elevated white blood cell count, unspecified: Secondary | ICD-10-CM | POA: Diagnosis not present

## 2021-06-04 DIAGNOSIS — L02415 Cutaneous abscess of right lower limb: Secondary | ICD-10-CM | POA: Diagnosis not present

## 2021-06-04 LAB — CBC
HCT: 24.4 % — ABNORMAL LOW (ref 39.0–52.0)
Hemoglobin: 6.8 g/dL — CL (ref 13.0–17.0)
MCH: 21.8 pg — ABNORMAL LOW (ref 26.0–34.0)
MCHC: 27.9 g/dL — ABNORMAL LOW (ref 30.0–36.0)
MCV: 78.2 fL — ABNORMAL LOW (ref 80.0–100.0)
Platelets: 394 10*3/uL (ref 150–400)
RBC: 3.12 MIL/uL — ABNORMAL LOW (ref 4.22–5.81)
RDW: 26.1 % — ABNORMAL HIGH (ref 11.5–15.5)
WBC: 17.6 10*3/uL — ABNORMAL HIGH (ref 4.0–10.5)
nRBC: 0 % (ref 0.0–0.2)

## 2021-06-04 LAB — BASIC METABOLIC PANEL
Anion gap: 5 (ref 5–15)
BUN: 13 mg/dL (ref 6–20)
CO2: 25 mmol/L (ref 22–32)
Calcium: 7.6 mg/dL — ABNORMAL LOW (ref 8.9–10.3)
Chloride: 110 mmol/L (ref 98–111)
Creatinine, Ser: <0.3 mg/dL — ABNORMAL LOW (ref 0.61–1.24)
Glucose, Bld: 96 mg/dL (ref 70–99)
Potassium: 4 mmol/L (ref 3.5–5.1)
Sodium: 140 mmol/L (ref 135–145)

## 2021-06-04 LAB — HEMOGLOBIN AND HEMATOCRIT, BLOOD
HCT: 30.8 % — ABNORMAL LOW (ref 39.0–52.0)
Hemoglobin: 8.9 g/dL — ABNORMAL LOW (ref 13.0–17.0)

## 2021-06-04 LAB — PREPARE RBC (CROSSMATCH)

## 2021-06-04 MED ORDER — SODIUM CHLORIDE 0.9% IV SOLUTION: Freq: Once | INTRAVENOUS | Status: AC

## 2021-06-04 NOTE — Progress Notes (Signed)
Blood transfusion 1 unit is currently running. No current reaction noted. Blood was given late as Type and Crossmatching was done late. Will continue to monitor pt. ? ?

## 2021-06-04 NOTE — Progress Notes (Signed)
?PROGRESS NOTE ? ?Lee Reilly  MOL:[id number] DOB: 1985-04-02 DOA: 05/29/2021 ?PCP: [facility]  ? 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  Patient was admitted for the suspicion of sepsis, he was hypotensive and tachycardic on presentation.  His hemoglobin was found to be in the range of 6.  He was transfused with a unit of PRBC.  General surgery consulted for evaluation of stage II right buttock wound.  Imaging of the left lower extremity showed acute osteomyelitis of lateral malleolus.  Orthopedics/ID consulted.  Currently on board spectrum antibiotics.  Orthopedics did excision of distal fibula with application of skin graft and application of wound VAC.  ID recommending oral antibiotics on discharge.  Discharge planning to home tomorrow ? ?Assessment & Plan: ? ?Principal Problem: ?  Sepsis ([place]) ?Active Problems: ?  Paraplegia ([place]) ?  Neurogenic bladder ?  Ankle wound, left, initial encounter ?  Hypokalemia ?  Microcytic anemia ?  Protein-calorie malnutrition, severe ([place]) ?  Pressure injury of skin ?  Subacute osteomyelitis, left ankle and foot ([place]) ?  Leukocytosis ?  Abscess of right thigh ?  Septic arthritis of right foot ([place]) ? ? ?Sepsis: Presented with hypotension, tachycardia.  Blood cultures have been sent, no growth till date.  Continue current antibiotics.   ?Blood pressure remains stable now on  midodrine.  ?Has leukocytosis, but improving now ?ID consulted and following.  Recommended Augmentin and doxycycline for 2 weeks duration ? ?Osteomyelitis of the left lateral malleolus: MRI showed deep soft tissue ulceration at the lateral ankle with ulcer base extending to the underlying  lateral malleolus. Acute osteomyelitis of the lateral malleolus. Small tibiotalar joint effusion with thin synovial enhancement,concerning for septic arthritis.Enhancing tenosynovitis involving the peroneal tendons .  Status post excision of distal fibula, application of skin graft, application of wound VAC.  He will be discharged on wound VAC and follow-up with Dr. Sharol Given in a week ? ?Hidradenitis suppurativa of perianal, right buttock, bilateral groin: General surgery following.  Underwent incision and drainage of right thigh abscess on 3/24, aspirate sent for culture, showing gram-positive cocci in pairs.  General surgery recommending outpatient dermatology follow-up.Also on clindamycin topical gel ? ?Suspected pneumonia: Chest x-ray showed homogeneous opacity in the medial right upper lung fields suggesting atelectasis/pneumonia in the right upper lobe. There is no pleural effusion.  Currently on room air.  Continue current antibiotics ? ?Microcytic anemia/iron deficiency anemia: Hemoglobin was in the range of 6 on presentation.  Transfused with a unit of PRBC. Iron studies showed low iron, given a dose of IV iron.  Hemoglobin again dropped to the range of 6, being transfused a unit of PRBC.  Total of 2 units during this hospitalization ? ?Hypokalemia/hypomagnesemia: Supplemented with potassium and magnesium ? ?Debility/T4 paraplegia: Complicated by fecal, urinary incontinence.  Nonambulatory.  Uses wheelchair.  Followed with Dr. Dema Severin for consideration of diverting colostomy he says he declined colostomy.  PT/OT did not recommend any follow-up ? ?Severe protein calorie malnutrition: Dietitian following ? ? ?Nutrition Problem: Increased nutrient needs ?Etiology: wound healing ?Pressure Injury Buttocks Right Stage 2 -  Partial thickness loss of dermis presenting as a shallow open injury with a red, pink wound bed without slough. (Active)  ?   ?Location:  Buttocks  ?Location Orientation: Right  ?Staging: Stage 2 -   Partial thickness loss of dermis presenting as a shallow open injury with a red, pink wound bed without slough.  ?Wound Description (Comments):   ?Present on Admission: Yes  ?Dressing Type Moisture barrier 06/03/21 2000  ? ? ?DVT prophylaxis:SCDs Start: 06/02/21 1539 ?SCDs Start: 05/30/21 0028 ? ? ?  Code Status: Full Code ? ?Family Communication: Called and discussed with brother on phone on 3/25 ?Patient status:Inpatient ? ?Patient is from :Home ? ?Anticipated discharge [id number] ? ?Estimated DC date: Tomorrow ? ? ?Consultants: Orthopedics, general surgery,ID ? ?Procedures: None yet ? ?Antimicrobials:  ?Anti-infectives (From admission, onward)  ? ? Start     Dose/Rate Route Frequency Ordered Stop  ? 06/02/21 1100  metroNIDAZOLE (FLAGYL) tablet 500 mg       ? 500 mg Oral Every 12 hours 06/02/21 1006    ? 06/02/21 0600  ceFAZolin (ANCEF) IVPB 2g/100 mL premix       ? 2 g ?200 mL/hr over 30 Minutes Intravenous On call to O.R. 06/01/21 2215 06/02/21 1412  ? 06/01/21 2000  DAPTOmycin (CUBICIN) 450 mg in sodium chloride 0.9 % IVPB       ? 8 mg/kg ? 54.4 kg ?118 mL/hr over 30 Minutes Intravenous Daily 06/01/21 0919    ? 05/31/21 2200  cefTRIAXone (ROCEPHIN) 2 g in sodium chloride 0.9 % 100 mL IVPB       ? 2 g ?200 mL/hr over 30 Minutes Intravenous Every 24 hours 05/31/21 1503    ? 05/30/21 0800  vancomycin (VANCOREADY) IVPB 750 mg/150 mL  Status:  Discontinued       ? 750 mg ?150 mL/hr over 60 Minutes Intravenous Every 12 hours 05/29/21 2001 06/01/21 0919  ? 05/30/21 0600  metroNIDAZOLE (FLAGYL) IVPB 500 mg  Status:  Discontinued       ? 500 mg ?100 mL/hr over 60 Minutes Intravenous Every 12 hours 05/30/21 0027 05/31/21 1503  ? 05/29/21 2300  ceFEPIme (MAXIPIME) 2 g in sodium chloride 0.9 % 100 mL IVPB  Status:  Discontinued       ? 2 g ?200 mL/hr over 30 Minutes Intravenous Every 8 hours 05/29/21 1754 05/31/21 1503  ? 05/29/21 2200  ceFEPIme (MAXIPIME) 2 g in sodium chloride 0.9 % 100 mL IVPB  Status:  Discontinued        ? 2 g ?200 mL/hr over 30 Minutes Intravenous Every 8 hours 05/29/21 1746 05/29/21 1754  ? 05/29/21 1500  ceFEPIme (MAXIPIME) 2 g in sodium chloride 0.9 % 100 mL IVPB       ? 2 g ?200 mL/hr over 30 Minutes Intravenous  Once 05/29/21 1456 05/29/21 1544  ? 05/29/21 1500  metroNIDAZOLE (FLAGYL) IVPB 500 mg       ? 500 mg ?100 mL/hr over 60 Minutes Intravenous  Once 05/29/21 1456 05/29/21 1922  ? 05/29/21 1500  vancomycin (VANCOCIN) IVPB 1000 mg/200 mL premix       ? 1,000 mg ?200 mL/hr over 60 Minutes Intravenous  Once 05/29/21 1456 05/29/21 2059  ? ?  ? ? ?Subjective: ? ?Patient seen and examined at the bedside this morning.  Sleeping comfortably without any complaints.  Discussed about the discharge planning tomorrow ? ?Objective: ?Vitals:  ? 06/03/21 1530 06/03/21 2100 06/04/21 0529 06/04/21 9417  ?BP: (!) 100/56 115/74 (!) 94/50 (!) 91/56  ?Pulse: 79 77 90 84  ?Resp:  20    ?Temp: 98.3 ?F (36.8 ?C) 97.8 ?F (36.6 ?C)  98.2 ?F (36.8 ?C) 98.6 ?F (37 ?C)  ?TempSrc: Oral Oral Oral Oral  ?SpO2: 99% 100% 100% 100%  ?Weight:      ?Height:      ? ? ?Intake/Output Summary (Last 24 hours) at 06/04/2021 1100 ?Last data filed at 06/03/2021 1330 ?Gross per 24 hour  ?Intake 120 ml  ?Output --  ?Net 120 ml  ? ? ?Filed Weights  ? 05/29/21 1507 06/02/21 1305  ?Weight: 54.4 kg 51.7 kg  ? ? ?Examination: ? ? ?General exam: Deconditioned, chronically ill looking, overall comfortable ?HEENT: PERRL ?Respiratory system:  no wheezes or crackles  ?Cardiovascular system: S1 & S2 heard, RRR.  ?Gastrointestinal system: Abdomen is nondistended, soft and nontender. ?Central nervous system: Alert and oriented ?Extremities: Atrophy of bilateral lower extremities, contractures, left foot wrapped with dressing, wound VAC ?Skin: Sacral wound ? ? ?Pressure Injury Buttocks Right Stage 2 -  Partial thickness loss of dermis presenting as a shallow open injury with a red, pink wound bed without slough. (Active)  ?   ?Location: Buttocks  ?Location  Orientation: Right  ?Staging: Stage 2 -  Partial thickness loss of dermis presenting as a shallow open injury with a red, pink wound bed without slough.  ?Wound Description (Comments):   ?Present on Admission: Yes

## 2021-06-05 DIAGNOSIS — A419 Sepsis, unspecified organism: Secondary | ICD-10-CM | POA: Diagnosis not present

## 2021-06-05 LAB — BPAM RBC
Blood Product Expiration Date: 202304062359
ISSUE DATE / TIME: 202303261245
Unit Type and Rh: 1700

## 2021-06-05 LAB — TYPE AND SCREEN
ABO/RH(D): B NEG
Antibody Screen: NEGATIVE
Unit division: 0

## 2021-06-05 LAB — CBC
HCT: 29.2 % — ABNORMAL LOW (ref 39.0–52.0)
Hemoglobin: 8.5 g/dL — ABNORMAL LOW (ref 13.0–17.0)
MCH: 22.4 pg — ABNORMAL LOW (ref 26.0–34.0)
MCHC: 29.1 g/dL — ABNORMAL LOW (ref 30.0–36.0)
MCV: 76.8 fL — ABNORMAL LOW (ref 80.0–100.0)
Platelets: 376 10*3/uL (ref 150–400)
RBC: 3.8 MIL/uL — ABNORMAL LOW (ref 4.22–5.81)
RDW: 25.7 % — ABNORMAL HIGH (ref 11.5–15.5)
WBC: 18.6 10*3/uL — ABNORMAL HIGH (ref 4.0–10.5)
nRBC: 0 % (ref 0.0–0.2)

## 2021-06-05 MED ORDER — MIDODRINE HCL 5 MG PO TABS: 5.0000 mg | ORAL_TABLET | Freq: Three times a day (TID) | ORAL | 0 refills | Status: AC

## 2021-06-05 MED ORDER — AMOXICILLIN-POT CLAVULANATE 875-125 MG PO TABS: 1.0000 | ORAL_TABLET | Freq: Two times a day (BID) | ORAL | 0 refills | Status: AC

## 2021-06-05 MED ORDER — DOXYCYCLINE HYCLATE 100 MG PO TABS
100.0000 mg | ORAL_TABLET | Freq: Two times a day (BID) | ORAL | 0 refills | Status: AC
Start: 2021-06-05 — End: 2021-06-26

## 2021-06-05 MED ORDER — CLINDAMYCIN PHOSPHATE 1 % EX GEL: Freq: Two times a day (BID) | CUTANEOUS | 2 refills | Status: DC

## 2021-06-05 MED ORDER — FERROUS SULFATE 325 (65 FE) MG PO TABS: 325.0000 mg | ORAL_TABLET | Freq: Every day | ORAL | 3 refills | Status: DC

## 2021-06-05 NOTE — Discharge Summary (Signed)
Physician Discharge Summary  ?Lee Reilly BLT:[id number] DOB: 1985/11/11 DOA: 05/29/2021 ? ?PCP: [facility] ? ?Admit date: 05/29/2021 ?Discharge date: 06/05/2021 ? ?Admitted From: Home ?Disposition:  Home ? ?Discharge Condition:Stable ?CODE STATUS:FULL ?Diet recommendation:  Regular  ? 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  Patient was admitted for the suspicion of sepsis, he was hypotensive and tachycardic on presentation.  His hemoglobin was found to be in the range of 6.  He was transfused with a unit of PRBC.  General surgery consulted for evaluation of stage II right buttock wound.  Imaging of the left lower extremity showed acute osteomyelitis of lateral malleolus.  Orthopedics/ID consulted.  Currently on board spectrum antibiotics.  Orthopedics did excision of distal fibula with application of skin graft and application of wound VAC.  ID recommending oral antibiotics on discharge.  Discharge planning to home today. ? ?Following problems were addressed during his hospitalization: ? ?Sepsis: Presented with hypotension, tachycardia.  Blood cultures have been sent, no growth till date. Blood pressure remains stable now on  midodrine.  ?Has leukocytosis, check CBC in a week ?ID consulted and following.  Recommended Augmentin and doxycycline for 2 -3 weeks duration ?  ?Osteomyelitis of the left lateral malleolus: MRI showed deep soft tissue ulceration at the lateral ankle with ulcer base extending to the underlying lateral malleolus. Acute osteomyelitis of the lateral malleolus. Small tibiotalar joint effusion with thin synovial enhancement,concerning for septic arthritis.Enhancing  tenosynovitis involving the peroneal tendons .  Status post excision of distal fibula, application of skin graft, application of wound VAC.  He will be discharged on wound VAC and follow-up with Dr. Sharol Given in a week ?  ?Hidradenitis suppurativa of perianal, right buttock, bilateral groin: General surgery following.  Underwent incision and drainage of right thigh abscess on 3/24, aspirate sent for culture, showing gram-positive cocci in pairs.  General surgery recommending outpatient dermatology follow-up.Also on clindamycin topical gel ?  ?Suspected pneumonia: Chest x-ray showed homogeneous opacity in the medial right upper lung fields suggesting atelectasis/pneumonia in the right upper lobe. There is no pleural effusion.  Currently on room air.  Continue current antibiotics ?  ?Microcytic anemia/iron deficiency anemia: Hemoglobin was in the range of 6 on presentation.  Transfused with a unit of PRBC. Iron studies showed low iron, given a dose of IV iron.  Hemoglobin again dropped to the range of 6, transfused a unit of PRBC.  Total of 2 units during this hospitalization.  Check CBC in a week ?  ?Hypokalemia/hypomagnesemia: Supplemented with potassium and magnesium ?  ?Debility/T4 paraplegia: Complicated by fecal, urinary incontinence.  Nonambulatory.  Uses wheelchair.  Followed with Dr. Dema Severin for consideration of diverting colostomy he says he declined colostomy.  PT/OT did not recommend any follow-up ?  ?Severe protein calorie malnutrition: Dietitian were  following ?  ? ? ? ? ?Discharge Diagnoses:  ?Principal Problem: ?  Sepsis ([place]) ?Active Problems: ?  Paraplegia ([place]) ?  Neurogenic bladder ?  Ankle wound, left, initial encounter ?  Hypokalemia ?  Microcytic anemia ?  Protein-calorie malnutrition, severe ([place]) ?  Pressure injury of skin ?  Subacute osteomyelitis, left ankle and foot ([place]) ?  Leukocytosis ?  Abscess of right thigh ?  Septic arthritis of  right foot ([place]) ? ? ? ?Discharge Instructions ? ?Discharge  Instructions   ? ? Diet general   Complete by: As directed ?  ? Discharge instructions   Complete by: As directed ?  ? 1)Please take prescribed medications as instructed ?2)Follow up with Dr. Sharol Given in 1 week ?3)Follow up with infectious disease ,Dr. West Bali on  April 18th @ 2:30 pm ?4)Follow up with your PCP in a week.  Do a CBC test during the follow-up  ? Discharge wound care:   Complete by: As directed ?  ? - wash daily with soap and water, cover with dry dressing.  ? Increase activity slowly   Complete by: As directed ?  ? Negative Pressure Wound Therapy - Incisional   Complete by: As directed ?  ? Attach the wound VAC dressing to the portable Praveena wound VAC pump for discharge.  Plug-in the portable Praveena pump to make sure it is charged.  ? ?  ? ?Allergies as of 06/05/2021   ?No Known Allergies ?  ? ?  ?Medication List  ?  ? ?TAKE these medications   ? ?acetaminophen 325 MG tablet ?Commonly known as: TYLENOL ?Take 325-650 mg by mouth every 6 (six) hours as needed for mild pain or headache. ?  ?AMBULATORY NON FORMULARY MEDICATION ?Physiological scientist. ?  ?amoxicillin-clavulanate 875-125 MG tablet ?Commonly known as: Augmentin ?Take 1 tablet by mouth 2 (two) times daily for 21 days. ?  ?clindamycin 1 % gel ?Commonly known as: CLINDAGEL ?Apply topically 2 (two) times daily. ?  ?doxycycline 100 MG tablet ?Commonly known as: VIBRA-TABS ?Take 1 tablet (100 mg total) by mouth 2 (two) times daily for 21 days. ?  ?ferrous sulfate 325 (65 FE) MG tablet ?Take 1 tablet (325 mg total) by mouth daily. ?  ?finasteride 5 MG tablet ?Commonly known as: PROSCAR ?Take 5 mg by mouth daily. ?  ?midodrine 5 MG tablet ?Commonly known as: PROAMATINE ?Take 1 tablet (5 mg total) by mouth 3 (three) times daily with meals. ?  ?ROGAINE MENS EXTRA STRENGTH EX ?Apply 1 application. topically in the morning and at bedtime. ?  ?Scientist, research (medical) ?Use as directed with wheelchair. ?  ? ?  ? ?  ?  ? ? ?  ?Discharge Care  Instructions  ?(From admission, onward)  ?  ? ? ?  ? ?  Start     Ordered  ? 06/05/21 0000  Discharge wound care:       ?Comments: - wash daily with soap and water, cover with dry dressing.  ? 06/05/21 1036  ? ?  ?  ? ?  ? ? Follow-up Information   ? ? Newt Minion, MD Follow up in 1 week(s).   ?Specialty: Orthopedic Surgery ?Contact information: ?[address] ?[place] [address] ?[phone] ? ? ?  ?  ? ? Rosiland Oz, MD Follow up on 06/27/2021.   ?Specialty: Infectious Diseases ?Why: Hospital Discharge Follow Up appointment 2:30 pm ?Contact information: ?[place] ?[address] ?[place] [address] ?[phone] ? ? ?  ?  ? ?  ?  ? ?  ? ?No Known Allergies ? ?Consultations: ?ID, orthopedics, general surgery ? ? ?Procedures/Studies: ?DG Ankle 2 Views Left ? ?Result Date: 06/02/2021 ?CLINICAL DATA:  Osteotomy, lateral malleolus osteomyelitis. EXAM: LEFT ANKLE - 2 VIEW COMPARISON:  None. FINDINGS: Fluoroscopic images were obtained intraoperatively and submitted for post operative interpretation. Fluoroscopic images demonstrate resection of the distal fibula, 2 images were obtained  with 3 seconds of fluoroscopy time and 0.04 mGy. Please see the performing provider's procedural report for further detail. IMPRESSION: As above. Electronically Signed   By: Yetta Glassman M.D.   On: 06/02/2021 14:57  ? ?MR FOOT LEFT W WO CONTRAST ? ?Result Date: 05/29/2021 ?CLINICAL DATA:  Osteomyelitis, foot wound; Osteomyelitis suspected, ankle, no prior imaging possible osteo EXAM: MRI OF THE LEFT FOREFOOT WITHOUT AND WITH CONTRAST; MRI OF THE LEFT ANKLE WITHOUT AND WITH CONTRAST TECHNIQUE: Multiplanar, multisequence MR imaging of the left ankle and left forefoot was performed both before and after administration of intravenous contrast. CONTRAST:  93m GADAVIST GADOBUTROL 1 MMOL/ML IV SOLN COMPARISON:  None. FINDINGS: Bones/Joint/Cartilage Bone marrow edema throughout the lateral malleolus and visualized distal fibula  with intermediate T1 marrow signal compatible with acute osteomyelitis. Small tibiotalar joint effusion with thin synovial enhancement, concerning for septic arthritis. No bone marrow edema or signal abnormality

## 2021-06-05 NOTE — Progress Notes (Signed)
[facility] ?Progress Note ? ?3 Days Post-Op  ?Subjective: ?CC:  ?No new complaints. Wants to go home. ? ?Objective: ?Vital signs in last 24 hours: ?Temp:  [97.9 ?F (36.6 ?C)-98.9 ?F (37.2 ?C)] 98.1 ?F (36.7 ?C) (03/27 2993) ?Pulse Rate:  [70-107] 70 (03/27 0748) ?Resp:  [18-19] 19 (03/27 0748) ?BP: (90-113)/(49-71) 90/49 (03/27 0748) ?SpO2:  [98 %-100 %] 98 % (03/27 0748) ?Last BM Date : 06/04/21 ? ?Intake/Output from previous day: ?03/26 0701 - 03/27 0700 ?In: 1488.1 [P.O.:790; Blood:431.7; IV Piggyback:266.4] ?Out: 25 [Drains:25] ?Intake/Output this shift: ?No intake/output data recorded. ? ?PE: ?Gen:  Alert, NAD, pleasant ?Card:  Regular rate and rhythm ?Abd: Soft, non-tender, non-distended ?Skin: warm and dry, no rashes  ?R posterior thigh -- there is a 1.0 x 0.5 cm incision draining SS fluid. Undermines 2-3 cm circumferentially but is not tracking or tunneling to communicate with any other collections. There is some firm inflammatory changes and nodularities that track medially towards the perineum that appear chronic. ?Psych: A&Ox3  ? ?Lab Results:  ?Recent Labs  ?  06/04/21 ?7169 06/04/21 ?1821 06/05/21 ?0217  ?WBC 17.6*  --  18.6*  ?HGB 6.8* 8.9* 8.5*  ?HCT 24.4* 30.8* 29.2*  ?PLT 394  --  376  ? ?BMET ?Recent Labs  ?  06/03/21 ?0510 06/04/21 ?0449  ?NA 138 140  ?K 4.1 4.0  ?CL 108 110  ?CO2 25 25  ?GLUCOSE 174* 96  ?BUN 9 13  ?CREATININE 0.35* <0.30*  ?CALCIUM 7.8* 7.6*  ? ?PT/INR ?No results for input(s): LABPROT, INR in the last 72 hours. ?CMP  ?   ?Component Value Date/Time  ? NA 140 06/04/2021 0449  ? K 4.0 06/04/2021 0449  ? CL 110 06/04/2021 0449  ? CO2 25 06/04/2021 0449  ? GLUCOSE 96 06/04/2021 0449  ? BUN 13 06/04/2021 0449  ? CREATININE <0.30 (L) 06/04/2021 0449  ? CREATININE 0.45 (L) 12/24/2014 1618  ? CALCIUM 7.6 (L) 06/04/2021 0449  ? PROT 5.5 (L) 05/31/2021 0641  ? ALBUMIN <1.5 (L) 05/31/2021 0641  ? AST 9 (L) 05/31/2021 0641  ? ALT 8 05/31/2021 0641  ? ALKPHOS 53 05/31/2021 0641   ? BILITOT 0.7 05/31/2021 0641  ? GFRNONAA NOT CALCULATED 06/04/2021 0449  ? ?Lipase  ?No results found for: LIPASE ? ? ? ? ?Studies/Results: ?No results found. ? ?Anti-infectives: ?Anti-infectives (From admission, onward)  ? ? Start     Dose/Rate Route Frequency Ordered Stop  ? 06/02/21 1100  metroNIDAZOLE (FLAGYL) tablet 500 mg       ? 500 mg Oral Every 12 hours 06/02/21 1006    ? 06/02/21 0600  ceFAZolin (ANCEF) IVPB 2g/100 mL premix       ? 2 g ?200 mL/hr over 30 Minutes Intravenous On call to O.R. 06/01/21 2215 06/02/21 1412  ? 06/01/21 2000  DAPTOmycin (CUBICIN) 450 mg in sodium chloride 0.9 % IVPB       ? 8 mg/kg ? 54.4 kg ?118 mL/hr over 30 Minutes Intravenous Daily 06/01/21 0919    ? 05/31/21 2200  cefTRIAXone (ROCEPHIN) 2 g in sodium chloride 0.9 % 100 mL IVPB       ? 2 g ?200 mL/hr over 30 Minutes Intravenous Every 24 hours 05/31/21 1503    ? 05/30/21 0800  vancomycin (VANCOREADY) IVPB 750 mg/150 mL  Status:  Discontinued       ? 750 mg ?150 mL/hr over 60 Minutes Intravenous Every 12 hours 05/29/21 2001 06/01/21 0919  ? 05/30/21 0600  metroNIDAZOLE (FLAGYL) IVPB 500 mg  Status:  Discontinued       ? 500 mg ?100 mL/hr over 60 Minutes Intravenous Every 12 hours 05/30/21 0027 05/31/21 1503  ? 05/29/21 2300  ceFEPIme (MAXIPIME) 2 g in sodium chloride 0.9 % 100 mL IVPB  Status:  Discontinued       ? 2 g ?200 mL/hr over 30 Minutes Intravenous Every 8 hours 05/29/21 1754 05/31/21 1503  ? 05/29/21 2200  ceFEPIme (MAXIPIME) 2 g in sodium chloride 0.9 % 100 mL IVPB  Status:  Discontinued       ? 2 g ?200 mL/hr over 30 Minutes Intravenous Every 8 hours 05/29/21 1746 05/29/21 1754  ? 05/29/21 1500  ceFEPIme (MAXIPIME) 2 g in sodium chloride 0.9 % 100 mL IVPB       ? 2 g ?200 mL/hr over 30 Minutes Intravenous  Once 05/29/21 1456 05/29/21 1544  ? 05/29/21 1500  metroNIDAZOLE (FLAGYL) IVPB 500 mg       ? 500 mg ?100 mL/hr over 60 Minutes Intravenous  Once 05/29/21 1456 05/29/21 1922  ? 05/29/21 1500  vancomycin  (VANCOCIN) IVPB 1000 mg/200 mL premix       ? 1,000 mg ?200 mL/hr over 60 Minutes Intravenous  Once 05/29/21 1456 05/29/21 2059  ? ?  ? ? ? ?Assessment/Plan ? Hidradenitis suppurativa of perianal, R buttock and bilateral groin ?- largely stable - did develop abscess of R posterior thigh as described below but no other fluctuant areas that would benefit from acute drainage ?- recommend sitz baths as able or warm compresses followed by wound care ?- recommend abx - IV or PO x 6 weeks are typical standard for this along with topical clindamycin ?- would recommend outpatient dermatology follow up ?- no indication for urgent/emergent general surgery intervention  ?  ?Right posterior thigh abscess, superficial ?- s/p bedside I&D Friday 3/24 ?- Cx - GPC in pairs ?- wash daily with soap and water, cover with dry dressing. ?- stable. Does not need any additional drainage. General surgery will sign off. Abx per ID. Call as needed.  ? ?ID: [email or web address], rocephin, daptomycin, flagyl; per ID.  ?  ?- below per TRH -  ?Sepsis - possibly from L ankle as well as hidradenitis and right posterior thigh abscess, temperature curve improved, WBC 22. Blood cxs negative, sent wound cx today ?L lateral ankle wound with osteomyelitis and possible septic arthritis - per ortho, planning OR today ?Paraplegia with neurogenic bladder  ?Severe protein calorie malnutrition  ?Microcytic anemia   ? LOS: 7 days  ? ? ?Obie Dredge, PA-C ?[facility] ?Please see Amion for pager number during day hours 7:00am-4:30pm ? ? ? ? ? ? ?

## 2021-06-05 NOTE — Progress Notes (Signed)
Occupational Therapy Treatment ?Patient Details ?Name: Lee Reilly ?MRN: [id number] ?DOB: November 03, 1985 ?Today's Date: 06/05/2021 ? ? ?History of present illness Pt is a 36 y/o male who presents 05/29/2021 with worsening of wound on his left leg. Patient was admitted for the suspicion of sepsis, he was hypotensive and tachycardic on presentation.  Imaging of the LLE showed acute osteomyelitis of lateral malleolus.  Orthopedics/ID consulted and pt now s/p excision of distal fibula with application of skin graft and application of wound VAC on 06/02/2021. 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 ?  ?OT comments ? Pt making progress towards goals this session, able to complete UB dressing with set up A, min A for LB dressing to manage wound vac tubing. Pt  supervision for A > P transfer to w/c, educated pt on safety precautions including locking brakes, not leaving gap between transfer surfaces. Pt presenting with impairments listed below, will follow acutely to maximize safety and independence with ADLs/functional mobility.  ? ?Recommendations for follow up therapy are one component of a multi-disciplinary discharge planning process, led by the attending physician.  Recommendations may be updated based on patient status, additional functional criteria and insurance authorization. ?   ?Follow Up Recommendations ? No OT follow up  ?  ?Assistance Recommended at Discharge Set up Supervision/Assistance  ?Patient can return home with the following ? A little help with bathing/dressing/bathroom;Assist for transportation;A little help with walking and/or transfers ?  ?Equipment Recommendations ? None recommended by OT;Other (comment) (pt has all necessary DME)  ?  ?Recommendations for Other Services   ? ?  ?Precautions / Restrictions Precautions ?Precautions: Fall ?Precaution Comments:  Wound VAC LLE ?Restrictions ?Weight Bearing Restrictions: Yes ?RLE Weight Bearing: Non weight bearing ?LLE Weight Bearing: Non weight bearing  ? ? ?  ? ?Mobility Bed Mobility ?Overal bed mobility: Modified Independent ?  ?  ?  ?  ?  ?  ?General bed mobility comments: able to roll L/R for pad adjustment, and move EOB with increased time ?  ? ?Transfers ?Overall transfer level: Modified independent ?Equipment used: None ?  ?  ?  ?  ?  ?  ?  ?General transfer comment: performed anterior posterior transfer to w/c from bed ?  ?  ?Balance Overall balance assessment: Mild deficits observed, not formally tested ?  ?  ?  ?  ?  ?  ?  ?  ?  ?  ?  ?  ?  ?  ?  ?  ?  ?  ?   ? ?ADL either performed or assessed with clinical judgement  ? ?ADL Overall ADL's : Needs assistance/impaired ?  ?  ?  ?  ?  ?  ?  ?  ?Upper Body Dressing : Set up;Sitting ?Upper Body Dressing Details (indicate cue type and reason): dons sweatshirt ?Lower Body Dressing: Minimal assistance ?Lower Body Dressing Details (indicate cue type and reason): to don pants, managing wound vac ?Toilet Transfer: Supervision/safety ?Toilet Transfer Details (indicate cue type and reason): A <>P or lateral scoot ?Toileting- Clothing Manipulation and Hygiene: Maximal assistance;Sitting/lateral lean ?Toileting - Clothing Manipulation Details (indicate cue type and reason): rolling for brief change, bandage change, and pad change ?  ?  ?Functional mobility during ADLs: Supervision/safety ?  ?  ? ?Extremity/Trunk Assessment Upper Extremity Assessment ?Upper Extremity Assessment: Overall WFL for tasks assessed ?  ?Lower Extremity Assessment ?  Lower Extremity Assessment: Defer to PT evaluation ?LLE Deficits / Details: Wound VAC in place. ?  ?  ?  ? ?Vision   ?Vision Assessment?: No apparent visual deficits ?Additional Comments: wears glasses at baseline ?  ?Perception Perception ?Perception: Not tested ?  ?Praxis Praxis ?Praxis: Not tested ?  ? ?Cognition Arousal/Alertness:  Awake/alert ?Behavior During Therapy: [facility] for tasks assessed/performed ?Overall Cognitive Status: Within Functional Limits for tasks assessed ?  ?  ?  ?  ?  ?  ?  ?  ?  ?  ?  ?  ?  ?  ?  ?  ?  ?  ?  ?   ?Exercises   ? ?  ?Shoulder Instructions   ? ? ?  ?General Comments pt's parent in room intermittently during session  ? ? ?Pertinent Vitals/ Pain       Pain Assessment ?Pain Assessment: No/denies pain ? ?Home Living   ?  ?  ?  ?  ?  ?  ?  ?  ?  ?  ?  ?  ?  ?  ?  ?  ?  ?  ? ?  ?Prior Functioning/Environment    ?  ?  ?  ?   ? ?Frequency ? Min 2X/week  ? ? ? ? ?  ?Progress Toward Goals ? ?OT Goals(current goals can now be found in the care plan section) ? Progress towards OT goals: Progressing toward goals ? ?Acute Rehab OT Goals ?Patient Stated Goal: to go home ?OT Goal Formulation: With patient ?Time For Goal Achievement: 06/17/21 ?Potential to Achieve Goals: Good ?ADL Goals ?Pt Will Perform Upper Body Dressing: Independently;sitting ?Pt Will Perform Lower Body Dressing: Independently;sitting/lateral leans ?Pt Will Transfer to Toilet: Independently;anterior/posterior transfer;with transfer board;bedside commode;regular height toilet ?Pt Will Perform Tub/Shower Transfer: anterior/posterior transfer;tub bench;with transfer board;with supervision  ?Plan Discharge plan remains appropriate;Frequency remains appropriate   ? ?Co-evaluation ? ? ?   ?  ?  ?  ?  ? ?  ?AM-PAC OT "6 Clicks" Daily Activity     ?Outcome Measure ? ? Help from another person eating meals?: None ?Help from another person taking care of personal grooming?: None ?Help from another person toileting, which includes using toliet, bedpan, or urinal?: A Little ?Help from another person bathing (including washing, rinsing, drying)?: A Little ?Help from another person to put on and taking off regular upper body clothing?: A Little ?Help from another person to put on and taking off regular lower body clothing?: A Little ?6 Click Score: 20 ? ?  ?End of Session    ? ?OT Visit Diagnosis: Unsteadiness on feet (R26.81);Other abnormalities of gait and mobility (R26.89);Muscle weakness (generalized) (M62.81) ?  ?Activity Tolerance Patient tolerated treatment well ?  ?Patient Left with call bell/phone within reach;in chair (in w/c, with PT) ?  ?Nurse Communication Mobility status ?  ? ?   ? ?Time: 8325-4982 ?OT Time Calculation (min): 22 min ? ?Charges: OT General Charges ?$OT Visit: 1 Visit ?OT Treatments ?$Self Care/Home Management : 8-22 mins ? ?Lynnda Child, OTD, OTR/L ?Acute Rehab ?[phone] ? ? ?Kaylyn Lim ?06/05/2021, 12:26 PM ?

## 2021-06-05 NOTE — Progress Notes (Signed)
Brief ID Notes:  ? ?Lee Reilly is a 36 y.o. male now post op day 3 following excisional debridement osteomyelitis of the left lateral ankle.  ?GPC on stain, but no growth on cultures thus far. Tissue margins were clear of infection.  ? ?Plan for 2-3 week course of augmentin + doxycycline PO BID.  ? ?FU has been made with Dr. West Bali April 18th @ 2:30 pm  ? ?ID will sign off. Please call with any questions or changes in the patient's condition.  ? ? ?Janene Madeira, MSN, NP-C ?[facility] ?[facility]  ?[email or web address] ?Pager: [phone] ?Office: [phone] ?[facility] Main Line: [phone] ?*Secure Chat Communication Welcome  ?

## 2021-06-05 NOTE — Progress Notes (Signed)
Physical Therapy Treatment ?Patient Details ?Name: Lee Reilly ?MRN: [id number] ?DOB: June 23, 1985 ?Today's Date: 06/05/2021 ? ? ?History of Present Illness Pt is a 36 y/o male who presents 05/29/2021 with worsening of wound on his left leg. Patient was admitted for the suspicion of sepsis, he was hypotensive and tachycardic on presentation.  Imaging of the LLE showed acute osteomyelitis of lateral malleolus.  Orthopedics/ID consulted and pt now s/p excision of distal fibula with application of skin graft and application of wound VAC on 06/02/2021. 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 ? ?  ?PT Comments  ? ? Pt progressing well with mobility and was able to demonstrate transfers at a mod I level from bed>wheelchair. Pt with good understanding of precautions but required intermittent cues to initiate the anterior posterior transfer instead of sitting EOB first. Pt able to quickly change his plan and execute the transfer well. PT will sign off at this time. If needs change, please reconsult.   ?Recommendations for follow up therapy are one component of a multi-disciplinary discharge planning process, led by the attending physician.  Recommendations may be updated based on patient status, additional functional criteria and insurance authorization. ? ?Follow Up Recommendations ? Other (comment) (Pt requesting a home health aide.) ?  ?  ?Assistance Recommended at Discharge PRN  ?Patient can return home with the following A little help with bathing/dressing/bathroom;Assistance with cooking/housework;Assist for transportation ?  ?Equipment Recommendations ? None recommended by PT  ?  ?Recommendations for Other Services   ? ? ?  ?Precautions / Restrictions Precautions ?Precautions: Fall ?Precaution Comments: Wound VAC LLE ?Restrictions ?Weight Bearing Restrictions: Yes ?RLE Weight  Bearing: Non weight bearing ?LLE Weight Bearing: Non weight bearing  ?  ? ?Mobility ? Bed Mobility ?Overal bed mobility: Modified Independent ?  ?  ?  ?  ?  ?  ?General bed mobility comments: able to roll L/R for pad adjustment, and move EOB with increased time ?  ? ?Transfers ?Overall transfer level: Modified independent ?Equipment used: None ?  ?  ?  ?  ?  ?  ?  ?General transfer comment: performed anterior posterior transfer to w/c from bed ?  ? ?Ambulation/Gait ?  ?  ?  ?  ?  ?  ?  ?General Gait Details: Pt nonambulatory at baseline. ? ? ?Stairs ?  ?  ?  ?  ?  ? ? ?Wheelchair Mobility ?  ? ?Modified Rankin (Stroke Patients Only) ?  ? ? ?  ?Balance Overall balance assessment: Mild deficits observed, not formally tested ?  ?  ?  ?  ?  ?  ?  ?  ?  ?  ?  ?  ?  ?  ?  ?  ?  ?  ?  ? ?  ?Cognition Arousal/Alertness: Awake/alert ?Behavior During Therapy: [facility] for tasks assessed/performed ?Overall Cognitive Status: Within Functional Limits for tasks assessed ?  ?  ?  ?  ?  ?  ?  ?  ?  ?  ?  ?  ?  ?  ?  ?  ?  ?  ?  ? ?  ?Exercises   ? ?  ?General Comments General comments (skin integrity, edema, etc.): pt's parent in room intermittently during session ?  ?  ? ?Pertinent Vitals/Pain Pain Assessment ?Pain Assessment: No/denies pain  ? ? ?Home Living   ?  ?  ?  ?  ?  ?  ?  ?  ?  ?   ?  ?  Prior Function    ?  ?  ?   ? ?PT Goals (current goals can now be found in the care plan section) Acute Rehab PT Goals ?Patient Stated Goal: Home today ?PT Goal Formulation: With patient/family ?Time For Goal Achievement: 06/10/21 ?Potential to Achieve Goals: Good ?Progress towards PT goals: Progressing toward goals ? ?  ?Frequency ? ? ? Min 3X/week ? ? ? ?  ?PT Plan Current plan remains appropriate  ? ? ?Co-evaluation   ?  ?  ?  ?  ? ?  ?AM-PAC PT "6 Clicks" Mobility   ?Outcome Measure ? Help needed turning from your back to your side while in a flat bed without using bedrails?: None ?Help needed moving from lying on your back to sitting on  the side of a flat bed without using bedrails?: None ?Help needed moving to and from a bed to a chair (including a wheelchair)?: None ?Help needed standing up from a chair using your arms (e.g., wheelchair or bedside chair)?: Total ?Help needed to walk in hospital room?: Total ?Help needed climbing 3-5 steps with a railing? : Total ?6 Click Score: 15 ? ?  ?End of Session   ?Activity Tolerance: Patient tolerated treatment well ?Patient left: in chair;with call bell/phone within reach ?Nurse Communication: Mobility status ?PT Visit Diagnosis: Other abnormalities of gait and mobility (R26.89) ?  ? ? ?Time: 5329-9242 ?PT Time Calculation (min) (ACUTE ONLY): 14 min ? ?Charges:  $Therapeutic Activity: 8-22 mins          ?          ? ?Rolinda Roan, PT, DPT ?Acute Rehabilitation Services ?Pager: [phone] ?Office: [phone]  ? ? ?Thelma Comp ?06/05/2021, 3:38 PM ? ?

## 2021-06-05 NOTE — Progress Notes (Signed)
Patient ID: Lee Reilly, male   DOB: 1985-12-10, 36 y.o.   MRN: [id number] ?Patient is postoperative day 3 excisional debridement osteomyelitis and ulceration lateral left ankle.  Cultures are showing gram-positive cocci.  There is 100 cc in the wound VAC canister.  Anticipate discharge to home on oral antibiotics for 3 weeks pending culture sensitivities.  Tissue margins were clear. ?

## 2021-06-05 NOTE — TOC Transition Note (Signed)
Transition of Care (TOC) - CM/SW Discharge Note ? ? ?Patient Details  ?Name: Lee Reilly ?MRN: [id number] ?Date of Birth: [birth year] ? ?Transition of Care (TOC) CM/SW Contact:  ?Bartholomew Crews, RN ?Phone Number: [phone] ?06/05/2021, 9:59 AM ? ? ?Clinical Narrative:    ? ?Spoke with patient at the bedside. Mom was present. Discussed post acute transition. No therapy needs. Patient has transportation home. No TOC needs identified.  ? ?Final next level of care: Home/Self Care ?Barriers to Discharge: No Barriers Identified ? ? ?Patient Goals and CMS Choice ?Patient states their goals for this hospitalization and ongoing recovery are:: return home ?CMS Medicare.gov Compare Post Acute Care list provided to:: Patient ?Choice offered to / list presented to : NA ? ?Discharge Placement ?  ?           ?  ?  ?  ?  ? ?Discharge Plan and Services ?  ?  ?           ?DME Arranged: N/A ?DME Agency: NA ?  ?  ?  ?HH Arranged: NA ?[place] Agency: NA ?  ?  ?  ? ?Social Determinants of Health (SDOH) Interventions ?  ? ? ?Readmission Risk Interventions ?   ? View : No data to display.  ?  ?  ?  ? ? ? ? ? ?

## 2021-06-06 ENCOUNTER — Encounter (HOSPITAL_COMMUNITY): Payer: Self-pay | Admitting: Orthopedic Surgery

## 2021-06-07 ENCOUNTER — Encounter (HOSPITAL_COMMUNITY): Payer: Self-pay | Admitting: Orthopedic Surgery

## 2021-06-07 LAB — AEROBIC/ANAEROBIC CULTURE W GRAM STAIN (SURGICAL/DEEP WOUND): Culture: NO GROWTH

## 2021-06-10 DIAGNOSIS — Z419 Encounter for procedure for purposes other than remedying health state, unspecified: Secondary | ICD-10-CM | POA: Diagnosis not present

## 2021-06-12 ENCOUNTER — Encounter: Payer: Self-pay | Admitting: Orthopedic Surgery

## 2021-06-12 ENCOUNTER — Ambulatory Visit (INDEPENDENT_AMBULATORY_CARE_PROVIDER_SITE_OTHER): Payer: 59 | Admitting: Orthopedic Surgery

## 2021-06-12 DIAGNOSIS — M86172 Other acute osteomyelitis, left ankle and foot: Secondary | ICD-10-CM

## 2021-06-12 DIAGNOSIS — L89622 Pressure ulcer of left heel, stage 2: Secondary | ICD-10-CM
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
## 2021-06-13 ENCOUNTER — Encounter: Payer: 59 | Admitting: Orthopedic Surgery

## 2021-06-19 ENCOUNTER — Encounter: Payer: Self-pay | Admitting: Orthopedic Surgery

## 2021-06-19 ENCOUNTER — Ambulatory Visit (INDEPENDENT_AMBULATORY_CARE_PROVIDER_SITE_OTHER): Payer: 59 | Admitting: Orthopedic Surgery

## 2021-06-19 DIAGNOSIS — M86172 Other acute osteomyelitis, left ankle and foot: Secondary | ICD-10-CM

## 2021-06-19 DIAGNOSIS — L89622 Pressure ulcer of left heel, stage 2: Secondary | ICD-10-CM
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
## 2021-06-26 ENCOUNTER — Encounter: Payer: Self-pay | Admitting: Orthopedic Surgery

## 2021-06-26 ENCOUNTER — Ambulatory Visit (INDEPENDENT_AMBULATORY_CARE_PROVIDER_SITE_OTHER): Payer: 59 | Admitting: Orthopedic Surgery

## 2021-06-26 DIAGNOSIS — M86172 Other acute osteomyelitis, left ankle and foot: Secondary | ICD-10-CM
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
## 2021-06-27 ENCOUNTER — Ambulatory Visit (INDEPENDENT_AMBULATORY_CARE_PROVIDER_SITE_OTHER): Payer: 59 | Admitting: Infectious Diseases

## 2021-06-27 ENCOUNTER — Other Ambulatory Visit: Payer: Self-pay

## 2021-06-27 ENCOUNTER — Encounter: Payer: Self-pay | Admitting: Infectious Diseases

## 2021-06-27 VITALS — BP 98/63 | HR 117 | Temp 97.4°F

## 2021-06-27 DIAGNOSIS — L02415 Cutaneous abscess of right lower limb: Secondary | ICD-10-CM | POA: Diagnosis not present

## 2021-06-27 DIAGNOSIS — S91002D Unspecified open wound, left ankle, subsequent encounter: Secondary | ICD-10-CM | POA: Diagnosis not present

## 2021-06-27 DIAGNOSIS — Z5181 Encounter for therapeutic drug level monitoring: Secondary | ICD-10-CM | POA: Diagnosis not present

## 2021-06-27 NOTE — Progress Notes (Signed)
? ?  ? ?Patient Active Problem List  ? Diagnosis Date Noted  ? Abscess of right thigh   ? Septic arthritis of right foot ([place])   ? Subacute osteomyelitis, left ankle and foot ([place])   ? Leukocytosis   ? Pressure injury of skin 05/30/2021  ? Sepsis ([place]) 05/29/2021  ? Ankle wound, left, initial encounter 05/29/2021  ? Hypokalemia 05/29/2021  ? Microcytic anemia 05/29/2021  ? Protein-calorie malnutrition, severe ([place]) 05/29/2021  ? PCP NOTES >>>>>>>>>>>>>>>>>>>>>>>>>>>> 06/13/2015  ? Annual physical exam 05/01/2013  ? Acne 05/01/2013  ? Substance abuse ([place]) 09/05/2011  ? 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 Paraplegia ([place]) 04/08/2007  ? Asthma 04/08/2007  ? Neurogenic bladder 04/08/2007  ? HIDRADENITIS SUPPURATIVA 04/08/2007  ? ?Current Outpatient Medications on File Prior to Visit  ?Medication Sig Dispense Refill  ? acetaminophen (TYLENOL) 325 MG tablet Take 325-650 mg by mouth every 6 (six) hours as needed for mild pain or headache.    ? AMBULATORY NON FORMULARY MEDICATION Extra Management consultant for Wheelchair. 1 Device 0  ? clindamycin (CLINDAGEL) 1 % gel Apply topically 2 (two) times daily. 30 g 2  ? ferrous sulfate 325 (65 FE) MG tablet Take 1 tablet (325 mg total) by mouth daily. 30 tablet 3  ? midodrine (PROAMATINE) 5 MG tablet Take 1 tablet (5 mg total) by mouth 3 (three) times daily with meals. 90 tablet 0  ? Minoxidil (ROGAINE MENS EXTRA STRENGTH EX) Apply 1 application. topically in the morning and at bedtime.    ? Misc. Devices [facility] CUSHION) MISC Use as directed with wheelchair. 1 each 0  ? ?No current facility-administered medications on file prior to visit.  ? ?Subjective: ?Here for HFU for rt posterior thigh abscess and left ankle wound. He was discharged from the hospital on 3/27 and has been closely following with Dr Sharol Given. Patient also has developed decubitus left heel ulcer on fu visit with Dr Sharol Given 4/3 and was placed in [facility]. Last seen by Dr Sharol Given on 4/17 and left ankle wound has been thought to be  healing well with no concerns for infection. He has completed course of doxycycline and augmentin as prescribed yesterday. His mother has been changing dressing of the left ankle. Minimal serosanguinous drainage if any from the left ankle. Denies any issues with the HS lesions in his rt posterior thigh, groin and perineal area. Denies any drainage that he can notice. Denies any fevers, chills and sweats. Denies nausea, vomiting and diarrhea. He has no concerns today. He is aware about following up with Dermatology for HS ?  ?Review of Systems: ?ROS all systems reviewed with pertinent positives and negatives as listed above ? ?Past Medical History:  ?Diagnosis Date  ? Acne 05/01/2013  ? Asthma   ? Neuroblastoma [facility])   ? @ 7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   ? Neurogenic bladder   ? has no control, occasional cath  ? Paraplegia ([place])   ? Tumor T4 @ age 84month had surgery  ? ? ?Social History  ? ?Tobacco Use  ? Smoking status: Former  ? Smokeless tobacco: Never  ? Tobacco comments:  ?  << 1 ppd   ?Vaping Use  ? Vaping Use: Never used  ?Substance Use Topics  ? Alcohol use: Yes  ?  Alcohol/week: 2.0 standard drinks  ?  Types: 2 Standard drinks or equivalent per week  ?  Comment: socially   ? Drug use: Yes  ?  Types: Marijuana  ?  Comment: Smokes Marihuana, denies others  ? ? ?  Family History  ?Problem Relation Age of Onset  ? Diabetes Father   ? Colon cancer Neg Hx   ? Prostate cancer Neg Hx   ? CAD Neg Hx   ? ? ?No Known Allergies ? ?Health Maintenance  ?Topic Date Due  ? COVID-19 Vaccine (1) Never done  ? Hepatitis C Screening  Never done  ? INFLUENZA VACCINE  10/10/2021  ? TETANUS/TDAP  10/10/2026  ? HIV Screening  Completed  ? HPV VACCINES  Aged Out  ? ? ?Objective: ?BP 98/63   Pulse (!) 117   Temp (!) 97.4 ?F (36.3 ?C) (Oral)   SpO2 100%  ? ? ?Physical Exam ?Constitutional:   ?   Appearance: Normal appearance.  ?HENT:  ?   Head: Normocephalic and atraumatic.   ?   Mouth: Mucous membranes are moist.  ?Eyes: ?   Conjunctiva/sclera:  Conjunctivae normal.  ?   Pupils: Pupils are equal, round ? ?Cardiovascular:  ?   Rate and Rhythm: Normal rate ?   Heart sounds:  ? ?Pulmonary:  ?   Effort: Pulmonary effort is normal.  ?   Breath sounds: ? ?Abdominal:  ?   General: Non distended  ?   Palpations: soft.  ? ?Musculoskeletal:     ?   General: paraplegia + ? ? ? ? ? ? ? ?Skin: ?   General: Skin is warm and dry.  ?   Comments: ? ?Neurological:  ?   General:  ?   Mental Status: awake, alert and oriented to person, place, and time.  ? ?Psychiatric:     ?   Mood and Affect: Mood normal.  ? ?Lab Results ?Lab Results  ?Component Value Date  ? WBC 18.6 (H) 06/05/2021  ? HGB 8.5 (L) 06/05/2021  ? HCT 29.2 (L) 06/05/2021  ? MCV 76.8 (L) 06/05/2021  ? PLT 376 06/05/2021  ?  ?Lab Results  ?Component Value Date  ? CREATININE <0.30 (L) 06/04/2021  ? BUN 13 06/04/2021  ? NA 140 06/04/2021  ? K 4.0 06/04/2021  ? CL 110 06/04/2021  ? CO2 25 06/04/2021  ?  ?Lab Results  ?Component Value Date  ? ALT 8 05/31/2021  ? AST 9 (L) 05/31/2021  ? ALKPHOS 53 05/31/2021  ? BILITOT 0.7 05/31/2021  ?  ?Lab Results  ?Component Value Date  ? CHOL 147 12/24/2014  ? HDL 39 (L) 12/24/2014  ? [place] 92 12/24/2014  ? TRIG 81 12/24/2014  ? CHOLHDL 3.8 12/24/2014  ? ?No results found for: LABRPR, RPRTITER ?No results found for: HIV1RNAQUANT, HIV1RNAVL, CD4TABS ?  ?Problem List Items Addressed This Visit   ? ?  ? Other  ? Abscess of right thigh - Primary  ? Ankle wound, left, subsequent encounter  ? Medication monitoring encounter  ? ?Assessment/Plan  ?# Left Lateral ankle chronic wound ?-S/p left distal tibia excision and pin placement for ankle stabilization, application  of kerecis biologic tissue graft and wound vac application 7/09.  ?-Discussed with Dr Sharol Given regarding OR on 3/24 with no concerns for remaining osteomyelitis as well as no signs of septic tibial talar joint. ?-Completed 3+ weeks of IV and PO abtx on 05/26/21 ?  ?# RT posterior thigh abscess ( 8 cm * 4cm) in the setting of  Hidradenitis suppurativa( healed) ?- s/p I and D 3/24. Wound cx with GPC in pairs in gram stain, no growth in cultures( final) ?- Completed 3 + weeks of IV and PO abtx on 4/17 ?- Discussed to fu  with Dermatology ? ?# Medication Monitoring ?- will not do labs as dc'ing abtx ? ?# H/o Neuroblastoma with T4 paraplegia  ?# Neurogenic bladder/Fecal Incontinence  ? ?I have personally spent 41 minutes involved in face-to-face and non-face-to-face activities for this patient on the day of the visit including staff time, coordination of care and counseling of the patient.  ? ?Wilber Oliphant, MD ?[facility] ?[facility] ?06/27/2021, 2:19 PM ? ?

## 2021-06-30 ENCOUNTER — Ambulatory Visit: Payer: 59

## 2021-06-30 ENCOUNTER — Telehealth: Payer: Self-pay | Admitting: Orthopedic Surgery

## 2021-06-30 DIAGNOSIS — M86172 Other acute osteomyelitis, left ankle and foot: Secondary | ICD-10-CM

## 2021-06-30 MED ORDER — AMOXICILLIN-POT CLAVULANATE 875-125 MG PO TABS: 1.0000 | ORAL_TABLET | Freq: Two times a day (BID) | ORAL | 0 refills | Status: DC

## 2021-06-30 NOTE — Telephone Encounter (Signed)
I called pt and she states that there is a small open area and that he would like to have someone look at the inciison today. Will come in now and will update Dr. Sharol Given. Made an appt for him also to come in and see Dr. Sharol Given Monday also  ?

## 2021-06-30 NOTE — Progress Notes (Signed)
06/02/21 excision left distal fibula wound closure w/ pin placement to subtalar ankle  ?  ? Pt states that while he was changing his dressing today he though he noticed a change in appearance of his incision. In today for eval. The incision has opened compared to ho he looked at last visit. There is depth to the wound and bloody drainage. His foot is very swlooen.  Photos were obtained and I discussed with Dr. Sharol Given. Advised that the pt had completed his ABX from infectious disease last week of Augmentin 875 bid. This was refilled for the pt to take for 21 days per Dr. Sharol Given. Silvadene ointment sample was provided and per Dr. Sharol Given this was packed into incision and wrapped with ace bandage. The pt's PRAFO was reapplied. Advised the pt to wash the area with dial soap and water daily. Pack the wound with thin layer of silvadene on gauze and wrap with ace. To elevate foot higher than his heart and will follow up in the office May 1st.  ? ?Mariana Arn, [place], CWCA ?

## 2021-06-30 NOTE — Telephone Encounter (Signed)
Patient called. Says the wound looks open again. Would like to come in today and have someone to look at it. [phone] ?

## 2021-07-03 ENCOUNTER — Encounter (HOSPITAL_COMMUNITY): Payer: Self-pay | Admitting: Orthopedic Surgery

## 2021-07-03 ENCOUNTER — Encounter: Payer: 59 | Admitting: Orthopedic Surgery

## 2021-07-10 ENCOUNTER — Ambulatory Visit (INDEPENDENT_AMBULATORY_CARE_PROVIDER_SITE_OTHER): Payer: 59 | Admitting: Orthopedic Surgery

## 2021-07-10 ENCOUNTER — Encounter: Payer: Self-pay | Admitting: Orthopedic Surgery

## 2021-07-10 ENCOUNTER — Encounter: Payer: 59 | Admitting: Orthopedic Surgery

## 2021-07-10 DIAGNOSIS — M86172 Other acute osteomyelitis, left ankle and foot: Secondary | ICD-10-CM

## 2021-07-10 DIAGNOSIS — Z419 Encounter for procedure for purposes other than remedying health state, unspecified: Secondary | ICD-10-CM | POA: Diagnosis not present
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
## 2021-07-17 ENCOUNTER — Encounter: Payer: Self-pay | Admitting: Orthopedic Surgery

## 2021-07-17 ENCOUNTER — Ambulatory Visit (INDEPENDENT_AMBULATORY_CARE_PROVIDER_SITE_OTHER): Payer: 59 | Admitting: Orthopedic Surgery

## 2021-07-17 DIAGNOSIS — L89622 Pressure ulcer of left heel, stage 2: Secondary | ICD-10-CM

## 2021-07-17 DIAGNOSIS — M86172 Other acute osteomyelitis, left ankle and foot: Secondary | ICD-10-CM
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
## 2021-07-25 ENCOUNTER — Encounter: Payer: Self-pay | Admitting: Orthopedic Surgery

## 2021-07-25 ENCOUNTER — Ambulatory Visit (INDEPENDENT_AMBULATORY_CARE_PROVIDER_SITE_OTHER): Payer: 59 | Admitting: Orthopedic Surgery

## 2021-07-25 DIAGNOSIS — M86172 Other acute osteomyelitis, left ankle and foot: Secondary | ICD-10-CM
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
## 2021-08-10 ENCOUNTER — Telehealth: Payer: Self-pay

## 2021-08-10 DIAGNOSIS — Z419 Encounter for procedure for purposes other than remedying health state, unspecified: Secondary | ICD-10-CM | POA: Diagnosis not present

## 2021-08-10 NOTE — Telephone Encounter (Signed)
Dr. Sharol Given is aware and had discussed this with pt at last visit. He does have follow up on Tuesday 08/15/2021 pt confirmed that he was aware this may happen everything looks fine he does not wish to move up appt at this time.

## 2021-08-10 NOTE — Telephone Encounter (Signed)
Patient called and LVM on triage line. States "Metal rod" came out yesterday and wanted to let Dr. Sharol Given know. States foot looks fine. Would like a CB.    CB [phone]

## 2021-08-15 ENCOUNTER — Ambulatory Visit (INDEPENDENT_AMBULATORY_CARE_PROVIDER_SITE_OTHER): Payer: 59 | Admitting: Orthopedic Surgery

## 2021-08-15 DIAGNOSIS — M86172 Other acute osteomyelitis, left ankle and foot: Secondary | ICD-10-CM

## 2021-08-22 ENCOUNTER — Ambulatory Visit (INDEPENDENT_AMBULATORY_CARE_PROVIDER_SITE_OTHER): Payer: 59 | Admitting: Orthopedic Surgery

## 2021-08-22 DIAGNOSIS — M86172 Other acute osteomyelitis, left ankle and foot: Secondary | ICD-10-CM

## 2021-08-25 ENCOUNTER — Encounter: Payer: Self-pay | Admitting: Orthopedic Surgery

## 2021-08-25 NOTE — Progress Notes (Signed)
Office Visit Note   Patient: Lee Reilly           Date of Birth: 09/24/1985           MRN: [id number] Visit Date: 08/22/2021              Requested by: [facility], [address] [place],  [place] [address] PCP: [facility]  Chief Complaint  Patient presents with   Left Ankle - Routine Post Op    06/02/21 excision left distal fib wound closure and pin placement 08/15/21 in office kerecis powder application     Wound heal and consider tissue graft at follow-up.  ing will continue with dressing changes HPI: Patient is a 36 year old gentleman who is status post excision left distal fibula closure placement of a pin to stabilize the ankle.  Patient most recently obtained Kerecis powder graft on June 6.  Assessment & Plan: Visit Diagnoses:  1. Osteomyelitis of ankle, left, acute ([place])     Plan: Patient is showing excellent  Follow-Up Instructions: Return in about 2 weeks (around 09/05/2021).   Ortho Exam  Patient is alert, oriented, no adenopathy, well-dressed, normal affect, normal respiratory effort. Examination patient has improved granulation tissue in the wound bed.  There is less depth there is improved epithelialization.  Imaging: No results found.   Labs: Lab Results  Component Value Date   ESRSEDRATE 59 (H) 06/01/2021   CRP 12.0 (H) 06/01/2021   REPTSTATUS 06/07/2021 FINAL 06/02/2021   GRAMSTAIN  06/02/2021    ABUNDANT WBC PRESENT, PREDOMINANTLY PMN RARE GRAM POSITIVE COCCI IN PAIRS    CULT  06/02/2021    No growth aerobically or anaerobically. Performed at [facility], [place] [address] [place], [place] [address]      Lab Results  Component Value Date   ALBUMIN <1.5 (L) 05/31/2021   ALBUMIN 1.7 (L) 05/30/2021   ALBUMIN 1.9 (L) 05/29/2021    Lab Results  Component Value Date   MG 1.9 06/02/2021   MG 1.5 (L) 05/31/2021   MG 1.9 05/29/2021   No results found  for: "VD25OH"  No results found for: "PREALBUMIN"    Latest Ref Rng & Units 06/05/2021    2:17 AM 06/04/2021    6:21 PM 06/04/2021    4:49 AM  CBC EXTENDED  WBC 4.0 - 10.5 K/uL 18.6   17.6   RBC 4.22 - 5.81 MIL/uL 3.80   3.12   Hemoglobin 13.0 - 17.0 g/dL 8.5  8.9  6.8   HCT 39.0 - 52.0 % 29.2  30.8  24.4   Platelets 150 - 400 K/uL 376   394      There is no height or weight on file to calculate BMI.  Orders:  No orders of the defined types were placed in this encounter.  No orders of the defined types were placed in this encounter.    Procedures: No procedures performed  Clinical Data: No additional findings.  ROS:  All other systems negative, except as noted in the HPI. Review of Systems  Objective: Vital Signs: There were no vitals taken for this visit.  Specialty Comments:  No specialty comments available.  PMFS History: Patient Active Problem List   Diagnosis Date Noted   Ankle wound, left, subsequent encounter 06/27/2021   Medication monitoring encounter 06/27/2021   Abscess of right thigh    Septic arthritis of right foot ([place])    Subacute osteomyelitis,  left ankle and foot ([place])    Leukocytosis    Pressure injury of skin 05/30/2021   Hypokalemia 05/29/2021   Microcytic anemia 05/29/2021   Protein-calorie malnutrition, severe ([place]) 05/29/2021   PCP NOTES >>>>>>>>>>>>>>>>>>>>>>>>>>>> 06/13/2015   Annual physical exam 05/01/2013   Acne 05/01/2013   Substance abuse ([place]) 09/05/2011   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   Paraplegia ([place]) 04/08/2007   Asthma 04/08/2007   Neurogenic bladder 04/08/2007   HIDRADENITIS SUPPURATIVA 04/08/2007   Past Medical History:  Diagnosis Date   Acne 05/01/2013   Asthma    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    Neurogenic bladder    has no control, occasional cath   Paraplegia [facility])    Tumor T4 @ age 97month had surgery    Family History  Problem Relation Age of Onset   Diabetes Father    Colon cancer Neg Hx     Prostate cancer Neg Hx    CAD Neg Hx     Past Surgical History:  Pro[facility] Laterality Date   [facility]  [place] [facility]   [place] [facility]  I & D EXTREMITY Left 06/02/2021   Procedure: EXCISION LEFT DISTAL FIBULA WOUND CLOSURE, PIN PLACEMENT TO STABILIZE ANKLE;  Surgeon: DNewt Minion MD;  [facility]  Service: Orthopedics;  Laterality: Left;   SPINAL FUSION  1993   SPINE SURGERY     740monthand 6 years   Social History   Occupational History   Occupation: disabled  Tobacco Use   Smoking status: Former   Smokeless tobacco: Never   Tobacco comments:    << 1 ppd   Vaping Use   Vaping Use: Never used  Substance and Sexual Activity   Alcohol use: Yes    Alcohol/week: 2.0 standard drinks of alcohol    Types: 2 Standard drinks or equivalent per week    Comment: socially    Drug use: Yes    Types: Marijuana    Comment: Smokes Marihuana, denies others   Sexual activity: Never

## 2021-08-29 ENCOUNTER — Encounter: Payer: Self-pay | Admitting: Orthopedic Surgery

## 2021-08-29 NOTE — Progress Notes (Signed)
Office Visit Note   Patient: Lee Reilly           Date of Birth: 11/08/1985           MRN: [id number] Visit Date: 08/15/2021              Requested by: [facility], [address] [place],  [place] [address] PCP: [facility]  Chief Complaint  Patient presents with   Left Ankle - Routine Post Op    06/02/21 EXCISION LEFT DISTAL FIBULA WOUND CLOSURE, PIN PLACEMENT TO STABILIZE ANKLE      HPI: Patient is a 36 year old gentleman who is seen in follow-up for excision left distal fibula for osteomyelitis.  Initial surgery 06/02/2021.  Patient currently using Silvadene dressing changes.  The intramedullary rod to stabilize the ankle fell out on 08/10/2021.  Assessment & Plan: Visit Diagnoses:  1. Osteomyelitis of ankle, left, acute (HCC)     Plan: Kerecis micro powder was applied covered with Adaptic and a dry dressing start dressing changes in 3 days.  Follow-Up Instructions: Return in about 1 week (around 08/22/2021).   Ortho Exam  Patient is alert, oriented, no adenopathy, well-dressed, normal affect, normal respiratory effort. Examination the wound bed continues to improve the wound was debrided back to healthy granulation tissue there is no exposed bone or tendon.  Kerecis powder was applied followed by Adaptic and a dry dressing.  The wound was 4 x 6 cm and 1 cm deep prior to application of the Kerecis powder.  Imaging: No results found.     Labs: Lab Results  Component Value Date   ESRSEDRATE 59 (H) 06/01/2021   CRP 12.0 (H) 06/01/2021   REPTSTATUS 06/07/2021 FINAL 06/02/2021   GRAMSTAIN  06/02/2021    ABUNDANT WBC PRESENT, PREDOMINANTLY PMN RARE GRAM POSITIVE COCCI IN PAIRS    CULT  06/02/2021    No growth aerobically or anaerobically. Performed at [facility], [place] [address], [place], [place] [address]      Lab Results  Component Value Date   ALBUMIN <1.5 (L) 05/31/2021    ALBUMIN 1.7 (L) 05/30/2021   ALBUMIN 1.9 (L) 05/29/2021    Lab Results  Component Value Date   MG 1.9 06/02/2021   MG 1.5 (L) 05/31/2021   MG 1.9 05/29/2021   No results found for: "VD25OH"  No results found for: "PREALBUMIN"    Latest Ref Rng & Units 06/05/2021    2:17 AM 06/04/2021    6:21 PM 06/04/2021    4:49 AM  CBC EXTENDED  WBC 4.0 - 10.5 K/uL 18.6   17.6   RBC 4.22 - 5.81 MIL/uL 3.80   3.12   Hemoglobin 13.0 - 17.0 g/dL 8.5  8.9  6.8   HCT 39.0 - 52.0 % 29.2  30.8  24.4   Platelets 150 - 400 K/uL 376   394      There is no height or weight on file to calculate BMI.  Orders:  No orders of the defined types were placed in this encounter.  No orders of the defined types were placed in this encounter.    Procedures: No procedures performed  Clinical Data: No additional findings.  ROS:  All other systems negative, except as noted in the HPI. Review of Systems  Objective: Vital Signs: There were no vitals taken for this visit.  Specialty Comments:  No specialty comments available.  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   Paraplegia ([place]) 04/08/2007   Asthma 04/08/2007   Neurogenic bladder 04/08/2007   HIDRADENITIS SUPPURATIVA 04/08/2007   Past Medical History:  Diagnosis Date   Acne 05/01/2013   Asthma    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    Neurogenic bladder    has  no control, occasional cath   Paraplegia [facility])    Tumor T4 @ age 44month had surgery    Family History  Problem Relation Age of Onset   Diabetes Father    Colon cancer Neg Hx    Prostate cancer Neg Hx    CAD Neg Hx     Past Surgical History:  Pro[facility] Laterality Date   [facility]  [place] [facility]   [place] [facility]  I & D EXTREMITY Left 06/02/2021   Procedure: EXCISION LEFT DISTAL FIBULA WOUND CLOSURE, PIN PLACEMENT TO STABILIZE ANKLE;  Surgeon: DNewt Minion MD;  [facility]  Service: Orthopedics;  Laterality: Left;   SPINAL FUSION  1993   SPINE SURGERY     787monthand 8 years   Social History   Occupational History   Occupation: disabled  Tobacco Use   Smoking status: Former   Smokeless tobacco: Never   Tobacco comments:    << 1 ppd   Vaping Use   Vaping Use: Never used  Substance and Sexual Activity   Alcohol use: Yes    Alcohol/week: 2.0 standard drinks of alcohol    Types: 2 Standard drinks or equivalent per week    Comment: socially    Drug use: Yes    Types: Marijuana    Comment: Smokes Marihuana, denies others   Sexual activity: Never

## 2021-09-05 ENCOUNTER — Ambulatory Visit (INDEPENDENT_AMBULATORY_CARE_PROVIDER_SITE_OTHER): Payer: 59 | Admitting: Orthopedic Surgery

## 2021-09-05 DIAGNOSIS — L89622 Pressure ulcer of left heel, stage 2: Secondary | ICD-10-CM | POA: Diagnosis not present

## 2021-09-05 DIAGNOSIS — M86172 Other acute osteomyelitis, left ankle and foot: Secondary | ICD-10-CM | POA: Diagnosis not present

## 2021-09-09 DIAGNOSIS — Z419 Encounter for procedure for purposes other than remedying health state, unspecified: Secondary | ICD-10-CM | POA: Diagnosis not present

## 2021-09-11 ENCOUNTER — Encounter: Payer: Self-pay | Admitting: Orthopedic Surgery

## 2021-09-11 NOTE — Progress Notes (Signed)
Office Visit Note   Patient: Lee Reilly           Date of Birth: 1986/01/21           MRN: [id number] Visit Date: 09/05/2021              Requested by: [facility], [address] [place],  [place] [address] PCP: [facility]  Chief Complaint  Patient presents with   Left Ankle - Follow-up    06/02/21 excision left distal fib with wound closure      HPI: Patient is a 36 year old gentleman who is seen in follow-up 2 weeks status post application of an office Kerecis micro powder.  Assessment & Plan: Visit Diagnoses:  1. Osteomyelitis of ankle, left, acute ([place])   2. Pressure injury of left heel, stage 2 ([place])     Plan: Continue with routine wound care  Follow-Up Instructions: Return in about 4 weeks (around 10/03/2021).   Ortho Exam  Patient is alert, oriented, no adenopathy, well-dressed, normal affect, normal respiratory effort. Examination patient has shown excellent improvement since application of micro powder.  The deep wound has filled then the wound is almost completely healed there is no cellulitis no odor no drainage no signs of infection.  Imaging: No results found.   Labs: Lab Results  Component Value Date   ESRSEDRATE 59 (H) 06/01/2021   CRP 12.0 (H) 06/01/2021   REPTSTATUS 06/07/2021 FINAL 06/02/2021   GRAMSTAIN  06/02/2021    ABUNDANT WBC PRESENT, PREDOMINANTLY PMN RARE GRAM POSITIVE COCCI IN PAIRS    CULT  06/02/2021    No growth aerobically or anaerobically. Performed at [facility], [place] [address] [place], [place] [address]      Lab Results  Component Value Date   ALBUMIN <1.5 (L) 05/31/2021   ALBUMIN 1.7 (L) 05/30/2021   ALBUMIN 1.9 (L) 05/29/2021    Lab Results  Component Value Date   MG 1.9 06/02/2021   MG 1.5 (L) 05/31/2021   MG 1.9 05/29/2021   No results found for: "VD25OH"  No results found for: "PREALBUMIN"    Latest Ref Rng &  Units 06/05/2021    2:17 AM 06/04/2021    6:21 PM 06/04/2021    4:49 AM  CBC EXTENDED  WBC 4.0 - 10.5 K/uL 18.6   17.6   RBC 4.22 - 5.81 MIL/uL 3.80   3.12   Hemoglobin 13.0 - 17.0 g/dL 8.5  8.9  6.8   HCT 39.0 - 52.0 % 29.2  30.8  24.4   Platelets 150 - 400 K/uL 376   394      There is no height or weight on file to calculate BMI.  Orders:  No orders of the defined types were placed in this encounter.  No orders of the defined types were placed in this encounter.    Procedures: No procedures performed  Clinical Data: No additional findings.  ROS:  All other systems negative, except as noted in the HPI. Review of Systems  Objective: Vital Signs: There were no vitals taken for this visit.  Specialty Comments:  No specialty comments available.  PMFS History: Patient Active Problem List   Diagnosis Date Noted   Ankle wound, left, subsequent encounter 06/27/2021   Medication monitoring encounter 06/27/2021   Abscess of right thigh    Septic arthritis of right foot ([place])    Subacute osteomyelitis, left ankle and foot ([place])  Leukocytosis    Pressure injury of skin 05/30/2021   Hypokalemia 05/29/2021   Microcytic anemia 05/29/2021   Protein-calorie malnutrition, severe ([place]) 05/29/2021   PCP NOTES >>>>>>>>>>>>>>>>>>>>>>>>>>>> 06/13/2015   Annual physical exam 05/01/2013   Acne 05/01/2013   Substance abuse ([place]) 09/05/2011   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   Paraplegia ([place]) 04/08/2007   Asthma 04/08/2007   Neurogenic bladder 04/08/2007   HIDRADENITIS SUPPURATIVA 04/08/2007   Past Medical History:  Diagnosis Date   Acne 05/01/2013   Asthma    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    Neurogenic bladder    has no control, occasional cath   Paraplegia [facility])    Tumor T4 @ age 48month had surgery    Family History  Problem Relation Age of Onset   Diabetes Father    Colon cancer Neg Hx    Prostate cancer Neg Hx    CAD Neg Hx     Past Surgical History:   Procedure Laterality Date   [facility]  [place]   [place] [facility]  I & D EXTREMITY Left 06/02/2021   Procedure: EXCISION LEFT DISTAL FIBULA WOUND CLOSURE, PIN PLACEMENT TO STABILIZE ANKLE;  Surgeon: DNewt Minion MD;  [facility]  Service: Orthopedics;  Laterality: Left;   SPINAL FUSION  1993   SPINE SURGERY     745monthand 8 years   Social History   Occupational History   Occupation: disabled  Tobacco Use   Smoking status: Former   Smokeless tobacco: Never   Tobacco comments:    << 1 ppd   Vaping Use   Vaping Use: Never used  Substance and Sexual Activity   Alcohol use: Yes    Alcohol/week: 2.0 standard drinks of alcohol    Types: 2 Standard drinks or equivalent per week    Comment: socially    Drug use: Yes    Types: Marijuana    Comment: Smokes Marihuana, denies others   Sexual activity: Never

## 2021-10-03 ENCOUNTER — Ambulatory Visit (INDEPENDENT_AMBULATORY_CARE_PROVIDER_SITE_OTHER): Payer: 59 | Admitting: Orthopedic Surgery

## 2021-10-03 DIAGNOSIS — M86172 Other acute osteomyelitis, left ankle and foot: Secondary | ICD-10-CM

## 2021-10-04 ENCOUNTER — Encounter: Payer: Self-pay | Admitting: Orthopedic Surgery

## 2021-10-04 NOTE — Progress Notes (Signed)
Office Visit Note   Patient: Lee Reilly           Date of Birth: [birth year]           MRN: [id number] Visit Date: 10/03/2021              Requested by: [facility], [address] [place],  [place] [address] PCP: [facility]  Chief Complaint  Patient presents with   Left Ankle - Follow-up    06/02/21 excision of left distal fib w/ wound closure      HPI: Patient is a 36 year old gentleman status post excision of osteomyelitis left distal fibula.  Patient had wound dehiscence and was most recently treated with Kerecis micro powder in the office on 08/22/2021.  Assessment & Plan: Visit Diagnoses:  1. Osteomyelitis of ankle, left, acute ([place])     Plan: The wound is completely epithelialized there is no drainage no cellulitis.  Patient was given a size small Vive wear compression sock.  Follow-Up Instructions: Return if symptoms worsen or fail to improve.   Ortho Exam  Patient is alert, oriented, no adenopathy, well-dressed, normal affect, normal respiratory effort. Examination patient's wound has completely healed and epithelialized after using the Kerecis micro powder.  Imaging: No results found.   Labs: Lab Results  Component Value Date   ESRSEDRATE 59 (H) 06/01/2021   CRP 12.0 (H) 06/01/2021   REPTSTATUS 06/07/2021 FINAL 06/02/2021   GRAMSTAIN  06/02/2021    ABUNDANT WBC PRESENT, PREDOMINANTLY PMN RARE GRAM POSITIVE COCCI IN PAIRS    CULT  06/02/2021    No growth aerobically or anaerobically. Performed at [facility], [place] [address] [place], [place] [address]      Lab Results  Component Value Date   ALBUMIN <1.5 (L) 05/31/2021   ALBUMIN 1.7 (L) 05/30/2021   ALBUMIN 1.9 (L) 05/29/2021    Lab Results  Component Value Date   MG 1.9 06/02/2021   MG 1.5 (L) 05/31/2021   MG 1.9 05/29/2021   No results found for: "VD25OH"  No results found for: "PREALBUMIN"     Latest Ref Rng & Units 06/05/2021    2:17 AM 06/04/2021    6:21 PM 06/04/2021    4:49 AM  CBC EXTENDED  WBC 4.0 - 10.5 K/uL 18.6   17.6   RBC 4.22 - 5.81 MIL/uL 3.80   3.12   Hemoglobin 13.0 - 17.0 g/dL 8.5  8.9  6.8   HCT 39.0 - 52.0 % 29.2  30.8  24.4   Platelets 150 - 400 K/uL 376   394      There is no height or weight on file to calculate BMI.  Orders:  No orders of the defined types were placed in this encounter.  No orders of the defined types were placed in this encounter.    Procedures: No procedures performed  Clinical Data: No additional findings.  ROS:  All other systems negative, except as noted in the HPI. Review of Systems  Objective: Vital Signs: There were no vitals taken for this visit.  Specialty Comments:  No specialty comments available.  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   Paraplegia ([place]) 04/08/2007   Asthma 04/08/2007   Neurogenic bladder 04/08/2007   HIDRADENITIS SUPPURATIVA 04/08/2007   Past Medical History:  Diagnosis Date   Acne 05/01/2013   Asthma    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    Neurogenic bladder    has no control, occasional cath   Paraplegia [facility])    Tumor T4 @ age 8month had surgery    Family History  Problem Relation Age of Onset   Diabetes Father    Colon cancer Neg Hx    Prostate cancer Neg Hx    CAD Neg Hx     Past Surgical  History:  [facility] Laterality Date   [facility]  [facility]   [place] [facility]  I & D EXTREMITY Left 06/02/2021   Procedure: EXCISION LEFT DISTAL FIBULA WOUND CLOSURE, PIN PLACEMENT TO STABILIZE ANKLE;  Surgeon: DNewt Minion MD;  [facility]  Service: Orthopedics;  Laterality: Left;   SPINAL FUSION  1993   SPINE SURGERY     753monthand 8 years   Social History   Occupational History   Occupation: disabled  Tobacco Use   Smoking status: Former   Smokeless tobacco: Never   Tobacco comments:    << 1 ppd   Vaping Use   Vaping Use: Never used  Substance and Sexual Activity   Alcohol use: Yes    Alcohol/week: 2.0 standard drinks of alcohol    Types: 2 Standard drinks or equivalent per week    Comment: socially    Drug use: Yes    Types: Marijuana    Comment: Smokes Marihuana, denies others   Sexual activity: Never

## 2021-10-09 ENCOUNTER — Telehealth: Payer: Self-pay | Admitting: Orthopedic Surgery

## 2021-10-09 NOTE — Telephone Encounter (Signed)
IC,spoke with Zola Button. Advised patients handicap parking application is ready to be picked up.

## 2021-10-10 DIAGNOSIS — Z419 Encounter for procedure for purposes other than remedying health state, unspecified: Secondary | ICD-10-CM | POA: Diagnosis not present

## 2021-10-12 ENCOUNTER — Telehealth: Payer: Self-pay | Admitting: Orthopedic Surgery

## 2021-10-12 NOTE — Telephone Encounter (Signed)
Received call from [place] at front desk stating pts wife called requesting the handicap parking application for her husband be faxed to her. I faxed [phone].

## 2021-11-02 ENCOUNTER — Telehealth: Payer: Self-pay | Admitting: Orthopedic Surgery

## 2021-11-02 NOTE — Telephone Encounter (Signed)
Handicap parking application faxed to Zola Button [phone]

## 2021-11-07 ENCOUNTER — Telehealth: Payer: Self-pay | Admitting: Orthopedic Surgery

## 2021-11-07 NOTE — Telephone Encounter (Signed)
Patient friend came in to drop off handicap paperwork

## 2021-11-10 DIAGNOSIS — Z419 Encounter for procedure for purposes other than remedying health state, unspecified: Secondary | ICD-10-CM | POA: Diagnosis not present

## 2021-12-10 DIAGNOSIS — Z419 Encounter for procedure for purposes other than remedying health state, unspecified: Secondary | ICD-10-CM | POA: Diagnosis not present

## 2021-12-10 DIAGNOSIS — R32 Unspecified urinary incontinence: Secondary | ICD-10-CM | POA: Diagnosis not present

## 2021-12-10 DIAGNOSIS — N319 Neuromuscular dysfunction of bladder, unspecified: Secondary | ICD-10-CM | POA: Diagnosis not present

## 2021-12-10 DIAGNOSIS — G8222 Paraplegia, incomplete: Secondary | ICD-10-CM | POA: Diagnosis not present

## 2021-12-14 DIAGNOSIS — G8220 Paraplegia, unspecified: Secondary | ICD-10-CM | POA: Diagnosis not present

## 2022-01-10 DIAGNOSIS — Z419 Encounter for procedure for purposes other than remedying health state, unspecified: Secondary | ICD-10-CM | POA: Diagnosis not present

## 2022-01-10 DIAGNOSIS — R32 Unspecified urinary incontinence: Secondary | ICD-10-CM | POA: Diagnosis not present

## 2022-01-10 DIAGNOSIS — N319 Neuromuscular dysfunction of bladder, unspecified: Secondary | ICD-10-CM | POA: Diagnosis not present

## 2022-01-10 DIAGNOSIS — G8222 Paraplegia, incomplete: Secondary | ICD-10-CM | POA: Diagnosis not present

## 2022-02-09 DIAGNOSIS — R32 Unspecified urinary incontinence: Secondary | ICD-10-CM | POA: Diagnosis not present

## 2022-02-09 DIAGNOSIS — N319 Neuromuscular dysfunction of bladder, unspecified: Secondary | ICD-10-CM | POA: Diagnosis not present

## 2022-02-09 DIAGNOSIS — G8222 Paraplegia, incomplete: Secondary | ICD-10-CM | POA: Diagnosis not present

## 2022-02-09 DIAGNOSIS — Z419 Encounter for procedure for purposes other than remedying health state, unspecified: Secondary | ICD-10-CM | POA: Diagnosis not present

## 2022-02-12 ENCOUNTER — Ambulatory Visit (INDEPENDENT_AMBULATORY_CARE_PROVIDER_SITE_OTHER): Payer: Medicaid Other | Admitting: Orthopedic Surgery

## 2022-02-12 DIAGNOSIS — S91002S Unspecified open wound, left ankle, sequela: Secondary | ICD-10-CM | POA: Diagnosis not present

## 2022-02-12 DIAGNOSIS — M86172 Other acute osteomyelitis, left ankle and foot: Secondary | ICD-10-CM

## 2022-02-13 ENCOUNTER — Encounter: Payer: Self-pay | Admitting: Orthopedic Surgery

## 2022-02-13 NOTE — Progress Notes (Signed)
Office Visit Note   Patient: Lee Reilly           Date of Birth: [birth year]           MRN: [id number] Visit Date: 02/12/2022              Requested by: [facility], [address] [place],  [place] [id number] PCP: [facility]  Chief Complaint  Patient presents with   Left Foot - Wound Check      HPI: Patient is a 36 year old gentleman who is seen in follow-up for excision left distal fibula secondary to osteomyelitis.  Patient ambulates in a wheelchair with his legs dependent with paralysis.  Patient states he has noticed some increased clear drainage.  Assessment & Plan: Visit Diagnoses:  1. Osteomyelitis of ankle, left, acute ([place])   2. Ankle wound, left, sequela     Plan: Discussed that the drainage is lymphatic fluid.  Discussed that our treatment options are elevation and compression with a graduated compression stocking.  Patient does not have any motor function in the lower extremity to mechanically pump the fluid up the leg.  This chronic drainage will most likely prevent the wound from healing further.  Follow-Up Instructions: Return in about 3 months (around 05/14/2022).   Ortho Exam  Patient is alert, oriented, no adenopathy, well-dressed, normal affect, normal respiratory effort. Examination patient has a wound over the lateral ankle that is 1 x 5 cm.  The wound bed has healthy granulation tissue it is flat there is no tunneling no cellulitis no signs of infection.  The drainage is clear appears lymphatic.  There is no fibrinous exudative tissue.  Imaging: No results found. No images are attached to the encounter.  Labs: Lab Results  Component Value Date   ESRSEDRATE 59 (H) 06/01/2021   CRP 12.0 (H) 06/01/2021   REPTSTATUS 06/07/2021 FINAL 06/02/2021   GRAMSTAIN  06/02/2021    ABUNDANT WBC PRESENT, PREDOMINANTLY PMN RARE GRAM POSITIVE COCCI IN PAIRS    CULT  06/02/2021    No  growth aerobically or anaerobically. Performed at [facility], [place] [address] [place], [place] [address]      Lab Results  Component Value Date   ALBUMIN <1.5 (L) 05/31/2021   ALBUMIN 1.7 (L) 05/30/2021   ALBUMIN 1.9 (L) 05/29/2021    Lab Results  Component Value Date   MG 1.9 06/02/2021   MG 1.5 (L) 05/31/2021   MG 1.9 05/29/2021   No results found for: "VD25OH"  No results found for: "PREALBUMIN"    Latest Ref Rng & Units 06/05/2021    2:17 AM 06/04/2021    6:21 PM 06/04/2021    4:49 AM  CBC EXTENDED  WBC 4.0 - 10.5 K/uL 18.6   17.6   RBC 4.22 - 5.81 MIL/uL 3.80   3.12   Hemoglobin 13.0 - 17.0 g/dL 8.5  8.9  6.8   HCT 39.0 - 52.0 % 29.2  30.8  24.4   Platelets 150 - 400 K/uL 376   394      There is no height or weight on file to calculate BMI.  Orders:  No orders of the defined types were placed in this encounter.  No orders of the defined types were placed in this encounter.    Procedures: No procedures performed  Clinical Data: No additional findings.  ROS:  All other systems negative, except as noted in the HPI. Review  of Systems  Objective: Vital Signs: There were no vitals taken for this visit.  Specialty Comments:  No specialty comments available.  PMFS History: Patient Active Problem List   Diagnosis Date Noted   Ankle wound, left, subsequent encounter 06/27/2021   Medication monitoring encounter 06/27/2021   Abscess of right thigh    Septic arthritis of right foot (HCC)    Subacute osteomyelitis, left ankle and foot (HCC)    Leukocytosis    Pressure injury of skin 05/30/2021   Hypokalemia 05/29/2021   Microcytic anemia 05/29/2021   Protein-calorie malnutrition, severe ([place]) 05/29/2021   PCP NOTES >>>>>>>>>>>>>>>>>>>>>>>>>>>> 06/13/2015   Annual physical exam 05/01/2013   Acne 05/01/2013   Substance abuse ([place]) 09/05/2011   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   Paraplegia ([place]) 04/08/2007   Asthma 04/08/2007   Neurogenic bladder  04/08/2007   HIDRADENITIS SUPPURATIVA 04/08/2007   Past Medical History:  Diagnosis Date   Acne 05/01/2013   Asthma    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    Neurogenic bladder    has no control, occasional cath   Paraplegia [facility])    Tumor T4 @ age 2month had surgery    Family History  Problem Relation Age of Onset   Diabetes Father    Colon cancer Neg Hx    Prostate cancer Neg Hx    CAD Neg Hx     Past Surgical History:  Procedure Laterality Date   [facility]  [place]   [place] [facility]  I & D EXTREMITY Left 06/02/2021   Procedure: EXCISION LEFT DISTAL FIBULA WOUND CLOSURE, PIN PLACEMENT TO STABILIZE ANKLE;  Surgeon: DNewt Minion MD;  [place]  Service: Orthopedics;  Laterality: Left;   SPINAL FUSION  1993   SPINE SURGERY     727monthand 8 years   Social History   Occupational History   Occupation: disabled  Tobacco Use   Smoking status: Former   Smokeless tobacco: Never   Tobacco comments:    << 1 ppd   Vaping Use   Vaping Use: Never used  Substance and Sexual Activity   Alcohol use: Yes    Alcohol/week: 2.0 standard drinks of alcohol    Types: 2 Standard drinks or equivalent per week    Comment: socially    Drug use: Yes    Types: Marijuana    Comment: Smokes Marihuana, denies others   Sexual activity: Never

## 2022-03-12 DIAGNOSIS — Z419 Encounter for procedure for purposes other than remedying health state, unspecified: Secondary | ICD-10-CM | POA: Diagnosis not present

## 2022-03-12 DIAGNOSIS — G8222 Paraplegia, incomplete: Secondary | ICD-10-CM | POA: Diagnosis not present

## 2022-03-12 DIAGNOSIS — N319 Neuromuscular dysfunction of bladder, unspecified: Secondary | ICD-10-CM | POA: Diagnosis not present

## 2022-03-12 DIAGNOSIS — R32 Unspecified urinary incontinence: Secondary | ICD-10-CM | POA: Diagnosis not present

## 2022-03-14 DIAGNOSIS — G8220 Paraplegia, unspecified: Secondary | ICD-10-CM | POA: Diagnosis not present

## 2022-03-29 ENCOUNTER — Telehealth: Payer: Self-pay | Admitting: Orthopedic Surgery

## 2022-03-29 NOTE — Telephone Encounter (Signed)
Received a fax yesterday. Was forwarded to Dr. Jess Barters team.

## 2022-03-29 NOTE — Telephone Encounter (Signed)
Holding and looking out for this.

## 2022-03-29 NOTE — Telephone Encounter (Signed)
[employer] called in stating they are sending a fax over for manual wheelchair please look out for that

## 2022-04-04 ENCOUNTER — Telehealth: Payer: Self-pay | Admitting: Orthopedic Surgery

## 2022-04-04 NOTE — Telephone Encounter (Signed)
Pt's mother [name]  called asking if a requesting that was faxed from the facility pt is staying in was filled out and faxed back for a wheelchair. Pt phone number is [phone].

## 2022-04-06 NOTE — Telephone Encounter (Signed)
I called and lm on vm to advise confirmed with medical records and there were two packets that came from [employer] both of them were completed and sent back last week. To call with any other questions.

## 2022-04-12 DIAGNOSIS — Z419 Encounter for procedure for purposes other than remedying health state, unspecified: Secondary | ICD-10-CM | POA: Diagnosis not present

## 2022-04-12 DIAGNOSIS — R32 Unspecified urinary incontinence: Secondary | ICD-10-CM | POA: Diagnosis not present

## 2022-04-26 ENCOUNTER — Ambulatory Visit (INDEPENDENT_AMBULATORY_CARE_PROVIDER_SITE_OTHER): Payer: No Typology Code available for payment source | Admitting: Orthopedic Surgery

## 2022-04-26 ENCOUNTER — Encounter: Payer: Self-pay | Admitting: Orthopedic Surgery

## 2022-04-26 ENCOUNTER — Telehealth: Payer: Self-pay | Admitting: Orthopedic Surgery

## 2022-04-26 DIAGNOSIS — S91002A Unspecified open wound, left ankle, initial encounter: Secondary | ICD-10-CM | POA: Diagnosis not present

## 2022-04-26 NOTE — Progress Notes (Signed)
Office Visit Note   Patient: Lee Reilly           Date of Birth: Jan 05, 1986           MRN: [id number] Visit Date: 04/26/2022              Requested by: [facility], [address] [place],  [place] [address] PCP: [facility]  Chief Complaint  Patient presents with   Left Ankle - Wound Check      HPI: Patient is a 37 year old gentleman who is status post excision of the left distal fibula for osteomyelitis.  Patient has noticed increased pain, odor, drainage and swelling recently with a larger ulcer.  Assessment & Plan: Visit Diagnoses:  1. Ankle wound, left, initial encounter     Plan: Will plan for repeat debridement of the left ankle with obtaining tissue cultures placement of Kerecis tissue graft and a wound VAC outpatient surgery.  Follow-Up Instructions: Return in about 2 weeks (around 05/10/2022).   Ortho Exam  Patient is alert, oriented, no adenopathy, well-dressed, normal affect, normal respiratory effort. Examination patient has a increasing sized ulcer over the lateral left ankle.  Wound is 3 x 5 cm and 1 cm deep.  There is central necrotic tissue in the wound bed.  The wound was completely healed in July.  Imaging: No results found.   Labs: Lab Results  Component Value Date   ESRSEDRATE 59 (H) 06/01/2021   CRP 12.0 (H) 06/01/2021   REPTSTATUS 06/07/2021 FINAL 06/02/2021   GRAMSTAIN  06/02/2021    ABUNDANT WBC PRESENT, PREDOMINANTLY PMN RARE GRAM POSITIVE COCCI IN PAIRS    CULT  06/02/2021    No growth aerobically or anaerobically. Performed at [facility], [place] [address], [place], [place] [address]      Lab Results  Component Value Date   ALBUMIN <1.5 (L) 05/31/2021   ALBUMIN 1.7 (L) 05/30/2021   ALBUMIN 1.9 (L) 05/29/2021    Lab Results  Component Value Date   MG 1.9 06/02/2021   MG 1.5 (L) 05/31/2021   MG 1.9 05/29/2021   No results found for:  "VD25OH"  No results found for: "PREALBUMIN"    Latest Ref Rng & Units 06/05/2021    2:17 AM 06/04/2021    6:21 PM 06/04/2021    4:49 AM  CBC EXTENDED  WBC 4.0 - 10.5 K/uL 18.6   17.6   RBC 4.22 - 5.81 MIL/uL 3.80   3.12   Hemoglobin 13.0 - 17.0 g/dL 8.5  8.9  6.8   HCT 39.0 - 52.0 % 29.2  30.8  24.4   Platelets 150 - 400 K/uL 376   394      There is no height or weight on file to calculate BMI.  Orders:  No orders of the defined types were placed in this encounter.  No orders of the defined types were placed in this encounter.    Procedures: No procedures performed  Clinical Data: No additional findings.  ROS:  All other systems negative, except as noted in the HPI. Review of Systems  Objective: Vital Signs: There were no vitals taken for this visit.  Specialty Comments:  No specialty comments available.  PMFS History: Patient Active Problem List   Diagnosis Date Noted   Ankle wound, left, subsequent encounter 06/27/2021   Medication monitoring encounter 06/27/2021   Abscess of right thigh    Septic arthritis of right foot (  [place])    Subacute osteomyelitis, left ankle and foot (HCC)    Leukocytosis    Pressure injury of skin 05/30/2021   Hypokalemia 05/29/2021   Microcytic anemia 05/29/2021   Protein-calorie malnutrition, severe ([place]) 05/29/2021   PCP NOTES >>>>>>>>>>>>>>>>>>>>>>>>>>>> 06/13/2015   Annual physical exam 05/01/2013   Acne 05/01/2013   Substance abuse ([place]) 09/05/2011   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   Paraplegia ([place]) 04/08/2007   Asthma 04/08/2007   Neurogenic bladder 04/08/2007   HIDRADENITIS SUPPURATIVA 04/08/2007   Past Medical History:  Diagnosis Date   Acne 05/01/2013   Asthma    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    Neurogenic bladder    has no control, occasional cath   Paraplegia [facility])    Tumor T4 @ age 15month had surgery    Family History  Problem Relation Age of Onset   Diabetes Father    Colon cancer Neg Hx     Prostate cancer Neg Hx    CAD Neg Hx     Past Surgical History:  Procedure Laterality Date   [facility]  [facility] Left 2015   [place] [facility]  I & D EXTREMITY Left 06/02/2021   Procedure: EXCISION LEFT DISTAL FIBULA WOUND CLOSURE, PIN PLACEMENT TO STABILIZE ANKLE;  Surgeon: DNewt Minion MD;  [place]  Service: Orthopedics;  Laterality: Left;   SPINAL FUSION  1993   SPINE SURGERY     736monthand 8 years   Social History   Occupational History   Occupation: disabled  Tobacco Use   Smoking status: Former   Smokeless tobacco: Never   Tobacco comments:    << 1 ppd   Vaping Use   Vaping Use: Never used  Substance and Sexual Activity   Alcohol use: Yes    Alcohol/week: 2.0 standard drinks of alcohol    Types: 2 Standard drinks or equivalent per week    Comment: socially    Drug use: Yes    Types: Marijuana    Comment: Smokes Marihuana, denies others   Sexual activity: Never

## 2022-04-26 NOTE — Telephone Encounter (Signed)
Patient would like a boot to protect his foot from hitting objects. [id number]

## 2022-04-27 ENCOUNTER — Telehealth: Payer: Self-pay | Admitting: Orthopedic Surgery

## 2022-04-27 NOTE — Telephone Encounter (Signed)
Lm on vm okay for short fracture boot for left ankle per Dr. Sharol Given. Asked for him to call me back so we can set up a time for him to come to office to pick it up.

## 2022-04-27 NOTE — Telephone Encounter (Signed)
Patient called. Says he will be by Monday to pick up the boot. 04/30/2022

## 2022-04-30 ENCOUNTER — Ambulatory Visit: Payer: No Typology Code available for payment source

## 2022-04-30 NOTE — Telephone Encounter (Signed)
Noted  

## 2022-04-30 NOTE — Telephone Encounter (Signed)
Pt called back and stated he will come by on Monday to pick up the boot. Will need to sign the DME tablet.

## 2022-05-02 ENCOUNTER — Other Ambulatory Visit: Payer: Self-pay

## 2022-05-02 ENCOUNTER — Encounter (HOSPITAL_COMMUNITY): Payer: Self-pay | Admitting: Orthopedic Surgery

## 2022-05-02 NOTE — Progress Notes (Addendum)
PCP - denies Cardiologist - denies  PPM/ICD - denies  Chest x-ray - 06/01/21 EKG - day of surgery - 05/04/22  CPAP - n/a  Fasting Blood Sugar - n/a  Blood Thinner Instructions: n/a Patient was instructed: As of today, STOP taking any Aspirin (unless otherwise instructed by your surgeon) Aleve, Naproxen, Ibuprofen, Motrin, Advil, Goody's, BC's, all herbal medications, fish oil, and all vitamins.  ERAS Protcol - yes, until 11:30 o'clock  COVID TEST- n/a  Anesthesia review: yes - due to history; also, hx of tachycardia (CE)  Patient verbally denies any shortness of breath, fever, cough and chest pain during phone call   -------------  SDW INSTRUCTIONS given:  Your procedure is scheduled on Friday, February 23rd, 2024.  Report to [facility] Main Entrance "A" at 12:10 A.M., and check in at the Admitting office.  Call this number if you have problems the morning of surgery:  [phone]   Remember:  Do not eat after midnight the night before your surgery  You may drink clear liquids until 11:30 the morning of your surgery.   Clear liquids allowed are: Water, Non-Citrus Juices (without pulp), Carbonated Beverages, Clear Tea, Black Coffee Only, and Gatorade    Take these medicines the morning of surgery with A SIP OF WATER: Mucinex - PRN   The day of surgery:                     Do not wear jewelry,             Do not wear lotions, powders, colognes, or deodorant.            Men may shave face and neck.            Do not bring valuables to the hospital.            [facility] is not responsible for any belongings or valuables.  Do NOT Smoke (Tobacco/Vaping) 24 hours prior to your procedure If you use a CPAP at night, you may bring all equipment for your overnight stay.   Contacts, glasses, dentures or bridgework may not be worn into surgery.      For patients admitted to the hospital, discharge time will be determined by your treatment team.   Patients discharged the  day of surgery will not be allowed to drive home, and someone needs to stay with them for 24 hours.    Special instructions:   [place]- Preparing For Surgery  Before surgery, you can play an important role. Because skin is not sterile, your skin needs to be as free of germs as possible. You can reduce the number of germs on your skin by washing with CHG (chlorahexidine gluconate) Soap before surgery.  CHG is an antiseptic cleaner which kills germs and bonds with the skin to continue killing germs even after washing.    Oral Hygiene is also important to reduce your risk of infection.  Remember - BRUSH YOUR TEETH THE MORNING OF SURGERY WITH YOUR REGULAR TOOTHPASTE  Please do not use if you have an allergy to CHG or antibacterial soaps. If your skin becomes reddened/irritated stop using the CHG.  Do not shave (including legs and underarms) for at least 48 hours prior to first CHG shower. It is OK to shave your face.  Please follow these instructions carefully.   Shower the NIGHT BEFORE SURGERY and the MORNING OF SURGERY with DIAL Soap.   Pat yourself dry with a CLEAN TOWEL.  Wear CLEAN PAJAMAS to bed the night before surgery  Place CLEAN SHEETS on your bed the night of your first shower and DO NOT SLEEP WITH PETS.   Day of Surgery: Please shower morning of surgery  Wear Clean/Comfortable clothing the morning of surgery Do not apply any deodorants/lotions.   Remember to brush your teeth WITH YOUR REGULAR TOOTHPASTE.   Questions were answered. Patient verbalized understanding of instructions.

## 2022-05-03 NOTE — Progress Notes (Signed)
Anesthesia Chart Review: Lee Reilly  Case: [id number] Date/Time: 05/04/22 1421   Procedure: DEBRIDEMENT LEFT ANKLE (Left)   Anesthesia type: Choice   Pre-op diagnosis: Ulcer Left Ankle   Location: [facility] OR ROOM 05 / [place] [facility]   Surgeons: Newt Minion, MD       DISCUSSION: Patient is a 37 year old male scheduled for the above procedure. He is a T4 paraplegic with history of surgery on his left ankle last year for osteomyelitis. He recently has developed increased drainage and odor from a large left ankle ulceration. The above procedure was recommended.    History includes former smoker, neuroblastoma T4 (paraplegia following tumor resection at age 47 months), neurogenic bladder (self cath), asthma, anemia, tachycardia, spinal surgery (fusion 1993), osteomyelitis (s/p excision left distal fibula, pin to stabilize ankle 06/02/21).    No recent labs noted. Anesthesia team to evaluate on the day of surgery.    VS:  BP Readings from Last 3 Encounters:  06/27/21 98/63  06/05/21 (!) 90/49  06/26/17 104/64   Pulse Readings from Last 3 Encounters:  06/27/21 (!) 117  06/05/21 70  06/26/17 84     PROVIDERS: [facility] is listed as where he receives primary care but denied specific PCP    LABS: For day of surgery.   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    Neurogenic bladder    has no control, occasional cath   Paraplegia [facility])    Tumor T4 @ age 37month had surgery   Pneumonia    Tachycardia     Past Surgical History:  Procedure Laterality Date   [place]   [facility]   [place] [facility]  [place] &  D EXTREMITY Left 06/02/2021   Procedure: EXCISION LEFT DISTAL FIBULA WOUND CLOSURE, PIN PLACEMENT TO STABILIZE ANKLE;  Surgeon: DNewt Minion MD;  [place]  Service: Orthopedics;  Laterality: Left;   SPINAL FUSION  1993   SPINE SURGERY     752monthand 8 years    MEDICATIONS: No current facility-administered medications for this encounter.    cyanocobalamin (VITAMIN B12) 1000 MCG tablet   ferrous sulfate 325 (65 FE) MG tablet   guaiFENesin (MUCINEX) 600 MG 12 hr tablet   AMBULATORY NON FORMULARY MEDICATION   Misc. Devices ([facility]) MISC    AlMyra GianottiPA-C Surgical Short Stay/Anesthesiology [facility] ([phone] Therapy [place] ([phone]/22/2024 10:21 AM

## 2022-05-03 NOTE — Anesthesia Preprocedure Evaluation (Addendum)
Anesthesia Evaluation  Patient identified by MRN, date of birth, ID band Patient awake    Reviewed: Allergy & Precautions, H&P , NPO status , Patient's Chart, lab work & pertinent test results  Airway Mallampati: II  TM Distance: >3 FB Neck ROM: Full    Dental no notable dental hx. (+) Dental Advisory Given, Teeth Intact   Pulmonary asthma , former smoker   Pulmonary exam normal breath sounds clear to auscultation       Cardiovascular negative cardio ROS  Rhythm:Regular Rate:Normal     Neuro/Psych T4 paraplegia  negative psych ROS   GI/Hepatic negative GI ROS, Neg liver ROS,,,  Endo/Other  negative endocrine ROS    Renal/GU negative Renal ROS  negative genitourinary   Musculoskeletal  (+) Arthritis , Osteoarthritis,    Abdominal   Peds  Hematology  (+) Blood dyscrasia, anemia   Anesthesia Other Findings   Reproductive/Obstetrics negative OB ROS                             Anesthesia Physical Anesthesia Plan  ASA: 2  Anesthesia Plan: MAC   Post-op Pain Management: Tylenol PO (pre-op)*   Induction: Intravenous  PONV Risk Score and Plan: 3 and Ondansetron, Dexamethasone, Midazolam and Propofol infusion  Airway Management Planned: Natural Airway and Simple Face Mask  Additional Equipment:   Intra-op Plan:   Post-operative Plan:   Informed Consent: I have reviewed the patients History and Physical, chart, labs and discussed the procedure including the risks, benefits and alternatives for the proposed anesthesia with the patient or authorized representative who has indicated his/her understanding and acceptance.     Dental advisory given  Plan Discussed with: CRNA  Anesthesia Plan Comments: (PAT note written 05/03/2022 by Myra Gianotti, PA-C.  )       Anesthesia Quick Evaluation

## 2022-05-04 ENCOUNTER — Observation Stay (HOSPITAL_COMMUNITY)
Admission: RE | Admit: 2022-05-04 | Discharge: 2022-05-05 | Disposition: A | Discharge status: Home / Self Care | Payer: No Typology Code available for payment source | Source: Ambulatory Visit | Attending: Orthopedic Surgery | Admitting: Orthopedic Surgery

## 2022-05-04 ENCOUNTER — Other Ambulatory Visit: Payer: Self-pay

## 2022-05-04 ENCOUNTER — Ambulatory Visit (HOSPITAL_COMMUNITY): Payer: No Typology Code available for payment source | Admitting: Vascular Surgery

## 2022-05-04 ENCOUNTER — Encounter (HOSPITAL_COMMUNITY)
Admission: RE | Disposition: A | Discharge status: Home / Self Care | Payer: Self-pay | Source: Ambulatory Visit | Attending: Orthopedic Surgery

## 2022-05-04 ENCOUNTER — Ambulatory Visit (HOSPITAL_BASED_OUTPATIENT_CLINIC_OR_DEPARTMENT_OTHER): Payer: No Typology Code available for payment source | Admitting: Vascular Surgery

## 2022-05-04 ENCOUNTER — Encounter (HOSPITAL_COMMUNITY): Payer: Self-pay | Admitting: Orthopedic Surgery

## 2022-05-04 DIAGNOSIS — L97924 Non-pressure chronic ulcer of unspecified part of left lower leg with necrosis of bone: Secondary | ICD-10-CM

## 2022-05-04 DIAGNOSIS — L97922 Non-pressure chronic ulcer of unspecified part of left lower leg with fat layer exposed: Secondary | ICD-10-CM

## 2022-05-04 DIAGNOSIS — Z87891 Personal history of nicotine dependence: Secondary | ICD-10-CM | POA: Insufficient documentation

## 2022-05-04 DIAGNOSIS — L97329 Non-pressure chronic ulcer of left ankle with unspecified severity: Secondary | ICD-10-CM

## 2022-05-04 DIAGNOSIS — J45909 Unspecified asthma, uncomplicated: Secondary | ICD-10-CM | POA: Insufficient documentation

## 2022-05-04 DIAGNOSIS — L89529 Pressure ulcer of left ankle, unspecified stage: Principal | ICD-10-CM | POA: Insufficient documentation

## 2022-05-04 DIAGNOSIS — L97929 Non-pressure chronic ulcer of unspecified part of left lower leg with unspecified severity: Secondary | ICD-10-CM | POA: Diagnosis present

## 2022-05-04 HISTORY — DX: Tachycardia, unspecified: R00.0

## 2022-05-04 HISTORY — PX: I & D EXTREMITY: SHX5045

## 2022-05-04 HISTORY — DX: Anemia, unspecified: D64.9

## 2022-05-04 HISTORY — DX: Pneumonia, unspecified organism: J18.9

## 2022-05-04 LAB — BASIC METABOLIC PANEL
Anion gap: 11 (ref 5–15)
BUN: 6 mg/dL (ref 6–20)
CO2: 22 mmol/L (ref 22–32)
Calcium: 8 mg/dL — ABNORMAL LOW (ref 8.9–10.3)
Chloride: 101 mmol/L (ref 98–111)
Creatinine, Ser: 0.41 mg/dL — ABNORMAL LOW (ref 0.61–1.24)
GFR, Estimated: >60 mL/min (ref >60)
Glucose, Bld: 85 mg/dL (ref 70–99)
Potassium: 3.3 mmol/L — ABNORMAL LOW (ref 3.5–5.1)
Sodium: 134 mmol/L — ABNORMAL LOW (ref 135–145)

## 2022-05-04 LAB — CBC
HCT: 27.8 % — ABNORMAL LOW (ref 39.0–52.0)
Hemoglobin: 7.8 g/dL — ABNORMAL LOW (ref 13.0–17.0)
MCH: 21.5 pg — ABNORMAL LOW (ref 26.0–34.0)
MCHC: 28.1 g/dL — ABNORMAL LOW (ref 30.0–36.0)
MCV: 76.8 fL — ABNORMAL LOW (ref 80.0–100.0)
Platelets: 542 10*3/uL — ABNORMAL HIGH (ref 150–400)
RBC: 3.62 MIL/uL — ABNORMAL LOW (ref 4.22–5.81)
RDW: 17.9 % — ABNORMAL HIGH (ref 11.5–15.5)
WBC: 24.8 10*3/uL — ABNORMAL HIGH (ref 4.0–10.5)
nRBC: 0 % (ref 0.0–0.2)

## 2022-05-04 SURGERY — IRRIGATION AND DEBRIDEMENT EXTREMITY
Anesthesia: General | Laterality: Left

## 2022-05-04 MED ORDER — ACETAMINOPHEN 500 MG PO TABS
1000.0000 mg | ORAL_TABLET | Freq: Once | ORAL | Status: AC
  Administered 2022-05-04: 1000 mg via ORAL

## 2022-05-04 MED ORDER — CHLORHEXIDINE GLUCONATE 0.12 % MT SOLN
OROMUCOSAL | Status: AC
  Administered 2022-05-04: 15 mL via OROMUCOSAL
  Filled 2022-05-04: qty 15

## 2022-05-04 MED ORDER — BISACODYL 10 MG RE SUPP: 10.0000 mg | Freq: Every day | RECTAL | Status: DC | PRN

## 2022-05-04 MED ORDER — OXYCODONE HCL 5 MG PO TABS: 10.0000 mg | ORAL_TABLET | ORAL | Status: DC | PRN

## 2022-05-04 MED ORDER — PROPOFOL 500 MG/50ML IV EMUL
INTRAVENOUS | Status: DC | PRN
  Administered 2022-05-04: 100 ug/kg/min via INTRAVENOUS

## 2022-05-04 MED ORDER — LACTATED RINGERS IV SOLN: INTRAVENOUS | Status: DC

## 2022-05-04 MED ORDER — HYDROMORPHONE HCL 1 MG/ML IJ SOLN: 0.5000 mg | INTRAMUSCULAR | Status: DC | PRN

## 2022-05-04 MED ORDER — MIDAZOLAM HCL 2 MG/2ML IJ SOLN
INTRAMUSCULAR | Status: AC
  Filled 2022-05-04: qty 2

## 2022-05-04 MED ORDER — SODIUM CHLORIDE 0.9 % IV SOLN: INTRAVENOUS | Status: DC

## 2022-05-04 MED ORDER — FENTANYL CITRATE (PF) 100 MCG/2ML IJ SOLN
INTRAMUSCULAR | Status: AC
  Filled 2022-05-04: qty 2

## 2022-05-04 MED ORDER — FENTANYL CITRATE (PF) 250 MCG/5ML IJ SOLN
INTRAMUSCULAR | Status: AC
  Filled 2022-05-04: qty 5

## 2022-05-04 MED ORDER — CEFAZOLIN SODIUM-DEXTROSE 2-4 GM/100ML-% IV SOLN
2.0000 g | INTRAVENOUS | Status: AC
  Administered 2022-05-04: 2 g via INTRAVENOUS

## 2022-05-04 MED ORDER — VANCOMYCIN HCL 500 MG IV SOLR
INTRAVENOUS | Status: DC | PRN
  Administered 2022-05-04: 500 mg

## 2022-05-04 MED ORDER — CEFAZOLIN SODIUM-DEXTROSE 1-4 GM/50ML-% IV SOLN: 1.0000 g | Freq: Four times a day (QID) | INTRAVENOUS | Status: DC

## 2022-05-04 MED ORDER — METOCLOPRAMIDE HCL 5 MG/ML IJ SOLN: 5.0000 mg | Freq: Three times a day (TID) | INTRAMUSCULAR | Status: DC | PRN

## 2022-05-04 MED ORDER — METHOCARBAMOL 500 MG PO TABS: 500.0000 mg | ORAL_TABLET | Freq: Four times a day (QID) | ORAL | Status: DC | PRN

## 2022-05-04 MED ORDER — METHOCARBAMOL 1000 MG/10ML IJ SOLN: 500.0000 mg | Freq: Four times a day (QID) | INTRAVENOUS | Status: DC | PRN

## 2022-05-04 MED ORDER — ACETAMINOPHEN 500 MG PO TABS
ORAL_TABLET | ORAL | Status: AC
  Filled 2022-05-04: qty 2

## 2022-05-04 MED ORDER — DOCUSATE SODIUM 100 MG PO CAPS
100.0000 mg | ORAL_CAPSULE | Freq: Two times a day (BID) | ORAL | Status: DC
  Administered 2022-05-04: 100 mg via ORAL
  Filled 2022-05-04 (×2): qty 1

## 2022-05-04 MED ORDER — ACETAMINOPHEN 325 MG PO TABS: 325.0000 mg | ORAL_TABLET | Freq: Four times a day (QID) | ORAL | Status: DC | PRN

## 2022-05-04 MED ORDER — OXYCODONE HCL 5 MG PO TABS: 5.0000 mg | ORAL_TABLET | ORAL | Status: DC | PRN

## 2022-05-04 MED ORDER — HYDROMORPHONE HCL 1 MG/ML IJ SOLN: 0.2500 mg | INTRAMUSCULAR | Status: DC | PRN

## 2022-05-04 MED ORDER — CHLORHEXIDINE GLUCONATE 0.12 % MT SOLN: 15.0000 mL | Freq: Once | OROMUCOSAL | Status: AC

## 2022-05-04 MED ORDER — 0.9 % SODIUM CHLORIDE (POUR BTL) OPTIME
TOPICAL | Status: DC | PRN
  Administered 2022-05-04: 1000 mL

## 2022-05-04 MED ORDER — CEPHALEXIN 500 MG PO CAPS
500.0000 mg | ORAL_CAPSULE | Freq: Four times a day (QID) | ORAL | Status: AC
  Administered 2022-05-04 – 2022-05-05 (×3): 500 mg via ORAL
  Filled 2022-05-04 (×4): qty 1

## 2022-05-04 MED ORDER — ONDANSETRON HCL 4 MG PO TABS: 4.0000 mg | ORAL_TABLET | Freq: Four times a day (QID) | ORAL | Status: DC | PRN

## 2022-05-04 MED ORDER — MIDAZOLAM HCL 2 MG/2ML IJ SOLN
INTRAMUSCULAR | Status: DC | PRN
  Administered 2022-05-04 (×2): 1 mg via INTRAVENOUS

## 2022-05-04 MED ORDER — CEFAZOLIN SODIUM-DEXTROSE 2-4 GM/100ML-% IV SOLN
INTRAVENOUS | Status: AC
  Filled 2022-05-04: qty 100

## 2022-05-04 MED ORDER — POLYETHYLENE GLYCOL 3350 17 G PO PACK: 17.0000 g | PACK | Freq: Every day | ORAL | Status: DC | PRN

## 2022-05-04 MED ORDER — PROPOFOL 1000 MG/100ML IV EMUL
INTRAVENOUS | Status: AC
  Filled 2022-05-04: qty 300

## 2022-05-04 MED ORDER — METOCLOPRAMIDE HCL 5 MG PO TABS: 5.0000 mg | ORAL_TABLET | Freq: Three times a day (TID) | ORAL | Status: DC | PRN

## 2022-05-04 MED ORDER — VANCOMYCIN HCL 500 MG IV SOLR
INTRAVENOUS | Status: AC
  Filled 2022-05-04: qty 10

## 2022-05-04 MED ORDER — ORAL CARE MOUTH RINSE: 15.0000 mL | Freq: Once | OROMUCOSAL | Status: AC

## 2022-05-04 MED ORDER — PHENYLEPHRINE HCL (PRESSORS) 10 MG/ML IV SOLN
INTRAVENOUS | Status: DC | PRN
  Administered 2022-05-04: 80 ug via INTRAVENOUS

## 2022-05-04 MED ORDER — ONDANSETRON HCL 4 MG/2ML IJ SOLN: 4.0000 mg | Freq: Four times a day (QID) | INTRAMUSCULAR | Status: DC | PRN

## 2022-05-04 MED ORDER — MAGNESIUM CITRATE PO SOLN: 1.0000 | Freq: Once | ORAL | Status: DC | PRN

## 2022-05-04 SURGICAL SUPPLY — 39 items
BAG COUNTER SPONGE SURGICOUNT (BAG) IMPLANT
BAG SPNG CNTER NS LX DISP (BAG)
BLADE SURG 21 STRL SS (BLADE) ×2 IMPLANT
BNDG CMPR 5X6 CHSV STRCH STRL (GAUZE/BANDAGES/DRESSINGS)
BNDG COHESIVE 6X5 TAN NS LF (GAUZE/BANDAGES/DRESSINGS) IMPLANT
BNDG COHESIVE 6X5 TAN ST LF (GAUZE/BANDAGES/DRESSINGS) IMPLANT
BNDG GAUZE DERMACEA FLUFF 4 (GAUZE/BANDAGES/DRESSINGS) ×4 IMPLANT
BNDG GZE DERMACEA 4 6PLY (GAUZE/BANDAGES/DRESSINGS) ×2
COVER SURGICAL LIGHT HANDLE (MISCELLANEOUS) ×4 IMPLANT
DRAPE INCISE IOBAN 66X45 STRL (DRAPES) IMPLANT
DRAPE U-SHAPE 47X51 STRL (DRAPES) ×2 IMPLANT
DRESSING PEEL AND PLC PRVNA 13 (GAUZE/BANDAGES/DRESSINGS) IMPLANT
DRSG ADAPTIC 3X8 NADH LF (GAUZE/BANDAGES/DRESSINGS) ×2 IMPLANT
DRSG PEEL AND PLACE PREVENA 13 (GAUZE/BANDAGES/DRESSINGS) ×1
DURAPREP 26ML APPLICATOR (WOUND CARE) ×2 IMPLANT
ELECT REM PT RETURN 9FT ADLT (ELECTROSURGICAL)
ELECTRODE REM PT RTRN 9FT ADLT (ELECTROSURGICAL) IMPLANT
GAUZE SPONGE 4X4 12PLY STRL (GAUZE/BANDAGES/DRESSINGS) ×2 IMPLANT
GLOVE BIOGEL PI IND STRL 9 (GLOVE) ×2 IMPLANT
GLOVE SURG ORTHO 9.0 STRL STRW (GLOVE) ×2 IMPLANT
GOWN STRL REUS W/ TWL XL LVL3 (GOWN DISPOSABLE) ×4 IMPLANT
GOWN STRL REUS W/TWL XL LVL3 (GOWN DISPOSABLE) ×2
GRAFT SKIN MARIGEN MICRO 38 (Tissue) IMPLANT
HANDPIECE INTERPULSE COAX TIP (DISPOSABLE)
KIT BASIN OR (CUSTOM PROCEDURE TRAY) ×2 IMPLANT
KIT DRSG PREVENA PLUS 7DAY 125 (MISCELLANEOUS) IMPLANT
KIT TURNOVER KIT B (KITS) ×2 IMPLANT
MANIFOLD NEPTUNE II (INSTRUMENTS) ×2 IMPLANT
NS IRRIG 1000ML POUR BTL (IV SOLUTION) ×2 IMPLANT
PACK ORTHO EXTREMITY (CUSTOM PROCEDURE TRAY) ×2 IMPLANT
PAD ARMBOARD 7.5X6 YLW CONV (MISCELLANEOUS) ×4 IMPLANT
SET HNDPC FAN SPRY TIP SCT (DISPOSABLE) IMPLANT
STOCKINETTE IMPERVIOUS 9X36 MD (GAUZE/BANDAGES/DRESSINGS) IMPLANT
SUT ETHILON 2 0 PSLX (SUTURE) ×2 IMPLANT
SWAB COLLECTION DEVICE MRSA (MISCELLANEOUS) ×2 IMPLANT
SWAB CULTURE ESWAB REG 1ML (MISCELLANEOUS) IMPLANT
TOWEL GREEN STERILE (TOWEL DISPOSABLE) ×2 IMPLANT
TUBE CONNECTING 12X1/4 (SUCTIONS) ×2 IMPLANT
YANKAUER SUCT BULB TIP NO VENT (SUCTIONS) ×2 IMPLANT

## 2022-05-04 NOTE — H&P (Signed)
Lee Reilly is an 37 y.o. male.   Chief Complaint: Ulceration lateral left ankle. HPI: Patient is status post distal fibula excision with tissue transfer and tissue reinforcement with Kerecis tissue graft.  Patient had completely healed the ulcer however patient has developed a new ulcer anteriorly over the ankle this is remote to his previous incision.  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    Neurogenic bladder    has no control, occasional cath   Paraplegia [facility])    Tumor T4 @ age 4month had surgery   Pneumonia    Tachycardia     Past Surgical History:  Procedure Laterality Date   [place]  [place]   [facility]   [place]  I & D EXTREMITY Left 06/02/2021   Procedure: EXCISION LEFT DISTAL FIBULA WOUND CLOSURE, PIN PLACEMENT TO STABILIZE ANKLE;  Surgeon: DNewt Minion MD;  [place]  Service: Orthopedics;  Laterality: Left;   SPINAL FUSION  1993   SPINE SURGERY     790monthand 8 years    Family History  Problem Relation Age of Onset   Diabetes Father    Colon cancer Neg Hx    Prostate cancer Neg Hx    CAD Neg Hx    Social History:  reports that he has quit smoking. He has never used smokeless tobacco. He reports current alcohol use of about 2.0 standard drinks of alcohol per week. He reports current drug use. Drug: Marijuana.  Allergies: No Known Allergies  No medications prior to admission.    No results found for this or any previous visit (from the past 48 hour(s)). No results found.  Review of Systems  All other systems reviewed and are negative.   There were no vitals taken for this visit. Physical Exam  Examination patient is alert oriented no adenopathy well-dressed normal affect normal respiratory effort.  Patient has developed a new necrotic ulcer anteriorly over the tibia anterior to his previous fibular excision.  There are brawny skin  color changes no ascending cellulitis no drainage.  The wound bed has ischemic tissue. Assessment/Plan Assessment: Ulceration anterior lateral aspect left ankle.  Plan: Will plan for debridement of the ulcer application of tissue graft and a wound VAC.  Will send tissue for cultures.  MaNewt MinionMD 05/04/2022, 11:04 AM

## 2022-05-04 NOTE — Op Note (Signed)
05/04/2022  2:28 PM  PATIENT:  Lee Reilly    PRE-OPERATIVE DIAGNOSIS:  Ulcer Left Ankle  POST-OPERATIVE DIAGNOSIS:  Same  PROCEDURE:  DEBRIDEMENT LEFT ANKLE Excision skin and soft tissue muscle fascia and bone. Application of vancomycin powder 500 mg. Application of Kerecis micro graft 38 cm. Application of cleanse choice wound VAC sponge. Tissue sent for cultures.  SURGEON:  Newt Minion, MD  PHYSICIAN ASSISTANT:None ANESTHESIA:   General  PREOPERATIVE INDICATIONS:  [name] is a  37 y.o. male with a diagnosis of Ulcer Left Ankle who failed conservative measures and elected for surgical management.    The risks benefits and alternatives were discussed with the patient preoperatively including but not limited to the risks of infection, bleeding, nerve injury, cardiopulmonary complications, the need for revision surgery, among others, and the patient was willing to proceed.  OPERATIVE IMPLANTS: Vancomycin powder 500 mg. Kerecis micro graft 38 cm.  '@ENCIMAGES'$ @  OPERATIVE FINDINGS: Skin and soft tissue muscle fascia and bone sent for cultures.  OPERATIVE PROCEDURE: Patient brought the operating room and underwent a MAC anesthetic.  After adequate levels anesthesia obtained patient's left lower extremity was prepped using DuraPrep draped into a sterile field a timeout was called.  A 21 blade knife was used to ellipse out the ulcerative tissue anterior lateral tibia.  The previous fibular incision was well-healed.  This was carried down to bone and a rondure was used to resect the bone.  The tissue margins had good petechial bleeding.  The tissue was sent for cultures.  After debridement the wound was 5 cm in diameter and 5 mm deep.  The wound was irrigated with normal saline vancomycin powder 500 mg was applied 38 cm of Kerecis micro graft was applied.  This was covered with a cleanse choice wound VAC sponge this had a good suction fit patient was taken to PACU in stable  condition.   DISCHARGE PLANNING:  Antibiotic duration: Continue IV antibiotics for 24 hours will adjust antibiotics pending cultures  Weightbearing: Weightbearing as tolerated  Pain medication: Opioid pathway  Dressing care/ Wound VAC: Continue wound VAC for 1 week  Ambulatory devices: Wheelchair  Discharge to: Return to home.  Follow-up: In the office 1 week post operative.

## 2022-05-04 NOTE — Transfer of Care (Signed)
Immediate Anesthesia Transfer of Care Note  Patient: Champ Gialanella  Procedure(s) Performed: DEBRIDEMENT LEFT ANKLE (Left)  Patient Location: PACU  Anesthesia Type:MAC  Level of Consciousness: awake and patient cooperative  Airway & Oxygen Therapy: Patient Spontanous Breathing and Patient connected to face mask oxygen  Post-op Assessment: Report given to RN and Post -op Vital signs reviewed and stable  Post vital signs: Reviewed and stable  Last Vitals:  Vitals Value Taken Time  BP 99/52 05/04/22 1421  Temp    Pulse 93 05/04/22 1422  Resp 36 05/04/22 1422  SpO2 99 % 05/04/22 1422  Vitals shown include unvalidated device data.  Last Pain:  Vitals:   05/04/22 1241  TempSrc: Oral  PainSc: 0-No pain         Complications: No notable events documented.

## 2022-05-04 NOTE — Anesthesia Postprocedure Evaluation (Signed)
Anesthesia Post Note  Patient: Lee Reilly  Procedure(s) Performed: DEBRIDEMENT LEFT ANKLE (Left)     Patient location during evaluation: PACU Anesthesia Type: General Level of consciousness: awake and alert Pain management: pain level controlled Vital Signs Assessment: post-procedure vital signs reviewed and stable Respiratory status: spontaneous breathing, nonlabored ventilation and respiratory function stable Cardiovascular status: stable and blood pressure returned to baseline Postop Assessment: no apparent nausea or vomiting Anesthetic complications: no  No notable events documented.  Last Vitals:  Vitals:   05/04/22 1430 05/04/22 1445  BP: (!) 100/54 (!) 103/59  Pulse: 94 86  Resp: 20 17  Temp:    SpO2: 97% 97%    Last Pain:  Vitals:   05/04/22 1445  TempSrc:   PainSc: 0-No pain                 Fergus Throne,W. EDMOND

## 2022-05-04 NOTE — Interval H&P Note (Signed)
History and Physical Interval Note:  05/04/2022 12:55 PM  Lee Reilly  has presented today for surgery, with the diagnosis of Ulcer Left Ankle.  The various methods of treatment have been discussed with the patient and family. After consideration of risks, benefits and other options for treatment, the patient has consented to  Procedure(s): DEBRIDEMENT LEFT ANKLE (Left) as a surgical intervention.  The patient's history has been reviewed, patient examined, no change in status, stable for surgery.  I have reviewed the patient's chart and labs.  Questions were answered to the patient's satisfaction.     Newt Minion

## 2022-05-04 NOTE — Progress Notes (Signed)
Pt is s/p I/D o ankle. Plan for 24 hrs of cefazolin post op abx. However, he lost his IV. Ok to change to PO keflex instead per Dr. Sharol Given.   Keflex '500mg'$  PO q6 x3  Onnie Boer, PharmD, [place], AAHIVP, CPP Infectious Disease Pharmacist 05/04/2022 6:33 PM

## 2022-05-05 DIAGNOSIS — J45909 Unspecified asthma, uncomplicated: Secondary | ICD-10-CM | POA: Diagnosis not present

## 2022-05-05 DIAGNOSIS — L89529 Pressure ulcer of left ankle, unspecified stage: Secondary | ICD-10-CM | POA: Diagnosis not present

## 2022-05-05 DIAGNOSIS — Z87891 Personal history of nicotine dependence: Secondary | ICD-10-CM | POA: Diagnosis not present

## 2022-05-05 MED ORDER — DOXYCYCLINE HYCLATE 100 MG PO TABS: 100.0000 mg | ORAL_TABLET | Freq: Two times a day (BID) | ORAL | 0 refills | Status: DC

## 2022-05-05 NOTE — Evaluation (Signed)
Physical Therapy Evaluation Patient Details Name: Lee Reilly MRN: [id number] DOB: Dec 19, 1985 Today's Date: 05/05/2022  History of Present Illness  Pt is a 37 y.o. 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   Clinical Impression  PT eval complete. Pt is at baseline level of function, mod I at manual wheelchair level. Pt verbalizes no questions or concerns regarding mobility at home. No further skilled PT intervention indicated. PT signing off.        Recommendations for follow up therapy are one component of a multi-disciplinary discharge planning process, led by the attending physician.  Recommendations may be updated based on patient status, additional functional criteria and insurance authorization.  Follow Up Recommendations No PT follow up      Assistance Recommended at Discharge PRN  Patient can return home with the following       Equipment Recommendations None recommended by PT  Recommendations for Other Services       Functional Status Assessment Patient has not had a recent decline in their functional status     Precautions / Restrictions Precautions Precautions: Other (comment) Precaution Comments: wound vac L ankle/foot Restrictions Weight Bearing Restrictions: Yes LLE Weight Bearing: Weight bearing as tolerated      Mobility  Bed Mobility Overal bed mobility: Modified Independent                  Transfers Overall transfer level: Modified independent Equipment used: None               General transfer comment: lateral scoot vs A/P transfer in/out of w/c    Ambulation/Gait               General Gait Details: nonambulatory at baseline  Stairs            Wheelchair Mobility    Modified Rankin (Stroke Patients Only)       Balance Overall balance  assessment:  (sitting balance intact)                                           Pertinent Vitals/Pain Pain Assessment Pain Assessment: No/denies pain    Home Living Family/patient expects to be discharged to:: Private residence Living Arrangements: Alone Available Help at Discharge: Family;Available 24 hours/day Type of Home: House Home Access: Level entry       Home Layout: One level Home Equipment: Tub bench;Grab bars - tub/shower;Wheelchair - manual Additional Comments: Pt reports he has a new w/c ordered.    Prior Function Prior Level of Function : Independent/Modified Independent;Driving;Working/employed             Mobility Comments: mod I at manual w/c level ADLs Comments: Uses briefs for BM's and urine. 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     Hand Dominance        Extremity/Trunk Assessment   Upper Extremity Assessment Upper Extremity Assessment: Overall WFL for tasks assessed    Lower Extremity Assessment Lower Extremity Assessment: RLE deficits/detail;LLE deficits/detail RLE Deficits / Details: T4 complete paraplegic LLE Deficits / Details: T4 complete paraplegic       Communication   Communication: No difficulties  Cognition Arousal/Alertness: Awake/alert Behavior During Therapy: WFL for tasks  assessed/performed Overall Cognitive Status: Within Functional Limits for tasks assessed                                 General Comments: Mildly anxious. Reports he does not like being in the hospital. Pt feels he will recover better at home.        General Comments General comments (skin integrity, edema, etc.): HR 142. SpO2 93% on RA.    Exercises     Assessment/Plan    PT Assessment Patient does not need any further PT services  PT Problem List         PT Treatment Interventions      PT Goals (Current goals can be found in the Care Plan section)  Acute  Rehab PT Goals Patient Stated Goal: home PT Goal Formulation: All assessment and education complete, DC therapy    Frequency       Co-evaluation               AM-PAC PT "6 Clicks" Mobility  Outcome Measure Help needed turning from your back to your side while in a flat bed without using bedrails?: None Help needed moving from lying on your back to sitting on the side of a flat bed without using bedrails?: None Help needed moving to and from a bed to a chair (including a wheelchair)?: None Help needed standing up from a chair using your arms (e.g., wheelchair or bedside chair)?: Total Help needed to walk in hospital room?: Total Help needed climbing 3-5 steps with a railing? : Total 6 Click Score: 15    End of Session   Activity Tolerance: Patient tolerated treatment well Patient left: in bed;with call bell/phone within reach Nurse Communication: Mobility status PT Visit Diagnosis: Other abnormalities of gait and mobility (R26.89)    Time: [id number] PT Time Calculation (min) (ACUTE ONLY): 16 min   Charges:   PT Evaluation $PT Eval Low Complexity: 1 Low          Gloriann Loan., PT  Office # [phone]   Lorriane Shire 05/05/2022, 9:37 AM

## 2022-05-05 NOTE — Progress Notes (Signed)
Patient ID: Lee Reilly, male   DOB: 22-Mar-1985, 37 y.o.   MRN: [id number] Patient is status post debridement anterior lateral left ankle ulcer.  Cultures are negative to date.  Will discharge on doxycycline.  Patient most likely will need a home health aide.  Patient will need a new wound VAC Praveena canister for discharge.

## 2022-05-05 NOTE — Discharge Summary (Signed)
Discharge Diagnoses:  Principal Problem:   Ulcer of left lower leg (HCC) Active Problems:   Skin ulcer of left lower leg with fat layer exposed ([place])   Surgeries: Procedure(s): DEBRIDEMENT LEFT ANKLE on 05/04/2022    Consultants:   Discharged Condition: Improved  Hospital Course: Lee Reilly is an 37 y.o. male who was admitted 05/04/2022 with a chief complaint of ulcer left ankle, with a final diagnosis of Ulcer Left Ankle.  Patient was brought to the operating room on 05/04/2022 and underwent Procedure(s): DEBRIDEMENT LEFT ANKLE.    Patient was given perioperative antibiotics:  Anti-infectives (From admission, onward)    Start     Dose/Rate Route Frequency Ordered Stop   05/05/22 0000  doxycycline (VIBRA-TABS) 100 MG tablet        100 mg Oral 2 times daily 05/05/22 1005     05/04/22 2200  cephALEXin (KEFLEX) capsule 500 mg        500 mg Oral Every 6 hours 05/04/22 1834 05/05/22 1759   05/04/22 2030  ceFAZolin (ANCEF) IVPB 1 g/50 mL premix  Status:  Discontinued        1 g 100 mL/hr over 30 Minutes Intravenous Every 6 hours 05/04/22 1935 05/04/22 1939   05/04/22 1409  vancomycin (VANCOCIN) powder  Status:  Discontinued          As needed 05/04/22 1409 05/04/22 1418   05/04/22 1230  ceFAZolin (ANCEF) IVPB 2g/100 mL premix        2 g 200 mL/hr over 30 Minutes Intravenous On call to O.R. 05/04/22 1213 05/04/22 1355   05/04/22 1220  ceFAZolin (ANCEF) 2-4 GM/100ML-% IVPB       Note to Pharmacy: [facility] Floor: cabinet override      05/04/22 1220 05/04/22 1358     .  Patient was given sequential compression devices, early ambulation, and aspirin for DVT prophylaxis.  Recent vital signs: Patient Vitals for the past 24 hrs:  BP Temp Temp src Pulse Resp SpO2 Height Weight  05/05/22 0751 (!) 92/53 98 F (36.7 C) Oral (!) 129 18 100 % -- --  05/05/22 0538 (!) 97/49 -- -- (!) 113 -- -- -- --  05/05/22 0428 (!) 99/44 97.8 F (36.6 C) Oral (!) 110 18 98 % -- --  05/04/22 1938 (!)  107/54 97.8 F (36.6 C) -- (!) 111 18 96 % -- --  05/04/22 1745 105/63 -- -- 96 20 98 % -- --  05/04/22 1715 (!) 107/57 -- -- 91 20 97 % -- --  05/04/22 1645 (!) 110/58 -- -- 93 20 97 % -- --  05/04/22 1615 (!) 102/59 -- -- 81 20 97 % -- --  05/04/22 1545 (!) 98/58 97.8 F (36.6 C) -- 90 20 97 % -- --  05/04/22 1530 (!) 95/56 -- -- 87 18 97 % -- --  05/04/22 1515 (!) 102/59 -- -- 88 20 97 % -- --  05/04/22 1500 (!) 99/56 -- -- 87 (!) 21 95 % -- --  05/04/22 1445 (!) 103/59 -- -- 86 17 97 % -- --  05/04/22 1430 (!) 100/54 -- -- 94 20 97 % -- --  05/04/22 1421 (!) 99/52 97.8 F (36.6 C) -- 88 20 99 % -- --  05/04/22 1241 90/62 99.2 F (37.3 C) Oral (!) 128 (!) 22 97 % '5\' 3"'$  (1.6 m) 52.2 kg  .  Recent laboratory studies: No results found.  Discharge Medications:   Allergies as of 05/05/2022   No Known  Allergies      Medication List     TAKE these medications    AMBULATORY NON FORMULARY MEDICATION Extra Seat Cushion for Wheelchair.   cyanocobalamin 1000 MCG tablet Commonly known as: VITAMIN B12 Take 1,000 mcg by mouth 3 (three) times a week.   doxycycline 100 MG tablet Commonly known as: VIBRA-TABS Take 1 tablet (100 mg total) by mouth 2 (two) times daily.   ferrous sulfate 325 (65 FE) MG tablet Take 1 tablet (325 mg total) by mouth daily. What changed: when to take this   guaiFENesin 600 MG 12 hr tablet Commonly known as: MUCINEX Take 600 mg by mouth 2 (two) times daily as needed for cough (congestion).   Wheelchair [employer] Use as directed with wheelchair.        Diagnostic Studies: No results found.  Patient benefited maximally from their hospital stay and there were no complications.     Disposition: Discharge disposition: 01-Home or Self Care      Discharge Instructions     Call MD / Call 911   Complete by: As directed    If you experience chest pain or shortness of breath, CALL 911 and be transported to the hospital emergency room.  If you  develope a fever above 101 F, pus (white drainage) or increased drainage or redness at the wound, or calf pain, call your surgeon's office.   Constipation Prevention   Complete by: As directed    Drink plenty of fluids.  Prune juice may be helpful.  You may use a stool softener, such as Colace (over the counter) 100 mg twice a day.  Use MiraLax (over the counter) for constipation as needed.   Diet - low sodium heart healthy   Complete by: As directed    Increase activity slowly as tolerated   Complete by: As directed    Negative Pressure Wound Therapy - Incisional   Complete by: As directed    Please provide new canister for patient for discharge.   Post-operative opioid taper instructions:   Complete by: As directed    POST-OPERATIVE OPIOID TAPER INSTRUCTIONS: It is important to wean off of your opioid medication as soon as possible. If you do not need pain medication after your surgery it is ok to stop day one. Opioids include: Codeine, Hydrocodone(Norco, Vicodin), Oxycodone(Percocet, oxycontin) and hydromorphone amongst others.  Long term and even short term use of opiods can cause: Increased pain response Dependence Constipation Depression Respiratory depression And more.  Withdrawal symptoms can include Flu like symptoms Nausea, vomiting And more Techniques to manage these symptoms Hydrate well Eat regular healthy meals Stay active Use relaxation techniques(deep breathing, meditating, yoga) Do Not substitute Alcohol to help with tapering If you have been on opioids for less than two weeks and do not have pain than it is ok to stop all together.  Plan to wean off of opioids This plan should start within one week post op of your joint replacement. Maintain the same interval or time between taking each dose and first decrease the dose.  Cut the total daily intake of opioids by one tablet each day Next start to increase the time between doses. The last dose that should be  eliminated is the evening dose.          Follow-up Information     Newt Minion, MD Follow up in 1 week(s).   Specialty: Orthopedic Surgery Contact information: [address] [place] [address] [phone]  Signed: Newt Minion 05/05/2022, 10:06 AM

## 2022-05-05 NOTE — Progress Notes (Signed)
Prt's BP 92/53, HR 129-130. Pt is asymptomatic; no pain. Per dr Sharol Given encourage fluids.

## 2022-05-05 NOTE — Plan of Care (Signed)
  Problem: Education: Goal: Knowledge of General Education information will improve Description: Including pain rating scale, medication(s)/side effects and non-pharmacologic comfort measures Outcome: Progressing   Problem: Nutrition: Goal: Adequate nutrition will be maintained Outcome: Progressing   Problem: Coping: Goal: Level of anxiety will decrease Outcome: Progressing   

## 2022-05-05 NOTE — Consult Note (Signed)
WOC Nurse Consult Note: Reason for Consult:Patient with full thickness wounds to left elbow and left hip. 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 Sharol Given. Wound type: full thickness  Pressure Injury POA: No Measurement:Bedside RN to measure and document measurements for these skin injuries on Nursing Flow Sheet with application of first dressing today. Dressing procedure/placement/frequency:I will provide Nursing with conservative care guidance for the care of these lesions using an antimicrobial nonadherent (xeroform) and changed daily.  [facility] nursing team will not follow, but will remain available to this patient, the nursing and medical teams.  Please re-consult if needed.  Thank you for inviting [place] to participate in this patient's Plan of Care.  Maudie Flakes, MSN, RN, CNS, [place], Serita Grammes, [employer], [employer] phone:  [phone]

## 2022-05-05 NOTE — Plan of Care (Signed)
  Problem: Education: Goal: Knowledge of General Education information will improve Description: Including pain rating scale, medication(s)/side effects and non-pharmacologic comfort measures Outcome: Progressing   Problem: Health Behavior/Discharge Planning: Goal: Ability to manage health-related needs will improve Outcome: Progressing   Problem: Skin Integrity: Goal: Risk for impaired skin integrity will decrease Outcome: Progressing

## 2022-05-06 ENCOUNTER — Other Ambulatory Visit: Payer: Self-pay | Admitting: Orthopedic Surgery

## 2022-05-06 MED ORDER — CIPROFLOXACIN HCL 500 MG PO TABS: 500.0000 mg | ORAL_TABLET | Freq: Two times a day (BID) | ORAL | 0 refills | Status: DC

## 2022-05-07 ENCOUNTER — Telehealth: Payer: Self-pay | Admitting: Orthopedic Surgery

## 2022-05-07 ENCOUNTER — Encounter (HOSPITAL_COMMUNITY): Payer: Self-pay | Admitting: Orthopedic Surgery

## 2022-05-07 DIAGNOSIS — R000 Tachycardia, unspecified: Secondary | ICD-10-CM | POA: Diagnosis not present

## 2022-05-07 DIAGNOSIS — S71002A Unspecified open wound, left hip, initial encounter: Secondary | ICD-10-CM | POA: Diagnosis not present

## 2022-05-07 DIAGNOSIS — Z1331 Encounter for screening for depression: Secondary | ICD-10-CM | POA: Diagnosis not present

## 2022-05-07 DIAGNOSIS — Z1330 Encounter for screening examination for mental health and behavioral disorders, unspecified: Secondary | ICD-10-CM | POA: Diagnosis not present

## 2022-05-07 DIAGNOSIS — T148XXA Other injury of unspecified body region, initial encounter: Secondary | ICD-10-CM | POA: Diagnosis not present

## 2022-05-07 DIAGNOSIS — S71102A Unspecified open wound, left thigh, initial encounter: Secondary | ICD-10-CM | POA: Diagnosis not present

## 2022-05-07 DIAGNOSIS — Z993 Dependence on wheelchair: Secondary | ICD-10-CM | POA: Diagnosis not present

## 2022-05-07 DIAGNOSIS — J4520 Mild intermittent asthma, uncomplicated: Secondary | ICD-10-CM | POA: Diagnosis not present

## 2022-05-07 DIAGNOSIS — D508 Other iron deficiency anemias: Secondary | ICD-10-CM | POA: Diagnosis not present

## 2022-05-07 DIAGNOSIS — M6289 Other specified disorders of muscle: Secondary | ICD-10-CM | POA: Diagnosis not present

## 2022-05-07 DIAGNOSIS — G8220 Paraplegia, unspecified: Secondary | ICD-10-CM | POA: Diagnosis not present

## 2022-05-07 LAB — AEROBIC/ANAEROBIC CULTURE W GRAM STAIN (SURGICAL/DEEP WOUND): Gram Stain: NONE SEEN

## 2022-05-07 NOTE — Telephone Encounter (Signed)
Called pt, lm on vm of cipro being sent into pharmacy and needs to start taking.

## 2022-05-07 NOTE — Telephone Encounter (Signed)
From: Newt Minion, MD  Sent: 05/06/2022  10:55 AM EST  To: Pamella Pert, RMA   Rx sent for cipro

## 2022-05-08 DIAGNOSIS — S71102A Unspecified open wound, left thigh, initial encounter: Secondary | ICD-10-CM | POA: Diagnosis not present

## 2022-05-08 DIAGNOSIS — M24151 Other articular cartilage disorders, right hip: Secondary | ICD-10-CM | POA: Diagnosis not present

## 2022-05-08 DIAGNOSIS — Q6589 Other specified congenital deformities of hip: Secondary | ICD-10-CM | POA: Diagnosis not present

## 2022-05-08 DIAGNOSIS — M24152 Other articular cartilage disorders, left hip: Secondary | ICD-10-CM | POA: Diagnosis not present

## 2022-05-08 DIAGNOSIS — S71002A Unspecified open wound, left hip, initial encounter: Secondary | ICD-10-CM | POA: Diagnosis not present

## 2022-05-08 NOTE — Progress Notes (Signed)
Pt has a chronic wound on his left thigh; measurements are taken and documented. Dressing is changed per wound care instructions. Dr Sharol Given is aware.

## 2022-05-10 ENCOUNTER — Inpatient Hospital Stay (HOSPITAL_COMMUNITY)
Admission: AD | Admit: 2022-05-10 | Discharge: 2022-05-18 | DRG: 463 | Disposition: A | Discharge status: Home / Self Care | Payer: No Typology Code available for payment source | Source: Ambulatory Visit | Attending: Internal Medicine | Admitting: Internal Medicine

## 2022-05-10 ENCOUNTER — Encounter (HOSPITAL_COMMUNITY): Payer: Self-pay

## 2022-05-10 DIAGNOSIS — D62 Acute posthemorrhagic anemia: Secondary | ICD-10-CM | POA: Diagnosis not present

## 2022-05-10 DIAGNOSIS — L89026 Pressure-induced deep tissue damage of left elbow: Secondary | ICD-10-CM | POA: Diagnosis present

## 2022-05-10 DIAGNOSIS — M86652 Other chronic osteomyelitis, left thigh: Secondary | ICD-10-CM | POA: Diagnosis not present

## 2022-05-10 DIAGNOSIS — N319 Neuromuscular dysfunction of bladder, unspecified: Secondary | ICD-10-CM | POA: Diagnosis present

## 2022-05-10 DIAGNOSIS — L02416 Cutaneous abscess of left lower limb: Secondary | ICD-10-CM | POA: Diagnosis not present

## 2022-05-10 DIAGNOSIS — G709 Myoneural disorder, unspecified: Secondary | ICD-10-CM | POA: Diagnosis not present

## 2022-05-10 DIAGNOSIS — N132 Hydronephrosis with renal and ureteral calculous obstruction: Secondary | ICD-10-CM | POA: Diagnosis present

## 2022-05-10 DIAGNOSIS — M86152 Other acute osteomyelitis, left femur: Principal | ICD-10-CM | POA: Diagnosis present

## 2022-05-10 DIAGNOSIS — F329 Major depressive disorder, single episode, unspecified: Secondary | ICD-10-CM | POA: Diagnosis present

## 2022-05-10 DIAGNOSIS — M609 Myositis, unspecified: Secondary | ICD-10-CM | POA: Diagnosis present

## 2022-05-10 DIAGNOSIS — Z79899 Other long term (current) drug therapy: Secondary | ICD-10-CM

## 2022-05-10 DIAGNOSIS — N4283 Cyst of prostate: Secondary | ICD-10-CM | POA: Diagnosis present

## 2022-05-10 DIAGNOSIS — D509 Iron deficiency anemia, unspecified: Secondary | ICD-10-CM | POA: Diagnosis not present

## 2022-05-10 DIAGNOSIS — K610 Anal abscess: Secondary | ICD-10-CM | POA: Diagnosis present

## 2022-05-10 DIAGNOSIS — L732 Hidradenitis suppurativa: Secondary | ICD-10-CM | POA: Diagnosis present

## 2022-05-10 DIAGNOSIS — E876 Hypokalemia: Secondary | ICD-10-CM | POA: Diagnosis present

## 2022-05-10 DIAGNOSIS — E872 Acidosis, unspecified: Secondary | ICD-10-CM | POA: Diagnosis present

## 2022-05-10 DIAGNOSIS — J45909 Unspecified asthma, uncomplicated: Secondary | ICD-10-CM | POA: Diagnosis not present

## 2022-05-10 DIAGNOSIS — I9589 Other hypotension: Secondary | ICD-10-CM | POA: Diagnosis present

## 2022-05-10 DIAGNOSIS — M869 Osteomyelitis, unspecified: Secondary | ICD-10-CM | POA: Diagnosis not present

## 2022-05-10 DIAGNOSIS — Q6589 Other specified congenital deformities of hip: Secondary | ICD-10-CM | POA: Diagnosis not present

## 2022-05-10 DIAGNOSIS — G8220 Paraplegia, unspecified: Secondary | ICD-10-CM | POA: Diagnosis present

## 2022-05-10 DIAGNOSIS — E43 Unspecified severe protein-calorie malnutrition: Secondary | ICD-10-CM | POA: Diagnosis present

## 2022-05-10 DIAGNOSIS — Z85858 Personal history of malignant neoplasm of other endocrine glands: Secondary | ICD-10-CM

## 2022-05-10 DIAGNOSIS — D649 Anemia, unspecified: Secondary | ICD-10-CM | POA: Diagnosis not present

## 2022-05-10 DIAGNOSIS — Z7689 Persons encountering health services in other specified circumstances: Secondary | ICD-10-CM | POA: Diagnosis not present

## 2022-05-10 DIAGNOSIS — L03317 Cellulitis of buttock: Secondary | ICD-10-CM | POA: Diagnosis present

## 2022-05-10 DIAGNOSIS — L97329 Non-pressure chronic ulcer of left ankle with unspecified severity: Secondary | ICD-10-CM | POA: Diagnosis present

## 2022-05-10 DIAGNOSIS — L8944 Pressure ulcer of contiguous site of back, buttock and hip, stage 4: Secondary | ICD-10-CM

## 2022-05-10 DIAGNOSIS — D638 Anemia in other chronic diseases classified elsewhere: Secondary | ICD-10-CM | POA: Diagnosis present

## 2022-05-10 DIAGNOSIS — L8990 Pressure ulcer of unspecified site, unspecified stage: Secondary | ICD-10-CM | POA: Diagnosis present

## 2022-05-10 DIAGNOSIS — I10 Essential (primary) hypertension: Secondary | ICD-10-CM | POA: Diagnosis present

## 2022-05-10 DIAGNOSIS — Z981 Arthrodesis status: Secondary | ICD-10-CM

## 2022-05-10 DIAGNOSIS — K651 Peritoneal abscess: Secondary | ICD-10-CM | POA: Diagnosis present

## 2022-05-10 DIAGNOSIS — Z87891 Personal history of nicotine dependence: Secondary | ICD-10-CM | POA: Diagnosis not present

## 2022-05-10 DIAGNOSIS — R000 Tachycardia, unspecified: Secondary | ICD-10-CM | POA: Diagnosis present

## 2022-05-10 DIAGNOSIS — L89220 Pressure ulcer of left hip, unstageable: Secondary | ICD-10-CM | POA: Diagnosis present

## 2022-05-10 DIAGNOSIS — L02215 Cutaneous abscess of perineum: Secondary | ICD-10-CM | POA: Diagnosis not present

## 2022-05-10 DIAGNOSIS — B964 Proteus (mirabilis) (morganii) as the cause of diseases classified elsewhere: Secondary | ICD-10-CM | POA: Diagnosis present

## 2022-05-10 DIAGNOSIS — Z419 Encounter for procedure for purposes other than remedying health state, unspecified: Secondary | ICD-10-CM | POA: Diagnosis not present

## 2022-05-10 DIAGNOSIS — B966 Bacteroides fragilis [B. fragilis] as the cause of diseases classified elsewhere: Secondary | ICD-10-CM | POA: Diagnosis present

## 2022-05-10 DIAGNOSIS — Z993 Dependence on wheelchair: Secondary | ICD-10-CM

## 2022-05-10 DIAGNOSIS — Z792 Long term (current) use of antibiotics: Secondary | ICD-10-CM

## 2022-05-10 LAB — CBC WITH DIFFERENTIAL/PLATELET
Abs Immature Granulocytes: 0.09 10*3/uL — ABNORMAL HIGH (ref 0.00–0.07)
Basophils Absolute: 0.1 10*3/uL (ref 0.0–0.1)
Basophils Relative: 1 %
Eosinophils Absolute: 0.5 10*3/uL (ref 0.0–0.5)
Eosinophils Relative: 3 %
HCT: 25.2 % — ABNORMAL LOW (ref 39.0–52.0)
Hemoglobin: 7.4 g/dL — ABNORMAL LOW (ref 13.0–17.0)
Immature Granulocytes: 1 %
Lymphocytes Relative: 9 %
Lymphs Abs: 1.6 10*3/uL (ref 0.7–4.0)
MCH: 22 pg — ABNORMAL LOW (ref 26.0–34.0)
MCHC: 29.4 g/dL — ABNORMAL LOW (ref 30.0–36.0)
MCV: 74.8 fL — ABNORMAL LOW (ref 80.0–100.0)
Monocytes Absolute: 0.7 10*3/uL (ref 0.1–1.0)
Monocytes Relative: 4 %
Neutro Abs: 13.6 10*3/uL — ABNORMAL HIGH (ref 1.7–7.7)
Neutrophils Relative %: 82 %
Platelets: 550 10*3/uL — ABNORMAL HIGH (ref 150–400)
RBC: 3.37 MIL/uL — ABNORMAL LOW (ref 4.22–5.81)
RDW: 17.9 % — ABNORMAL HIGH (ref 11.5–15.5)
WBC: 16.5 10*3/uL — ABNORMAL HIGH (ref 4.0–10.5)
nRBC: 0 % (ref 0.0–0.2)

## 2022-05-10 LAB — VITAMIN B12: Vitamin B-12: 825 pg/mL (ref 180–914)

## 2022-05-10 LAB — COMPREHENSIVE METABOLIC PANEL
ALT: 30 U/L (ref 0–44)
AST: 36 U/L (ref 15–41)
Albumin: <1.5 g/dL — ABNORMAL LOW (ref 3.5–5.0)
Alkaline Phosphatase: 88 U/L (ref 38–126)
Anion gap: 8 (ref 5–15)
BUN: 7 mg/dL (ref 6–20)
CO2: 21 mmol/L — ABNORMAL LOW (ref 22–32)
Calcium: 8 mg/dL — ABNORMAL LOW (ref 8.9–10.3)
Chloride: 110 mmol/L (ref 98–111)
Creatinine, Ser: 0.39 mg/dL — ABNORMAL LOW (ref 0.61–1.24)
GFR, Estimated: >60 mL/min (ref >60)
Glucose, Bld: 97 mg/dL (ref 70–99)
Potassium: 3.1 mmol/L — ABNORMAL LOW (ref 3.5–5.1)
Sodium: 139 mmol/L (ref 135–145)
Total Bilirubin: 0.4 mg/dL (ref 0.3–1.2)
Total Protein: 7.7 g/dL (ref 6.5–8.1)

## 2022-05-10 LAB — RETICULOCYTES
Immature Retic Fract: 25 % — ABNORMAL HIGH (ref 2.3–15.9)
RBC.: 3.42 MIL/uL — ABNORMAL LOW (ref 4.22–5.81)
Retic Count, Absolute: 63.3 10*3/uL (ref 19.0–186.0)
Retic Ct Pct: 1.9 % (ref 0.4–3.1)

## 2022-05-10 LAB — PHOSPHORUS: Phosphorus: 2.6 mg/dL (ref 2.5–4.6)

## 2022-05-10 LAB — PROTIME-INR
INR: 1.2 (ref 0.8–1.2)
Prothrombin Time: 15.3 seconds — ABNORMAL HIGH (ref 11.4–15.2)

## 2022-05-10 LAB — IRON AND TIBC
Iron: 15 ug/dL — ABNORMAL LOW (ref 45–182)
Saturation Ratios: 13 % — ABNORMAL LOW (ref 17.9–39.5)
TIBC: 115 ug/dL — ABNORMAL LOW (ref 250–450)
UIBC: 100 ug/dL

## 2022-05-10 LAB — TSH: TSH: 1.689 u[IU]/mL (ref 0.350–4.500)

## 2022-05-10 LAB — MAGNESIUM: Magnesium: 1.7 mg/dL (ref 1.7–2.4)

## 2022-05-10 LAB — FERRITIN: Ferritin: 240 ng/mL (ref 24–336)

## 2022-05-10 LAB — FOLATE: Folate: 7.6 ng/mL (ref >5.9)

## 2022-05-10 MED ORDER — HYDROCODONE-ACETAMINOPHEN 5-325 MG PO TABS: 1.0000 | ORAL_TABLET | ORAL | Status: DC | PRN

## 2022-05-10 MED ORDER — ACETAMINOPHEN 325 MG PO TABS: 650.0000 mg | ORAL_TABLET | Freq: Four times a day (QID) | ORAL | Status: DC | PRN

## 2022-05-10 MED ORDER — SODIUM CHLORIDE 0.9 % IV SOLN: INTRAVENOUS | Status: AC

## 2022-05-10 MED ORDER — VANCOMYCIN HCL IN DEXTROSE 1-5 GM/200ML-% IV SOLN
1000.0000 mg | Freq: Three times a day (TID) | INTRAVENOUS | Status: DC
  Administered 2022-05-10 – 2022-05-11 (×2): 1000 mg via INTRAVENOUS
  Filled 2022-05-10 (×2): qty 200

## 2022-05-10 MED ORDER — ACETAMINOPHEN 650 MG RE SUPP: 650.0000 mg | Freq: Four times a day (QID) | RECTAL | Status: DC | PRN

## 2022-05-10 MED ORDER — DM-GUAIFENESIN ER 30-600 MG PO TB12
1.0000 | ORAL_TABLET | Freq: Two times a day (BID) | ORAL | Status: DC
  Administered 2022-05-10 – 2022-05-18 (×10): 1 via ORAL
  Filled 2022-05-10 (×16): qty 1

## 2022-05-10 MED ORDER — PIPERACILLIN-TAZOBACTAM 3.375 G IVPB
3.3750 g | Freq: Three times a day (TID) | INTRAVENOUS | Status: DC
  Administered 2022-05-11: 3.375 g via INTRAVENOUS
  Filled 2022-05-10: qty 50

## 2022-05-10 NOTE — Assessment & Plan Note (Signed)
Will likely need complex care requiring plastic surgery consult as well as orthopedics as well as ID

## 2022-05-10 NOTE — H&P (Signed)
Male [name] [id number] DOB: 1986-02-27 DOA: 05/10/2022     PCP: Patient, No Pcp Per   Outpatient Specialists:  Orthopedics Dr.  Sharol Given  Patient arrived to ER on  at  Referred by Attending Default, Provider, MD   Patient coming from:    home Lives alone mother lives close by    Chief Complaint: left hip wound   HPI: Lee Reilly is a 37 y.o. 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     Presented with   osteomyelitis Patient with known history of T4 paraplegia secondary to infancy neuroblastoma wheelchair-bound at baseline with chronic left hip wound recent left ankle I&D (05/04/22) for OM and ulcer and discharged on 05/05/22 with 30 day course of doxycycline due to negative culture. However, on 05/07/22 he was prescribed a 20 day course of cipro as cultures returned with (providencia stuartii, pseudomonas, and bacteroides).    He presented to his PCP office on 2/26 for follow up and WBC noted to be 22.7; (up from 2/23 was 24.8) and WBC on 2/29 at [facility] ER noted to be 16.7. He was referred to wound care outpatient, wound cultures were obtained (reportedly positive but cannot see organisms), and imaging ordered via xray of B/L hips. Appears he then presented to [facility] ER due to positive wound cultures and leukocytosis.    MRI left hip which reveled multiple findings subcutaneous abscess involving perineum, inner thigh. Wound noted in lateral hip measuring 4.6 x 5.3 cm with cellulitis involving gluteal muscle and osteomyelitis of greater trochanter. Case was discussed with Dr. Sharol Given who recommended transfer to [facility] for multi-disciplinary approach to likely include ortho, general surgery, ID, and possibly plastic surgery.  He was started on vancomycin and Zosyn CRP  noted to be elevated at 155 which is actually slightly down from 178 on 26 February Hemoglobin 7.8 Patient tachycardic up to 121  He has wound vac in place  No fever or chills He has chronic incontinence Denies any bleeding no black stools     Regarding pertinent Chronic problems:      Chronic anemia - baseline hg Hemoglobin & Hematocrit  Recent Labs    06/05/21 0217 05/04/22 1303 05/10/22 2203  HGB 8.5* 7.8* 7.4*     While in ER:   Had MRi donw showing osteomyelitis, results are not available In EPIC Case was discussed with orthopedics plan was for Dr. Sharol Given to see patient    Following Medications were ordered in ER: Medications - No data to display  _______________________________________________________ ER Provider Called: Yates with orthopedics They Recommend admit to medicine  Will see in AM     ED Triage Vitals  Enc Vitals Group     BP      Pulse      Resp      Temp      Temp src      SpO2      Weight      Height      Head Circumference      Peak Flow      Pain Score      Pain Loc      Pain Edu?      Excl. in [place]?   [id number]     _________________________________________ Significant initial  Findings: Abnormal Labs Reviewed  COMPREHENSIVE METABOLIC PANEL - Abnormal; Notable for the  following components:      Result Value   Potassium 3.1 (*)    CO2 21 (*)    Creatinine, Ser 0.39 (*)    Calcium 8.0 (*)    Albumin <1.5 (*)    All other components within normal limits  CBC WITH DIFFERENTIAL/PLATELET - Abnormal; Notable for the following components:   WBC 16.5 (*)    RBC 3.37 (*)    Hemoglobin 7.4 (*)    HCT 25.2 (*)    MCV 74.8 (*)    MCH 22.0 (*)    MCHC 29.4 (*)    RDW 17.9 (*)    Platelets 550 (*)    Neutro Abs 13.6 (*)    Abs Immature Granulocytes 0.09 (*)    All other components within normal limits  PROTIME-INR - Abnormal; Notable for the following components:   Prothrombin Time 15.3 (*)    All other components within normal limits   RETICULOCYTES - Abnormal; Notable for the following components:   RBC. 3.42 (*)    Immature Retic Fract 25.0 (*)    All other components within normal limits  IRON AND TIBC - Abnormal; Notable for the following components:   Iron 15 (*)    TIBC 115 (*)    Saturation Ratios 13 (*)    All other components within normal limits     ECG: Ordered    ____________________ This patient meets SIRS Criteria and may be septic.   WBC     Component Value Date/Time   WBC 16.5 (H) 05/10/2022 2203   LYMPHSABS 1.6 05/10/2022 2203   MONOABS 0.7 05/10/2022 2203   EOSABS 0.5 05/10/2022 2203   BASOSABS 0.1 05/10/2022 2203      UA ordered    Results for orders placed or performed during the hospital encounter of 05/04/22  Aerobic/Anaerobic Culture w Gram Stain (surgical/deep wound)     Status: None   Collection Time: 05/04/22  2:05 PM   Specimen: Soft Tissue, Other  Result Value Ref Range Status   Specimen Description TISSUE LEFT ANKLE  Final   Special Requests NONE  Final   Gram Stain NO WBC SEEN NO ORGANISMS SEEN   Final   Culture   Final    RARE PROVIDENCIA STUARTII RARE PSEUDOMONAS AERUGINOSA RARE BACTEROIDES FRAGILIS BETA LACTAMASE NEGATIVE Performed at [facility], [place] [address], [place], [place] [address]    Report Status 05/07/2022 FINAL  Final   Organism ID, Bacteria PROVIDENCIA STUARTII  Final   Organism ID, Bacteria PSEUDOMONAS AERUGINOSA  Final      Susceptibility   Pseudomonas aeruginosa - MIC*    CEFTAZIDIME 4 SENSITIVE Sensitive     CIPROFLOXACIN <=0.25 SENSITIVE Sensitive     GENTAMICIN <=1 SENSITIVE Sensitive     IMIPENEM 2 SENSITIVE Sensitive     PIP/TAZO 8 SENSITIVE Sensitive     CEFEPIME 2 SENSITIVE Sensitive     * RARE PSEUDOMONAS AERUGINOSA   Providencia stuartii - MIC*    AMPICILLIN RESISTANT Resistant     CEFEPIME <=0.12 SENSITIVE Sensitive     CEFTAZIDIME <=1 SENSITIVE Sensitive     CEFTRIAXONE <=0.25 SENSITIVE Sensitive     CIPROFLOXACIN  <=0.25 SENSITIVE Sensitive     GENTAMICIN RESISTANT Resistant     IMIPENEM 2 SENSITIVE Sensitive     TRIMETH/SULFA <=20 SENSITIVE Sensitive     AMPICILLIN/SULBACTAM 16 INTERMEDIATE Intermediate     PIP/TAZO <=4 SENSITIVE Sensitive     * RARE PROVIDENCIA STUARTII     _______________________________________________  Hospitalist was called for admission for osteomyelitis   The following Work up has been ordered so far:  No orders of the defined types were placed in this encounter.    OTHER Significant initial  Findings:  labs showing:  Recent Labs  Lab 05/04/22 1303 05/10/22 2203  NA 134* 139  K 3.3* 3.1*  CO2 22 21*  GLUCOSE 85 97  BUN 6 7  CREATININE 0.41* 0.39*  CALCIUM 8.0* 8.0*  MG  --  1.7  PHOS  --  2.6    Cr  stable,   Lab Results  Component Value Date   CREATININE 0.41 (L) 05/04/2022   CREATININE <0.30 (L) 06/04/2021   CREATININE 0.35 (L) 06/03/2021    Recent Labs  Lab 05/10/22 2203  AST 36  ALT 30  ALKPHOS 88  BILITOT 0.4  PROT 7.7  ALBUMIN <1.5*   Lab Results  Component Value Date   CALCIUM 8.0 (L) 05/04/2022    Plt: Lab Results  Component Value Date   PLT 542 (H) 05/04/2022    COVID-19 Labs  No results for input(s): "DDIMER", "FERRITIN", "LDH", "CRP" in the last 72 hours.  Lab Results  Component Value Date   SARSCOV2NAA NEGATIVE 05/29/2021       Recent Labs  Lab 05/04/22 1303  WBC 24.8*  HGB 7.8*  HCT 27.8*  MCV 76.8*  PLT 542*    HG/HCT   Down from baseline see below    Component Value Date/Time   HGB 7.8 (L) 05/04/2022 1303   HCT 27.8 (L) 05/04/2022 1303   MCV 76.8 (L) 05/04/2022 1303        Cultures:    Component Value Date/Time   SDES TISSUE LEFT ANKLE 05/04/2022 1405   SPECREQUEST NONE 05/04/2022 1405   CULT  05/04/2022 1405    RARE PROVIDENCIA STUARTII RARE PSEUDOMONAS AERUGINOSA RARE BACTEROIDES FRAGILIS BETA LACTAMASE NEGATIVE Performed at [facility], [place] [address] [place], [place]  [address]    REPTSTATUS 05/07/2022 FINAL 05/04/2022 1405     Radiological Exams on Admission: No results found. _______________________________________________________________________________________________________ Latest  There were no vitals taken for this visit.   Vitals  labs and radiology finding personally reviewed  Review of Systems:    Pertinent positives include:   non-productive cough Constitutional:  No weight loss, night sweats, Fevers, chills, fatigue, weight loss  HEENT:  No headaches, Difficulty swallowing,Tooth/dental problems,Sore throat,  No sneezing, itching, ear ache, nasal congestion, post nasal drip,  Cardio-vascular:  No chest pain, Orthopnea, PND, anasarca, dizziness, palpitations.no Bilateral lower extremity swelling  GI:  No heartburn, indigestion, abdominal pain, nausea, vomiting, diarrhea, change in bowel habits, loss of appetite, melena, blood in stool, hematemesis Resp:  no shortness of breath at rest. No dyspnea on exertion, No excess mucus, no productive cough, No , No coughing up of blood.No change in color of mucus.No wheezing. Skin:  no rash or lesions. No jaundice GU:  no dysuria, change in color of urine, no urgency or frequency. No straining to urinate.  No flank pain.  Musculoskeletal:  No joint pain or no joint swelling. No decreased range of motion. No back pain.  Psych:  No change in mood or affect. No depression or anxiety. No memory loss.  Neuro: no localizing neurological complaints, no tingling, no weakness, no double vision, no gait abnormality, no slurred speech, no confusion  All systems reviewed and apart from [place] all are negative _______________________________________________________________________________________________ Past Medical History:   Past Medical History:  Diagnosis Date  Acne 05/01/2013   Anemia    Asthma    Neuroblastoma ([place])    @ 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    Neurogenic bladder    has no control, occasional cath    Paraplegia [facility])    Tumor T4 @ age 16month had surgery   Pneumonia    Tachycardia        Past Surgical History:  [place]ality Date   [place]  I & D EXTREMITY Left 06/02/2021   Procedure: EXCISION LEFT DISTAL FIBULA WOUND CLOSURE, PIN PLACEMENT TO STABILIZE ANKLE;  Surgeon: DNewt Minion MD;  [place] [facility]  Service: Orthopedics;  Laterality: Left;   I & D EXTREMITY Left 05/04/2022   Procedure: DEBRIDEMENT LEFT ANKLE;  Surgeon: DNewt Minion MD;  [place] [facility]  Service: Orthopedics;  Laterality: Left;   SPINAL FUSION  1993   SPINE SURGERY     784monthand 6 years    Social History:  Ambulatory  wheelchair bound,      reports that he has quit smoking. He has never used smokeless tobacco. He reports current alcohol use of about 2.0 standard drinks of alcohol per week. He reports current drug use. Drug: Marijuana.   Family History:   Family History  Problem Relation Age of Onset   Diabetes Father    Colon cancer Neg Hx    Prostate cancer Neg Hx    CAD Neg Hx    __________ Allergies: No Known Allergies   Prior to Admission medications   Medication Sig Start Date End Date Taking? Authorizing Provider  AMBULATORY NON FORMULARY MEDICATION ExSystems analystor Wheelchair. 11/23/10   PaColon BranchMD  ciprofloxacin (CIPRO) 500 MG tablet Take 1 tablet (500 mg total) by mouth 2 (two) times daily. 05/06/22   DuNewt MinionMD  cyanocobalamin (VITAMIN B12) 1000 MCG tablet Take 1,000 mcg by mouth 3 (three) times a week.    [provider]  doxycycline (VIBRA-TABS) 100 MG tablet Take 1 tablet (100 mg total) by mouth 2 (two) times daily. 05/05/22   DuNewt MinionMD  ferrous sulfate 325 (65 FE) MG tablet Take 1 tablet (325 mg total) by mouth daily. Patient taking differently: Take 325 mg by mouth 3 (three) times a week. 06/05/21 06/05/22  AdShelly CossMD  guaiFENesin (MUCINEX) 600 MG 12 hr tablet Take  600 mg by mouth 2 (two) times daily as needed for cough (congestion).    [provider]  Misc. Devices ([facility]) MISC Use as directed with wheelchair. 12/25/11   PaColon BranchMD    ___________________________________________________________________________________________________ Physical Exam:    05/05/2022   11:00 AM 05/05/2022   10:00 AM 05/05/2022    7:51 AM  Vitals with BMI  Systolic [id number]  Diastolic 65  53  Pulse [id number] 129     1. General:  in No  Acute distress   Chronically ill-appearing 2. Psychological: Alert and  Oriented 3. Head/ENT:   Dry Mucous Membranes                          Head Non traumatic, neck supple                          Poor Dentition 4. SKIN:  decreased Skin turgor,  Skin clean  Dry multiple wounds noted     5. Heart: Regular rate and rhythm no  Murmur, no Rub or gallop 6. Lungs:  , no wheezes or crackles   7. Abdomen: Soft,  non-tender, Non distended  bowel sounds present 8. Lower extremities: no clubbing, cyanosis, no  edema 9. Neurologically t4 paraplegic 10. MSK: Normal range of motion    Chart has been reviewed  ______________________________________________________________________________________________  Assessment/Plan  37 y.o. 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    Admitted for osteomyelitis   Present on Admission:  Osteomyelitis ([place])  Paraplegia ([place])  Microcytic anemia  Pressure injury of skin  Hypokalemia     Osteomyelitis (HCC) Discussed case with Dr. Sharol Given he will not be available until MOnday  Rec consult to plastics to see if able to evaluate pt Continue zosyn and vanc for now  ID consult (sent msg)  Paraplegia ([place]) Chronic resulting in multiple complication including  neurogenic bladder chronic tachycardia  Microcytic anemia Obtain anemia panel Transfuse as needed for hemoglobin approaching 7 or below  Pressure injury of skin Will likely need complex care requiring plastic surgery consult as well as orthopedics as well as ID  Hypokalemia Will replace and check mg level    Other plan as per orders.  DVT prophylaxis:  SCD      Code Status:    Code Status: Prior FULL CODE  as per patient   I had personally discussed CODE STATUS with patient     Family Communication:   Family not at  Bedside    Disposition Plan:     likely will need placement for rehabilitation                             Following barriers for discharge:                                                        Will need consultants to evaluate patient prior to discharge                     Transition of care consulted                   Nutrition    consulted                  Wound care  consulted                     Consults called: Orthopedics is aware but Dr. Sharol Given will not be available until Monday, will need plastics consult in AM  ID msg has been sent  Admission status:  ED Disposition     None        inpatient     I Expect 2 midnight stay secondary to severity of patient's current illness need for inpatient interventions justified by the following:  hemodynamic instability despite optimal treatment (tachycardia  )  Severe lab/radiological/exam abnormalities including:     and extensive comorbidities including: paraplegia  That are currently affecting medical management.   I expect  patient to be hospitalized for 2 midnights requiring inpatient  medical care.  Patient is at high risk for adverse outcome (such as loss of life or disability) if not treated.  Indication for inpatient stay as follows:   Hemodynamic instability despite maximal medical therapy,    Need for operative/procedural  intervention    Need for IV antibiotics, IV fluids,     Level  of care      medical floor    Brentt Fread 05/11/2022, 12:48 AM    [facility]   after 2 AM please page floor coverage PA If 7AM-7PM, please contact the day team taking care of the patient using [email or web address]   Patient was evaluated in the context of the global COVID-19 pandemic, which necessitated consideration that the patient might be at risk for infection with the SARS-CoV-2 virus that causes COVID-19. Institutional protocols and algorithms that pertain to the evaluation of patients at risk for COVID-19 are in a state of rapid change based on information released by regulatory bodies including the CDC and federal and state organizations. These policies and algorithms were followed during the patient's care.

## 2022-05-10 NOTE — Progress Notes (Signed)
Pharmacy Antibiotic Note  Lee Reilly is a 37 y.o. male admitted on 05/10/2022 with  wound infection/osteo .  Pharmacy has been consulted for Vanc and Zosyn dosing.  Vancomycin 1000 mg IV Q 8 hrs. Goal AUC 400-550. Expected AUC: 476 SCr used: 0.41  Plan: Zosyn 3.375g IV q8h (4 hour infusion). Vanc 1000 mg IV q8hr Monitor renal function, clinical status and vanc levels as needed    No data recorded.  Recent Labs  Lab 05/04/22 1303  WBC 24.8*  CREATININE 0.41*    Estimated Creatinine Clearance: 92.4 mL/min (A) (by C-G formula based on SCr of 0.41 mg/dL (L)).    No Known Allergies  Thank you for allowing pharmacy to be a part of this patient's care.  Alanda Slim, PharmD, [facility] Clinical Pharmacist Please see AMION for all Pharmacists' Contact Phone Numbers 05/10/2022, 9:38 PM

## 2022-05-10 NOTE — Plan of Care (Signed)
Plan of Care Note for accepted transfer   Patient: Lee Reilly MRN: [id number]   DOA: (Not on file)  Facility requesting transfer: [facility] ER Requesting Provider: Dr. 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   Notably, patient also has a chronic left hip wound, recent left ankle I&D (05/04/22) for OM and ulcer and discharged on 05/05/22 with 30 day course of doxycycline due to negative culture. However, on 05/07/22 he was prescribed a 20 day course of cipro as cultures returned with (providencia stuartii, pseudomonas, and bacteroides).   He presented to his PCP office on 2/26 for follow up and WBC noted to be 22.7; (of note, WBC on 2/23 was 24.8) and WBC on 2/29 at [facility] ER noted to be 16.7. He was referred to wound care outpatient, wound cultures were obtained (reportedly positive but cannot see organisms), and imaging ordered via xray of B/L hips. Appears he then presented to [place] ER due to positive wound cultures and the aforementioned leukocytosis.   He underwent MRI left hip which reveled multiple findings (cannot see all in careeverywhere currently), but notably has subcutaneous abscess involving perineum, inner thigh. Wound noted in lateral hip measuring 4.6 x 5.3 cm with cellulitis involving gluteal muscle and OM of greater trochanter. Case was discussed with Dr. Sharol Given who recommended transfer to [facility] for multi-disciplinary approach to likely include ortho, general surgery, ID, and possibly plastic surgery.  He was treated with vanc and zosyn.   Other notable labs incude:  ESR 52 (prev 32 on 2/26). CRP 155 (prev 178 on 2/26) WBC 16.7, Hgb 7.8 g/dL, PLTC 572  Recent vitals: 114/57, 98.5, HR  121, RR 18, 98% RA.  Will need to consult Dr. Sharol Given and ID on arrival and await further recommendations from Dr. Sharol Given.   Plan of care: The patient is accepted for admission to [place]  unit, at [facility].  Author: Dwyane Dee, MD 05/10/2022  Check [email or web address] for on-call coverage.  Nursing staff, Please call [place] number on Amion as soon as patient's arrival, so appropriate admitting provider can evaluate the pt.

## 2022-05-10 NOTE — Subjective & Objective (Signed)
Patient with known history of T4 paraplegia secondary to infancy neuroblastoma wheelchair-bound at baseline with chronic left hip wound recent left ankle I&D (05/04/22) for OM and ulcer and discharged on 05/05/22 with 30 day course of doxycycline due to negative culture. However, on 05/07/22 he was prescribed a 20 day course of cipro as cultures returned with (providencia stuartii, pseudomonas, and bacteroides).    He presented to his PCP office on 2/26 for follow up and WBC noted to be 22.7; (up from 2/23 was 24.8) and WBC on 2/29 at [place] ER noted to be 16.7. He was referred to wound care outpatient, wound cultures were obtained (reportedly positive but cannot see organisms), and imaging ordered via xray of B/L hips. Appears he then presented to [facility] ER due to positive wound cultures and leukocytosis.    MRI left hip which reveled multiple findings subcutaneous abscess involving perineum, inner thigh. Wound noted in lateral hip measuring 4.6 x 5.3 cm with cellulitis involving gluteal muscle and osteomyelitis of greater trochanter. Case was discussed with Dr. Sharol Given who recommended transfer to [facility] for multi-disciplinary approach to likely include ortho, general surgery, ID, and possibly plastic surgery.  He was started on vancomycin and Zosyn CRP noted to be elevated at 155 which is actually slightly down from 178 on 26 February Hemoglobin 7.8 Patient tachycardic up to 121

## 2022-05-10 NOTE — Assessment & Plan Note (Signed)
Discussed case with Dr. Sharol Given he will not be available until MOnday  Rec consult to plastics to see if able to evaluate pt Continue zosyn and vanc for now  ID consult (sent msg)

## 2022-05-10 NOTE — Assessment & Plan Note (Signed)
Chronic resulting in multiple complication including neurogenic bladder chronic tachycardia

## 2022-05-10 NOTE — Assessment & Plan Note (Signed)
Obtain anemia panel Transfuse as needed for hemoglobin approaching 7 or below

## 2022-05-11 ENCOUNTER — Inpatient Hospital Stay: Payer: Self-pay

## 2022-05-11 ENCOUNTER — Encounter (HOSPITAL_COMMUNITY): Payer: Self-pay | Admitting: Internal Medicine

## 2022-05-11 DIAGNOSIS — K610 Anal abscess: Secondary | ICD-10-CM | POA: Diagnosis not present

## 2022-05-11 DIAGNOSIS — L02416 Cutaneous abscess of left lower limb: Secondary | ICD-10-CM | POA: Diagnosis not present

## 2022-05-11 DIAGNOSIS — M869 Osteomyelitis, unspecified: Secondary | ICD-10-CM | POA: Diagnosis not present

## 2022-05-11 DIAGNOSIS — K651 Peritoneal abscess: Secondary | ICD-10-CM | POA: Diagnosis not present

## 2022-05-11 DIAGNOSIS — D62 Acute posthemorrhagic anemia: Secondary | ICD-10-CM | POA: Diagnosis not present

## 2022-05-11 DIAGNOSIS — E43 Unspecified severe protein-calorie malnutrition: Secondary | ICD-10-CM | POA: Diagnosis not present

## 2022-05-11 DIAGNOSIS — M86152 Other acute osteomyelitis, left femur: Secondary | ICD-10-CM | POA: Diagnosis not present

## 2022-05-11 LAB — COMPREHENSIVE METABOLIC PANEL
ALT: 26 U/L (ref 0–44)
AST: 26 U/L (ref 15–41)
Albumin: <1.5 g/dL — ABNORMAL LOW (ref 3.5–5.0)
Alkaline Phosphatase: 86 U/L (ref 38–126)
Anion gap: 7 (ref 5–15)
BUN: <5 mg/dL — ABNORMAL LOW (ref 6–20)
CO2: 19 mmol/L — ABNORMAL LOW (ref 22–32)
Calcium: 7.8 mg/dL — ABNORMAL LOW (ref 8.9–10.3)
Chloride: 109 mmol/L (ref 98–111)
Creatinine, Ser: 0.4 mg/dL — ABNORMAL LOW (ref 0.61–1.24)
GFR, Estimated: >60 mL/min (ref >60)
Glucose, Bld: 84 mg/dL (ref 70–99)
Potassium: 3.5 mmol/L (ref 3.5–5.1)
Sodium: 135 mmol/L (ref 135–145)
Total Bilirubin: 0.8 mg/dL (ref 0.3–1.2)
Total Protein: 7.3 g/dL (ref 6.5–8.1)

## 2022-05-11 LAB — SEDIMENTATION RATE: Sed Rate: 138 mm/hr — ABNORMAL HIGH (ref 0–16)

## 2022-05-11 LAB — CBC
HCT: 24.8 % — ABNORMAL LOW (ref 39.0–52.0)
Hemoglobin: 7.2 g/dL — ABNORMAL LOW (ref 13.0–17.0)
MCH: 22 pg — ABNORMAL LOW (ref 26.0–34.0)
MCHC: 29 g/dL — ABNORMAL LOW (ref 30.0–36.0)
MCV: 75.6 fL — ABNORMAL LOW (ref 80.0–100.0)
Platelets: 493 10*3/uL — ABNORMAL HIGH (ref 150–400)
RBC: 3.28 MIL/uL — ABNORMAL LOW (ref 4.22–5.81)
RDW: 18 % — ABNORMAL HIGH (ref 11.5–15.5)
WBC: 16 10*3/uL — ABNORMAL HIGH (ref 4.0–10.5)
nRBC: 0 % (ref 0.0–0.2)

## 2022-05-11 LAB — MRSA NEXT GEN BY PCR, NASAL: MRSA by PCR Next Gen: NOT DETECTED

## 2022-05-11 LAB — C-REACTIVE PROTEIN: CRP: 15.1 mg/dL — ABNORMAL HIGH (ref <1.0)

## 2022-05-11 MED ORDER — POTASSIUM CHLORIDE 10 MEQ/100ML IV SOLN
10.0000 meq | INTRAVENOUS | Status: AC
  Administered 2022-05-11 (×4): 10 meq via INTRAVENOUS
  Filled 2022-05-11 (×3): qty 100

## 2022-05-11 MED ORDER — MUPIROCIN 2 % EX OINT
TOPICAL_OINTMENT | Freq: Two times a day (BID) | CUTANEOUS | Status: DC
  Filled 2022-05-11 (×4): qty 22

## 2022-05-11 MED ORDER — METRONIDAZOLE 500 MG PO TABS
500.0000 mg | ORAL_TABLET | Freq: Two times a day (BID) | ORAL | Status: DC
  Administered 2022-05-11: 500 mg via ORAL
  Filled 2022-05-11: qty 1

## 2022-05-11 MED ORDER — CHLORHEXIDINE GLUCONATE CLOTH 2 % EX PADS
6.0000 | MEDICATED_PAD | Freq: Every day | CUTANEOUS | Status: DC
  Administered 2022-05-11 – 2022-05-18 (×8): 6 via TOPICAL

## 2022-05-11 MED ORDER — SODIUM CHLORIDE 0.9 % IV SOLN: INTRAVENOUS | Status: DC

## 2022-05-11 MED ORDER — VANCOMYCIN HCL 750 MG/150ML IV SOLN
750.0000 mg | Freq: Two times a day (BID) | INTRAVENOUS | Status: DC
  Filled 2022-05-11: qty 150

## 2022-05-11 MED ORDER — SODIUM CHLORIDE 0.9 % IV SOLN
2.0000 g | Freq: Three times a day (TID) | INTRAVENOUS | Status: DC
  Administered 2022-05-11: 2 g via INTRAVENOUS
  Filled 2022-05-11: qty 12.5

## 2022-05-11 MED ORDER — SODIUM CHLORIDE 0.9% FLUSH: 10.0000 mL | INTRAVENOUS | Status: DC | PRN

## 2022-05-11 MED ORDER — POTASSIUM CHLORIDE 10 MEQ/100ML IV SOLN
10.0000 meq | INTRAVENOUS | Status: DC
  Administered 2022-05-11 (×3): 10 meq via INTRAVENOUS
  Filled 2022-05-11 (×3): qty 100

## 2022-05-11 MED ORDER — DAKINS (1/4 STRENGTH) 0.125 % EX SOLN
Freq: Two times a day (BID) | CUTANEOUS | Status: AC
  Filled 2022-05-11 (×2): qty 473

## 2022-05-11 MED ORDER — POTASSIUM CHLORIDE CRYS ER 20 MEQ PO TBCR
40.0000 meq | EXTENDED_RELEASE_TABLET | Freq: Once | ORAL | Status: AC
  Administered 2022-05-11: 40 meq via ORAL
  Filled 2022-05-11: qty 2

## 2022-05-11 NOTE — Progress Notes (Signed)
Pharmacy Antibiotic Note  Lee Reilly is a 37 y.o. male admitted on 05/10/2022 with  wound infection/osteo .  Pharmacy has been consulted for Vancomycin dosing. Noted patient is paraplegic so SCr likely not reflective of true clearance. Recent cultures from calf showed providencia stuartii, pseudomonas and bacteroides.   SCr today 0.40.  Plan: Adjust Vancomycin to 750 mg Q 12 hours  Predicted AUC 492 with SCr 0.8.  Monitor renal function, clinical status and vanc levels as needed Monitor surgical plans     Temp (24hrs), Avg:98.3 F (36.8 C), Min:97.5 F (36.4 C), Max:98.8 F (37.1 C)  Recent Labs  Lab 05/04/22 1303 05/10/22 2203 05/11/22 0931  WBC 24.8* 16.5* 16.0*  CREATININE 0.41* 0.39* 0.40*     Estimated Creatinine Clearance: 92.4 mL/min (A) (by C-G formula based on SCr of 0.4 mg/dL (L)).    No Known Allergies  Thank you for allowing pharmacy to be a part of this patient's care.  Jimmy Footman, PharmD, BCPS, [facility] Clinical Pharmacist Phone: [phone] 05/11/2022, 11:00 AM

## 2022-05-11 NOTE — Evaluation (Signed)
Occupational Therapy Evaluation Patient Details Name: Lee Reilly MRN: [id number] DOB: 1986/03/07 Today's Date: 05/11/2022   History of Present Illness Pt is a 37 y.o. male s/p I&D L ankle ulcer 2/23 and then d/c home with wound vac. 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   Clinical Impression   Pt admitted with the above diagnosis and has the deficits outlined below. Pt would benefit from further OT combined with nursing care to address skin care issues, bowel and bladder issues  and preventative measures pt can do at home to take care of skin. Pt in last week for surgery to wound on L ankle. Pt back in this week to address large wound on L hip. Pt only can sleep on his left side due to scoliosis and does not sleep on his back. Feel this is contributing to his wound issues on the Left. Pt also sleeps in a regular bed with regular mattress. Pt needs to consider a pressure relieving air mattress for home and needs one in hospital as well.  Pt has never been on a bowel or bladder program. Pt just goes whenever in his brief and cleans himself up when he notices.  Therapist feels stool and urine may be getting into pt's wound.  Feel pt may benefit from being on a regimented bowel and bladder program where he makes himself empty his bowels at specific times during the day and it may help to in and out cath on a schedule so he is not sitting in his urine which breaks down his skin.  Will work with pt on how to do skin checks and have spoken to nursing about skin care needs.      Recommendations for follow up therapy are one component of a multi-disciplinary discharge planning process, led by the attending physician.  Recommendations may be updated based on patient status,  additional functional criteria and insurance authorization.   Follow Up Recommendations  Home health OT     Assistance Recommended at Discharge Intermittent Supervision/Assistance  Patient can return home with the following Other (comment) (assist with skin care, bowel, bladder and toileting routine)    Functional Status Assessment  Patient has had a recent decline in their functional status and demonstrates the ability to make significant improvements in function in a reasonable and predictable amount of time.  Equipment Recommendations  Hospital bed;Other (comment) (mattress for pressure ulcers)    Recommendations for Other Services       Precautions / Restrictions Precautions Precautions: Other (comment) Precaution Comments: wound vac L foot, large ulcer L hip, Low BP and increased HR during session.      Mobility Bed Mobility Overal bed mobility: Modified Independent             General bed mobility comments: Pt has regular bed at home.  Due to skin issues, need to consider different mattress system at home if possible.    Transfers Overall transfer level: Modified independent Equipment used: None               General transfer comment: lateral scoot vs A/P transfer in/out of w/c      Balance Overall balance assessment: Modified Independent  ADL either performed or assessed with clinical judgement   ADL Overall ADL's : At baseline                                       General ADL Comments: Pt at baseline but feel pt's baseline is not adequate for taking care of his bowel, bladder and skin care.     Vision Baseline Vision/History: 0 No visual deficits Ability to See in Adequate Light: 0 Adequate Patient Visual Report: No change from baseline Vision Assessment?: No apparent visual deficits     Perception     Praxis Praxis Praxis tested?: Within functional limits     Pertinent Vitals/Pain Pain Assessment Pain Assessment: No/denies pain     Hand Dominance Right   Extremity/Trunk Assessment Upper Extremity Assessment Upper Extremity Assessment: Overall WFL for tasks assessed   Lower Extremity Assessment Lower Extremity Assessment: Defer to PT evaluation RLE Deficits / Details: T4 complete paraplegic LLE Deficits / Details: T4 complete paraplegic   Cervical / Trunk Assessment Cervical / Trunk Assessment: Back Surgery;Other exceptions (soliosis)   Communication Communication Communication: No difficulties   Cognition Arousal/Alertness: Awake/alert Behavior During Therapy: WFL for tasks assessed/performed Overall Cognitive Status: Within Functional Limits for tasks assessed                                 General Comments: Pt anxious about being in the hospital. Feel pt has not had nursing care that is needed for T4 para to address bowel, bladder and skin.     General Comments  Pt with large wound on L hip and one on L ankle that pt had surgery for last week.  Pt only sleeps on L side at home due to back issues. Feel new mattress is necessary at home.    Exercises     Shoulder Instructions      Home Living Family/patient expects to be discharged to:: Private residence Living Arrangements: Alone Available Help at Discharge: Family;Available 24 hours/day Type of Home: House Home Access: Level entry     Home Layout: One level     Bathroom Shower/Tub: Tub/shower unit;Curtain   Biochemist, clinical: Standard Bathroom Accessibility: Yes How Accessible: Accessible via wheelchair Home Equipment: Tub bench;Grab bars - tub/shower;Wheelchair - manual   Additional Comments: Pt reports he has a new w/c ordered.      Prior Functioning/Environment Prior Level of Function : Independent/Modified Independent;Driving;Working/employed             Mobility Comments: mod I at manual w/c level ADLs Comments: Uses briefs for BM's  and urine. 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 Pt with large wound on L hip.  Feel pt may need to look at different bowel and bladder regime moving forward. Feel urine and stool may be getting into the wound.  Pt is a candidate for a bowel and bladder program over just doing this in his brief and changing himself when he becomes aware.  Pt also needs a more appropriate mattress for his bed at home as pt only sleeps on his L side due to scoliosis and this is where the wound is. Need to also look at cushion in chair.        OT Problem List: Decreased knowledge of use of DME or AE;Decreased knowledge of  precautions;Other (comment) (inablility to care for skin, bowel and bladder)      OT Treatment/Interventions: Self-care/ADL training    OT Goals(Current goals can be found in the care plan section) Acute Rehab OT Goals Patient Stated Goal: to get this under control with my skin OT Goal Formulation: With patient Time For Goal Achievement: 05/25/22 Potential to Achieve Goals: Good ADL Goals Additional ADL Goal #1: Pt will demonstrate ability to check his skin from head to toe in bed with skin check mirror independently. Additional ADL Goal #2: Pt with nursing and OT will discuss and speak of advantages of getting pt on a bowel and bladder program which will in turn assist with pts skin care. Additional ADL Goal #3: Pt will state 3 reasons why it is important to check his skin head to toe at least two times daily.  OT Frequency: Min 2X/week    Co-evaluation              AM-PAC OT "6 Clicks" Daily Activity     Outcome Measure Help from another person eating meals?: None Help from another person taking care of personal grooming?: None Help from another person toileting, which includes using toliet, bedpan, or urinal?: Total Help from another person bathing (including washing, rinsing, drying)?: None Help from another person to put on and  taking off regular upper body clothing?: None Help from another person to put on and taking off regular lower body clothing?: None 6 Click Score: 21   End of Session Nurse Communication: Mobility status;Other (comment) (bowel needs, bladder needs, skin care needs, air mattress needed.)  Activity Tolerance: Patient tolerated treatment well Patient left: in bed;with call bell/phone within reach  OT Visit Diagnosis: Other (comment) (skin care, bowel and bladder needs)                Time: 0823-0920 OT Time Calculation (min): 57 min Charges:  OT General Charges $OT Visit: 1 Visit OT Evaluation $OT Eval High Complexity: 1 High OT Treatments $Self Care/Home Management : 23-37 mins  Glenford Peers 05/11/2022, 9:40 AM

## 2022-05-11 NOTE — Progress Notes (Signed)
[facility] PROGRESS NOTE   Lee Reilly [id number] DOB: [birth year] DOA: 05/10/2022  PCP: Patient, No Pcp Per  Brief History/Interval Summary: 37 y.o. 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 Wheelchair-bound at baseline with chronic left hip wound. Recent left ankle I&D (05/04/22) for OM and ulcer and discharged on 05/05/22 with 30 day course of doxycycline due to negative culture. However, on 05/07/22 he was prescribed a 20 day course of cipro as cultures returned with (providencia stuartii, pseudomonas, and bacteroides).   He presented to his PCP office on 2/26 for follow up and WBC noted to be 22.7; (up from 2/23 was 24.8) and WBC on 2/29 at [place] [place] [facility] noted to be 16.7. He was referred to wound care outpatient, wound cultures were obtained (reportedly positive but cannot see organisms), and imaging ordered via xray of B/L hips. Appears he then presented to [place] [facility] due to positive wound cultures and leukocytosis.   MRI left hip which reveled multiple findings subcutaneous abscess involving perineum, inner thigh. Wound noted in lateral hip measuring 4.6 x 5.3 cm with cellulitis involving gluteal muscle and osteomyelitis of greater trochanter. Case was discussed with Dr. Sharol Given who recommended transfer to [facility] for multi-disciplinary approach to likely include ortho, general surgery, ID, and possibly plastic surgery. He was started on vancomycin and Zosyn. CRP noted to be elevated at 155 which is actually slightly down from 178 on 26 February.   Consultants: Orthopedics.  General surgery.  Infectious disease.  Procedures: None yet    Subjective/Interval History: Patient denies any abdominal pain back pain or pain in his lower extremities.  No shortness of  breath.    Assessment/Plan:  Osteomyelitis of the greater trochanter/left hip open wound with draining abscess/myositis around the left hip Orthopedics to consult.  Patient started on broad-spectrum antibiotics with vancomycin and Zosyn.  Infectious disease has been consulted. WBC noted to be significantly elevated but patient is afebrile. Borderline low blood pressures noted but patient is asymptomatic.  Perineal abscess along with the left perianal abscess Incidentally noted on MRI done in outside facility.  Will involve general surgery.  Antibiotics as mentioned above.  Left ankle wound Cultures positive for [facility], Pseudomonas and Bacteroides.  Patient was on ciprofloxacin prior to admission.  Currently on vancomycin and Zosyn.  ID to weigh in.  Currently has a wound VAC.  Underwent surgical intervention about a week ago.  Left kidney staghorn calculus with hydroureteronephrosis Renal function is normal.  Will likely need to be addressed by urology in the outpatient setting.  Hypokalemia Was repleted.  Await labs from today.  Microcytic anemia Anemia panel reviewed.  No deficiencies identified.  Could be due to chronic disease.  Transfuse if hemoglobin drops below 7.  Paraplegia This is chronic.  Also has a history of neurogenic bladder.  DVT Prophylaxis: SCDs Code Status: Full code Family Communication: Discussed with patient Disposition Plan: To be determined  Status is: Inpatient Remains inpatient appropriate because: Multiple abscesses, osteomyelitis      Medications: Scheduled:  dextromethorphan-guaiFENesin  1 tablet Oral BID   metroNIDAZOLE  500 mg Oral Q12H   mupirocin ointment   Topical BID   sodium hypochlorite   Irrigation BID   Continuous:  ceFEPime (MAXIPIME) IV 2 g (05/11/22 0952)   vancomycin 1,000 mg (05/11/22 0615)   [id number] **OR** acetaminophen, HYDROcodone-acetaminophen  Antibiotics:  Anti-infectives (From admission, onward)     Start     Dose/Rate Route Frequency Ordered Stop   05/11/22 1000  ceFEPIme (MAXIPIME) 2 g in sodium chloride 0.9 % 100 mL IVPB        2 g 200 mL/hr over 30 Minutes Intravenous Every 8 hours 05/11/22 0904     05/11/22 1000  metroNIDAZOLE (FLAGYL) tablet 500 mg        500 mg Oral Every 12 hours 05/11/22 0904     05/10/22 2230  piperacillin-tazobactam (ZOSYN) IVPB 3.375 g  Status:  Discontinued        3.375 g 12.5 mL/hr over 240 Minutes Intravenous Every 8 hours 05/10/22 2134 05/11/22 0904   05/10/22 2230  vancomycin (VANCOCIN) IVPB 1000 mg/200 mL premix        1,000 mg 200 mL/hr over 60 Minutes Intravenous Every 8 hours 05/10/22 2136         Objective:  Vital Signs  Vitals:   05/11/22 0333 05/11/22 0438 05/11/22 0631 05/11/22 0915  BP: (!) 99/49  (!) 93/52 (!) 99/52  Pulse: (!) 112  90 100  Resp: '17  16 18  '$ Temp: 98.8 F (37.1 C)  98.2 F (36.8 C) (!) 97.5 F (36.4 C)  TempSrc: Oral Oral Oral Oral  SpO2: (!) 83%  100% 100%   No intake or output data in the 24 hours ending 05/11/22 1016 There were no vitals filed for this visit.  General appearance: Awake alert.  In no distress Resp: Clear to auscultation bilaterally.  Normal effort Cardio: S1-S2 is normal regular.  No S3-S4.  No rubs murmurs or bruit GI: Abdomen is soft.  Nontender nondistended.  Bowel sounds are present normal.  No masses organomegaly Dressing noted over the left hip area.   Wound VAC noted over the left ankle. Paraplegic.   Lab Results:  Data Reviewed: I have personally reviewed following labs and reports of the imaging studies  CBC: Recent Labs  Lab 05/04/22 1303 05/10/22 2203 05/11/22 0931  WBC 24.8* 16.5* 16.0*  NEUTROABS  --  13.6*  --   HGB 7.8* 7.4* 7.2*  HCT 27.8* 25.2* 24.8*  MCV 76.8* 74.8* 75.6*  PLT 542* 550* 493*    Basic Metabolic Panel: Recent Labs  Lab 05/04/22 1303 05/10/22 2203  NA 134* 139  K 3.3* 3.1*  CL 101 110  CO2 22 21*  GLUCOSE 85 97  BUN 6 7   CREATININE 0.41* 0.39*  CALCIUM 8.0* 8.0*  MG  --  1.7  PHOS  --  2.6    GFR: Estimated Creatinine Clearance: 92.4 mL/min (A) (by C-G formula based on SCr of 0.39 mg/dL (L)).  Liver Function Tests: Recent Labs  Lab 05/10/22 2203  AST 36  ALT 30  ALKPHOS 88  BILITOT 0.4  PROT 7.7  ALBUMIN <1.5*     Coagulation Profile: Recent Labs  Lab 05/10/22 2203  INR 1.2     Thyroid Function Tests: Recent Labs    05/10/22 2203  TSH 1.689    Anemia Panel: Recent Labs    05/10/22 2203  VITAMINB12 825  FOLATE 7.6  FERRITIN 240  TIBC 115*  IRON 15*  RETICCTPCT 1.9    Recent Results (from the past 240 hour(s))  Aerobic/Anaerobic Culture w Gram Stain (surgical/deep wound)     Status: None   Collection Time: 05/04/22  2:05 PM   Specimen: Soft Tissue, Other  Result Value Ref Range Status   Specimen Description TISSUE LEFT ANKLE  Final   Special Requests NONE  Final   Gram Stain NO WBC SEEN NO ORGANISMS SEEN   Final   Culture   Final    RARE PROVIDENCIA STUARTII RARE PSEUDOMONAS AERUGINOSA RARE BACTEROIDES FRAGILIS BETA LACTAMASE NEGATIVE Performed at [facility], [address], [place], [place] [address]    Report Status 05/07/2022 FINAL  Final   Organism ID, Bacteria PROVIDENCIA STUARTII  Final   Organism ID, Bacteria PSEUDOMONAS AERUGINOSA  Final      Susceptibility   Pseudomonas aeruginosa - MIC*    CEFTAZIDIME 4 SENSITIVE Sensitive     CIPROFLOXACIN <=0.25 SENSITIVE Sensitive     GENTAMICIN <=1 SENSITIVE Sensitive     IMIPENEM 2 SENSITIVE Sensitive     PIP/TAZO 8 SENSITIVE Sensitive     CEFEPIME 2 SENSITIVE Sensitive     * RARE PSEUDOMONAS AERUGINOSA   Providencia stuartii - MIC*    AMPICILLIN RESISTANT Resistant     CEFEPIME <=0.12 SENSITIVE Sensitive     CEFTAZIDIME <=1 SENSITIVE Sensitive     CEFTRIAXONE <=0.25 SENSITIVE Sensitive     CIPROFLOXACIN <=0.25 SENSITIVE Sensitive     GENTAMICIN RESISTANT Resistant     IMIPENEM 2 SENSITIVE  Sensitive     TRIMETH/SULFA <=20 SENSITIVE Sensitive     AMPICILLIN/SULBACTAM 16 INTERMEDIATE Intermediate     PIP/TAZO <=4 SENSITIVE Sensitive     * RARE PROVIDENCIA STUARTII  MRSA Next Gen by PCR, Nasal     Status: None   Collection Time: 05/10/22 11:15 PM   Specimen: Nasal Mucosa; Nasal Swab  Result Value Ref Range Status   MRSA by PCR Next Gen NOT DETECTED NOT DETECTED Final    Comment: (NOTE) The GeneXpert MRSA Assay (FDA approved for NASAL specimens only), is one component of a comprehensive MRSA colonization surveillance program. It is not intended to diagnose MRSA infection nor to guide or monitor treatment for MRSA infections. Test performance is not FDA approved in patients less than 36 years old. Performed at [facility], [place] [address] [place], [place] [address]       Radiology Studies: [place] EKG SITE RITE  Result Date: 05/11/2022 If Site Rite image not attached, placement could not be confirmed due to current cardiac rhythm.      LOS: 1 day   Kendrick Remigio [employer] on [email or web address]  05/11/2022, 10:16 AM

## 2022-05-11 NOTE — Progress Notes (Signed)
Peripherally Inserted Central Catheter Placement  The IV Nurse has discussed with the patient and/or persons authorized to consent for the patient, the purpose of this procedure and the potential benefits and risks involved with this procedure.  The benefits include less needle sticks, lab draws from the catheter, and the patient may be discharged home with the catheter. Risks include, but not limited to, infection, bleeding, blood clot (thrombus formation), and puncture of an artery; nerve damage and irregular heartbeat and possibility to perform a PICC exchange if needed/ordered by physician.  Alternatives to this procedure were also discussed.  Bard Power PICC patient education guide, fact sheet on infection prevention and patient information card has been provided to patient /or left at bedside.    PICC Placement Documentation  PICC Single Lumen [id number] Right Basilic 38 cm 0 cm (Active)  Indication for Insertion or Continuance of Line Limited venous access - need for IV therapy >5 days (PICC only) 05/11/22 1304  Exposed Catheter (cm) 0 cm 05/11/22 1304  Site Assessment Clean, Dry, Intact 05/11/22 1304  Line Status Flushed;Saline locked;Blood return noted 05/11/22 1304  Dressing Type Transparent;Securing device 05/11/22 1304  Dressing Status Antimicrobial disc in place 05/11/22 1304  Dressing Intervention New dressing;Other (Comment) 05/11/22 1304  Dressing Change Due 05/18/22 05/11/22 1304       Christella Noa Albarece 05/11/2022, 1:06 PM

## 2022-05-11 NOTE — Consult Note (Signed)
[facility] for Infectious Disease    Date of Admission:  05/10/2022     Total days of antibiotics 2  Vancomycin 2/29   Zosyn 2/29              Reason for Consult: Pelvic osteomyelitis, chronic wound     Referring Provider: [place] Pink  Primary Care Provider: Patient, No Pcp Per    Assessment: Lee Reilly is a 37 y.o. 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 Went to see his PCP a few days ago where MRI was arranged as well as blood work. Leukocytosis, elevated inflammatory markers observed. MRI read as mentioned below. Advised to present to ER for IV antibiotics and admission.   Largely has been feeling well on current antibiotic regimen since his left foot debridement   Baseline CRP 15.8 mg/dL, ESR 52 WBC 24.8 >> 16.5 --> appears it has been chronically elevated since May 2023 from the spot checks I see. Last normal value 7 years ago.   Ortho mentions pelvic abscess, but not sure it is intra-abdominal based on report of MRI in H&P - not able to formally see MRI read/imaging myself. MRI left hip which reveled multiple findings subcutaneous abscess involving perineum, inner thigh. Wound noted in lateral hip measuring 4.6 x 5.3 cm with cellulitis involving gluteal muscle and osteomyelitis of greater trochanter. IF there is something to drain would be helpful to guide antimicrobials.   Ortho mentions pelvic abscess, but not sure it is intra-abdominal based on report of MRI in H&P - not able to formally see MRI read/imaging myself. MRI left hip which reveled multiple findings subcutaneous abscess involving perineum, inner thigh. Wound noted in lateral hip measuring 4.6 x 5.3 cm with cellulitis involving gluteal muscle and osteomyelitis of greater trochanter.  General surgery review pending. ?hydrotherapy candidate if not operative candidate. Dr. Sharol Given is also planning on seeing him over the weekend  vs Monday.   He is asking for central access - OK to place PICC line now given no systemic symptoms. Likely low yield of blood cultures at this point after IV abx have been started. With providencia stuartii on left leg culture, cefepime preferred - will adjust antimicrobials to IV vancomycin, cefepime and metronidazole. If no surgery planned ok to give metronidazole PO is fine; otherwise will change to IV over w/e.    Plan: OK to place PICC - d/w attending and VAST FU orthopedic and gen surgery   Continue vancomycin and metronidazole (will need to change the latter to IV if continues to have NPO status after gen surg eval) Change zosyn to cefepime d/t providencia recovered     Principal Problem:   Osteomyelitis ([place]) Active Problems:   Paraplegia (H[facility])   Hypokalemia   Microcytic anemia   Pressure injury of skin    dextromethorphan-guaiFENesin  1 tablet Oral BID   metroNIDAZOLE  500 mg Oral Q12H   mupirocin ointment   Topical BID   sodium hypochlorite   Irrigation BID    HPI: Lee Reilly is a 37 y.o. male admitted from home after outpatient MRI done revealing pelvic osteomyelitis with perineal abscess cavity.   PMHx of anemia, neuroblastoma T4, neurogenic bladder, paraplegia. Chronic wounds.   Mr. Salmeron has been working with Dr. Sharol Given outpatient to address a chronic wound to left ankle. Underwent debridement for this on May 04, 2022. Wound carried down to bone where he  underwent further bone resection; vanc powder placed in the wound and kerecis graft applied. While cultures pending sent home on Doxycycline 100 mg BID. Cultures growing out pseudomonas, providencia stuartii, bacteroides fragilis (-)BL. Ciprofloxacin 500 mg BID was added.   FU on 2/27 with his PCP at [facility] for new wound on left hip - necrosed wound bed concerning for OM. Superficial wound swab obtained as well as XRays. Sent for MRI as well which revealed   He reports feeling generally well, no systemic signs or  symptoms PTA. No blood cultures drawn on admission. Leukocytosis and chronic stable tachycardia. Negative lactate.   Per chart report:  MRI left hip which reveled multiple findings subcutaneous abscess involving perineum, inner thigh. Wound noted in lateral hip measuring 4.6 x 5.3 cm with cellulitis involving gluteal muscle and osteomyelitis of greater trochanter.    Review of Systems: ROS  Past Medical History:  Diagnosis Date   Acne 05/01/2013   Anemia    Asthma    Neuroblastoma ([place])    @ 68 months old    Neurogenic bladder    has no control, occasional cath   Paraplegia [facility])    Tumor T4 @ age 60month had surgery   Pneumonia    Tachycardia     Social History   Tobacco Use   Smoking status: Former   Smokeless tobacco: Never   Tobacco comments:    << 1 ppd   Vaping Use   Vaping Use: Never used  Substance Use Topics   Alcohol use: Yes    Alcohol/week: 2.0 standard drinks of alcohol    Types: 2 Standard drinks or equivalent per week    Comment: socially    Drug use: Yes    Types: Marijuana    Comment: Smokes Marihuana, denies others    Family History  Problem Relation Age of Onset   Diabetes Father    Colon cancer Neg Hx    Prostate cancer Neg Hx    CAD Neg Hx    No Known Allergies  OBJECTIVE: Blood pressure (!) 99/52, pulse 100, temperature (!) 97.5 F (36.4 C), temperature source Oral, resp. rate 18, SpO2 100 %.  Physical Exam  Left hip wound:    Lab Results Lab Results  Component Value Date   WBC 16.0 (H) 05/11/2022   HGB 7.2 (L) 05/11/2022   HCT 24.8 (L) 05/11/2022   MCV 75.6 (L) 05/11/2022   PLT 493 (H) 05/11/2022    Lab Results  Component Value Date   CREATININE 0.40 (L) 05/11/2022   BUN <5 (L) 05/11/2022   NA 135 05/11/2022   K 3.5 05/11/2022   CL 109 05/11/2022   CO2 19 (L) 05/11/2022    Lab Results  Component Value Date   ALT 26 05/11/2022   AST 26 05/11/2022   ALKPHOS 86 05/11/2022   BILITOT 0.8 05/11/2022      Microbiology: Recent Results (from the past 240 hour(s))  Aerobic/Anaerobic Culture w Gram Stain (surgical/deep wound)     Status: None   Collection Time: 05/04/22  2:05 PM   Specimen: Soft Tissue, Other  Result Value Ref Range Status   Specimen Description TISSUE LEFT ANKLE  Final   Special Requests NONE  Final   Gram Stain NO WBC SEEN NO ORGANISMS SEEN   Final   Culture   Final    RARE PROVIDENCIA STUARTII RARE PSEUDOMONAS AERUGINOSA RARE BACTEROIDES FRAGILIS BETA LACTAMASE NEGATIVE Performed at [facility] [address], [place] [address]  Report Status 05/07/2022 FINAL  Final   Organism ID, Bacteria PROVIDENCIA STUARTII  Final   Organism ID, Bacteria PSEUDOMONAS AERUGINOSA  Final      Susceptibility   Pseudomonas aeruginosa - MIC*    CEFTAZIDIME 4 SENSITIVE Sensitive     CIPROFLOXACIN <=0.25 SENSITIVE Sensitive     GENTAMICIN <=1 SENSITIVE Sensitive     IMIPENEM 2 SENSITIVE Sensitive     PIP/TAZO 8 SENSITIVE Sensitive     CEFEPIME 2 SENSITIVE Sensitive     * RARE PSEUDOMONAS AERUGINOSA   Providencia stuartii - MIC*    AMPICILLIN RESISTANT Resistant     CEFEPIME <=0.12 SENSITIVE Sensitive     CEFTAZIDIME <=1 SENSITIVE Sensitive     CEFTRIAXONE <=0.25 SENSITIVE Sensitive     CIPROFLOXACIN <=0.25 SENSITIVE Sensitive     GENTAMICIN RESISTANT Resistant     IMIPENEM 2 SENSITIVE Sensitive     TRIMETH/SULFA <=20 SENSITIVE Sensitive     AMPICILLIN/SULBACTAM 16 INTERMEDIATE Intermediate     PIP/TAZO <=4 SENSITIVE Sensitive     * RARE PROVIDENCIA STUARTII  MRSA Next Gen by PCR, Nasal     Status: None   Collection Time: 05/10/22 11:15 PM   Specimen: Nasal Mucosa; Nasal Swab  Result Value Ref Range Status   MRSA by PCR Next Gen NOT DETECTED NOT DETECTED Final    Comment: (NOTE) The GeneXpert MRSA Assay (FDA approved for NASAL specimens only), is one component of a comprehensive MRSA colonization surveillance program. It is not intended to diagnose MRSA  infection nor to guide or monitor treatment for MRSA infections. Test performance is not FDA approved in patients less than 37 years old. Performed at [facility], [place] [address] [place], [place] [address]      Janene Madeira, MSN, NP-C [facility] for Infectious Disease [email or web address] Pager: [phone] Office: [phone] [facility] Main Line: [place] Communication Welcome

## 2022-05-11 NOTE — Progress Notes (Signed)
PT Cancellation Note  Patient Details Name: Lee Reilly MRN: [id number] DOB: 18-Sep-1985   Cancelled Treatment:    Reason Eval/Treat Not Completed: Medical issues which prohibited therapy. Spoke with pt. Currently awaiting general surgery consult for pelvic abscess. Pt NPO for possible sx. Pt prefers to hold on OOB/mobility today. He mobilizes at mod I w/c level at baseline and feels he has not had a functional decline. PT to follow up tomorrow.   Lorriane Shire 05/11/2022, 11:01 AM

## 2022-05-11 NOTE — Consult Note (Signed)
WOC Nurse Consult Note: chronic L thigh wound, patient is a paraplegic and is wheelchair bound; found to have osteomyelitis of greater trochanter  Reason for Consult: L thigh wound  Wound type: Unstageable Pressure Injury, full thickness L elbow  Pressure Injury POA: Yes Measurement: 1.  L thigh Unstageable 7 cms x 7 cms x 0.2 cms 100% gray black devitalized tissue  2.  L elbow full thickness tissue loss 1 cm x 0.5 cms x 0.2 cms 60% pink moist 40% yellow   Drainage (amount, consistency, odor)  1. moderate amount of tan exudate from L thigh wound, malodorous  2.  L elbow minimal serosanguinous  Periwound: intact  Dressing procedure/placement/frequency:  Clean L thigh wound with NS, pat dry, apply Dakin's moistened (not saturated) gauze to wound bed twice daily, cover with dry gauze and foam dressing or ABD pad whichever is preferred.  Clean L elbow full thickness wound with NS, apply Mupirocin to wound using Q tip applicator twice daily, cover with foam dressing. Change foam dressing q3 days and prn soiling.     Low air loss mattress will be ordered for this patient for moisture management and pressure redistribution.    Patent noted to have a Prevena Wound Vac on L foot/ankle.  Patient had debridement of left ankle by Dr. Sharol Given on 05/04/2022.  Dr. Sharol Given is consulted to see this patient..  WOC did not address this wound.  POC discussed with patient and bedside nurse.    WOC will not follow this patient.  Please re-consult if further wound care needs arise.   Thank you,    Jaze Rodino MSN, RN-BC, [employer]

## 2022-05-11 NOTE — Consult Note (Signed)
Consult Note  Lee Reilly May 03, 1985  [id number].    Requesting MD: Bonnielee Haff, MD Chief Complaint/Reason for Consult: pelvic abscess HPI:  Patient is a 37 year old male with PMH T4 paraplegia secondary to infancy neuroblastoma (WC bound), neurogenic bladder, depression, asthma, hidradenitis, anemia of chronic disease, and severe protein calorie malnutrition. He has a chronic left hip wound and recently underwent I&D of left ankle on 05/04/22, discharged 05/05/22 with 30 day course of doxycycline. Cultures were negative at that time but resulted 05/07/22 with providencia stuartii, pseudomonas, and bacteroides and patient was prescribed a 20 day course of cipro. He was admitted here from [facility] with positive wound cultures and leukocytosis for Dr. Sharol Given to evaluate left hip. Outside MRI noted perineal abscess, some findings in his medial thighs, and an area near his anus. He has had no drainage or issues that he is aware of his is perineum or thighs.  General surgery consulted as well. Patient has been seen by our service previously in March 2023 with perineal hidradenitis suppurativa.   ROS: ROS: see HPI  Family History  Problem Relation Age of Onset   Diabetes Father    Colon cancer Neg Hx    Prostate cancer Neg Hx    CAD Neg Hx     Past Medical History:  Diagnosis Date   Acne 05/01/2013   Anemia    Asthma    Neuroblastoma ([place])    @ 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    Neurogenic bladder    has no control, occasional cath   Paraplegia [facility])    Tumor T4 @ age 73month had surgery   Pneumonia    Tachycardia     Past Surgical History:  Procedure Laterality Date   [facility]  FOOT SURGERY Left 2015   @ [facility]  I & D EXTREMITY Left 06/02/2021   Procedure: EXCISION LEFT DISTAL FIBULA WOUND CLOSURE, PIN PLACEMENT TO STABILIZE ANKLE;  Surgeon: DNewt Minion MD;  [place] [facility]  Service: Orthopedics;  Laterality: Left;   I & D EXTREMITY Left 05/04/2022    Procedure: DEBRIDEMENT LEFT ANKLE;  Surgeon: DNewt Minion MD;  [place] [facility]  Service: Orthopedics;  Laterality: Left;   SPINAL FUSION  1993   SPINE SURGERY     747monthand 6 years    Social History:  reports that he has quit smoking. He has never used smokeless tobacco. He reports current alcohol use of about 2.0 standard drinks of alcohol per week. He reports current drug use. Drug: Marijuana.  Allergies: No Known Allergies  Medications Prior to Admission  Medication Sig Dispense Refill   Cyanocobalamin (VITAMIN B-12 PO) Take 5 mLs by mouth daily.     doxycycline (VIBRA-TABS) 100 MG tablet Take 1 tablet (100 mg total) by mouth 2 (two) times daily. 60 tablet 0   ferrous sulfate 325 (65 FE) MG tablet Take 1 tablet (325 mg total) by mouth daily. 30 tablet 3   guaiFENesin (MUCINEX) 600 MG 12 hr tablet Take 600 mg by mouth 2 (two) times daily as needed for cough (congestion).     ciprofloxacin (CIPRO) 500 MG tablet Take 1 tablet (500 mg total) by mouth 2 (two) times daily. (Patient not taking: Reported on 05/11/2022) 40 tablet 0    Blood pressure (!) 99/52, pulse 100, temperature (!) 97.5 F (36.4 C), temperature source Oral, resp. rate 18, SpO2 100 %. Physical Exam:  General:  pleasant, WD,male who is laying in bed in NAD HEENT: head is normocephalic, atraumatic.  Sclera are noninjected.  PERRL.  Ears and nose without any masses or lesions.  Mouth is pink and moist Abd: soft, NT, ND,  Skin: warm and dry with no masses, lesions, or rashes.  He has a covered wound on his left hip.  This was not removed.  He has chronic skin changes to his perineum, medial proximal thighs, and buttock area c/w hidradenitis.  There is no active drainage from any of these sites.  One area near his rectum has some mild fluctuance.  This is not erythematous or cellulitic.  It appears more chronic.  It was cleared with betadine and an 18G needle was used to aspirate this area.  This did reveal 2cc of purulent  drainage. Psych: A&Ox3 with an appropriate affect.   Results for orders placed or performed during the hospital encounter of 05/10/22 (from the past 48 hour(s))  Type and screen [place]     Status: None   Collection Time: 05/10/22  9:38 PM  Result Value Ref Range   ABO/RH(D) B NEG    Antibody Screen NEG    Sample Expiration      05/13/2022,2359 Performed at [facility], [place] [address] [place], [place] [address]   Comprehensive metabolic panel     Status: Abnormal   Collection Time: 05/10/22 10:03 PM  Result Value Ref Range   Sodium 139 135 - 145 mmol/L   Potassium 3.1 (L) 3.5 - 5.1 mmol/L   Chloride 110 98 - 111 mmol/L   CO2 21 (L) 22 - 32 mmol/L   Glucose, Bld 97 70 - 99 mg/dL    Comment: Glucose reference range applies only to samples taken after fasting for at least 8 hours.   BUN 7 6 - 20 mg/dL   Creatinine, Ser 0.39 (L) 0.61 - 1.24 mg/dL   Calcium 8.0 (L) 8.9 - 10.3 mg/dL   Total Protein 7.7 6.5 - 8.1 g/dL   Albumin <1.5 (L) 3.5 - 5.0 g/dL   AST 36 15 - 41 U/L   ALT 30 0 - 44 U/L   Alkaline Phosphatase 88 38 - 126 U/L   Total Bilirubin 0.4 0.3 - 1.2 mg/dL   GFR, Estimated >60 >60 mL/min    Comment: (NOTE) Calculated using the CKD-EPI Creatinine Equation (2021)    Anion gap 8 5 - 15    Comment: Performed at [facility], [place] [address] [place], [place] [address]  Magnesium     Status: None   Collection Time: 05/10/22 10:03 PM  Result Value Ref Range   Magnesium 1.7 1.7 - 2.4 mg/dL    Comment: Performed at [facility], [place] [address] [place], [place] [address]  Phosphorus     Status: None   Collection Time: 05/10/22 10:03 PM  Result Value Ref Range   Phosphorus 2.6 2.5 - 4.6 mg/dL    Comment: Performed at [facility], [place] [address], [place], [place] [address]  CBC with Differential/Platelet     Status: Abnormal   Collection Time: 05/10/22 10:03 PM  Result Value Ref Range   WBC 16.5 (H) 4.0 - 10.5 K/uL   RBC  3.37 (L) 4.22 - 5.81 MIL/uL   Hemoglobin 7.4 (L) 13.0 - 17.0 g/dL    Comment: Reticulocyte Hemoglobin testing may be clinically indicated, consider ordering this additional test [id number]    HCT 25.2 (L) 39.0 -  52.0 %   MCV 74.8 (L) 80.0 - 100.0 fL   MCH 22.0 (L) 26.0 - 34.0 pg   MCHC 29.4 (L) 30.0 - 36.0 g/dL   RDW 17.9 (H) 11.5 - 15.5 %   Platelets 550 (H) 150 - 400 K/uL   nRBC 0.0 0.0 - 0.2 %   Neutrophils Relative % 82 %   Neutro Abs 13.6 (H) 1.7 - 7.7 K/uL   Lymphocytes Relative 9 %   Lymphs Abs 1.6 0.7 - 4.0 K/uL   Monocytes Relative 4 %   Monocytes Absolute 0.7 0.1 - 1.0 K/uL   Eosinophils Relative 3 %   Eosinophils Absolute 0.5 0.0 - 0.5 K/uL   Basophils Relative 1 %   Basophils Absolute 0.1 0.0 - 0.1 K/uL   Immature Granulocytes 1 %   Abs Immature Granulocytes 0.09 (H) 0.00 - 0.07 K/uL    Comment: Performed at [facility] [address] [place], [place] [address]  Protime-INR     Status: Abnormal   Collection Time: 05/10/22 10:03 PM  Result Value Ref Range   Prothrombin Time 15.3 (H) 11.4 - 15.2 seconds   INR 1.2 0.8 - 1.2    Comment: (NOTE) INR goal varies based on device and disease states. Performed at [facility], [place] [address], [place], [place] [address]   TSH     Status: None   Collection Time: 05/10/22 10:03 PM  Result Value Ref Range   TSH 1.689 0.350 - 4.500 uIU/mL    Comment: Performed by a 3rd Generation assay with a functional sensitivity of <=0.01 uIU/mL. Performed at [facility], [place] [address], [place], [place] [address]   Reticulocytes     Status: Abnormal   Collection Time: 05/10/22 10:03 PM  Result Value Ref Range   Retic Ct Pct 1.9 0.4 - 3.1 %   RBC. 3.42 (L) 4.22 - 5.81 MIL/uL   Retic Count, Absolute 63.3 19.0 - 186.0 K/uL   Immature Retic Fract 25.0 (H) 2.3 - 15.9 %    Comment: Performed at [facility] [address], [place], [place] [address]  Vitamin B12     Status: None   Collection Time: 05/10/22  10:03 PM  Result Value Ref Range   Vitamin B-12 825 180 - 914 pg/mL    Comment: (NOTE) This assay is not validated for testing neonatal or myeloproliferative syndrome specimens for Vitamin B12 levels. Performed at [facility], [place] [address], [place], [place] [address]   Folate     Status: None   Collection Time: 05/10/22 10:03 PM  Result Value Ref Range   Folate 7.6 >5.9 ng/mL    Comment: Performed at [facility] [address], [place], [place] [address]  Iron and TIBC     Status: Abnormal   Collection Time: 05/10/22 10:03 PM  Result Value Ref Range   Iron 15 (L) 45 - 182 ug/dL   TIBC 115 (L) 250 - 450 ug/dL   Saturation Ratios 13 (L) 17.9 - 39.5 %   UIBC 100 ug/dL    Comment: Performed at [facility] [address], [place], [place] [address]  Ferritin     Status: None   Collection Time: 05/10/22 10:03 PM  Result Value Ref Range   Ferritin 240 24 - 336 ng/mL    Comment: Performed at [facility], [place] [address], [place], [place] [address]  MRSA Next Gen by PCR, Nasal  Status: None   Collection Time: 05/10/22 11:15 PM   Specimen: Nasal Mucosa; Nasal Swab  Result Value Ref Range   MRSA by PCR Next Gen NOT DETECTED NOT DETECTED    Comment: (NOTE) The GeneXpert MRSA Assay (FDA approved for NASAL specimens only), is one component of a comprehensive MRSA colonization surveillance program. It is not intended to diagnose MRSA infection nor to guide or monitor treatment for MRSA infections. Test performance is not FDA approved in patients less than 5 years old. Performed at [facility], [place] [address], [place], [place] [address]   Comprehensive metabolic panel     Status: Abnormal   Collection Time: 05/11/22  9:31 AM  Result Value Ref Range   Sodium 135 135 - 145 mmol/L   Potassium 3.5 3.5 - 5.1 mmol/L   Chloride 109 98 - 111 mmol/L   CO2 19 (L) 22 - 32 mmol/L   Glucose, Bld 84 70 - 99 mg/dL    Comment: Glucose reference range applies  only to samples taken after fasting for at least 8 hours.   BUN <5 (L) 6 - 20 mg/dL   Creatinine, Ser 0.40 (L) 0.61 - 1.24 mg/dL   Calcium 7.8 (L) 8.9 - 10.3 mg/dL   Total Protein 7.3 6.5 - 8.1 g/dL   Albumin <1.5 (L) 3.5 - 5.0 g/dL   AST 26 15 - 41 U/L   ALT 26 0 - 44 U/L   Alkaline Phosphatase 86 38 - 126 U/L   Total Bilirubin 0.8 0.3 - 1.2 mg/dL   GFR, Estimated >60 >60 mL/min    Comment: (NOTE) Calculated using the CKD-EPI Creatinine Equation (2021)    Anion gap 7 5 - 15    Comment: Performed at [facility], [place] [address] [place], [place] [address]  CBC     Status: Abnormal   Collection Time: 05/11/22  9:31 AM  Result Value Ref Range   WBC 16.0 (H) 4.0 - 10.5 K/uL   RBC 3.28 (L) 4.22 - 5.81 MIL/uL   Hemoglobin 7.2 (L) 13.0 - 17.0 g/dL    Comment: Reticulocyte Hemoglobin testing may be clinically indicated, consider ordering this additional test [id number]    HCT 24.8 (L) 39.0 - 52.0 %   MCV 75.6 (L) 80.0 - 100.0 fL   MCH 22.0 (L) 26.0 - 34.0 pg   MCHC 29.0 (L) 30.0 - 36.0 g/dL   RDW 18.0 (H) 11.5 - 15.5 %   Platelets 493 (H) 150 - 400 K/uL   nRBC 0.0 0.0 - 0.2 %    Comment: Performed at [facility], [place] [address], [place], [place] [address]  Sedimentation rate     Status: Abnormal   Collection Time: 05/11/22  9:31 AM  Result Value Ref Range   Sed Rate 138 (H) 0 - 16 mm/hr    Comment: Performed at [facility] [address], [place], [place] [address]  C-reactive protein     Status: Abnormal   Collection Time: 05/11/22  9:31 AM  Result Value Ref Range   CRP 15.1 (H) <1.0 mg/dL    Comment: Performed at [facility] [address] [place], [place] [address]   [place] EKG SITE RITE  Result Date: 05/11/2022 If [facility] image not attached, placement could not be confirmed due to current cardiac rhythm.     Assessment/Plan Perineal/thigh/buttock hidradenitis suppurativa with small perianal abscess The patient has been seen, examined, chart,  labs, vitals, and  imaging report in paper chart reviewed.  The patient appears to have chronic hidradenitis and findings on MRI are c/w with chronic changes on physical exam.  There was a small area of fluctuance noted just left of his rectum that was aspirated with a small amount of return.  Again, this appears likely more chronic in nature and not acute given no erythema, cellulitis, edema, etc.  No further general surgery needs have been identified.  Will allow a regular diet as he has no ortho needs for OR today as well per ortho PA, whom I discussed his case with.  I relayed my findings and these recommendations to the primary service.  We are available as needed.     I reviewed Consultant ortho notes, hospitalist notes, last 24 h vitals and pain scores, last 48 h intake and output, last 24 h labs and trends, and last 24 h imaging results.   Henreitta Cea, [facility] 05/11/2022, 11:35 AM Please see Amion for pager number during day hours 7:00am-4:30pm

## 2022-05-11 NOTE — TOC Initial Note (Signed)
Transition of Care [facility]) - Initial/Assessment Note    Patient Details  Name: Lee Reilly MRN: [id number] Date of Birth: January 25, 1986  Transition of Care [facility]) CM/SW Contact:    Jinger Neighbors, LCSW Phone Number: 05/11/2022, 11:25 AM  Clinical Narrative:                  CSW met with pt and his mother at bedside. Pt AAOx4, appears stated age, and engaged well with CSW. Pt is open to SNF, but also weighing options for home. CSW discussed community supports that will assist pt when he discharges from either the SNF or the hospital to home. CSW will complete work up and provide pt with SNF options.    Expected Discharge Plan: Skilled Nursing Facility Barriers to Discharge: Continued Medical Work up   Patient Goals and CMS Choice   CMS Medicare.gov Compare Post Acute Care list provided to:: Patient        Expected Discharge Plan and Services                                              Prior Living Arrangements/Services   Lives with:: Self Patient language and need for interpreter reviewed:: Yes Do you feel safe going back to the place where you live?: Yes      Need for Family Participation in Patient Care: Yes (Comment) Care giver support system in place?: Yes (comment)   Criminal Activity/Legal Involvement Pertinent to Current Situation/Hospitalization: No - Comment as needed  Activities of Daily Living Home Assistive Devices/Equipment: Wheelchair, Transfer board, Tub transfer bench ADL Screening (condition at time of admission) Patient's cognitive ability adequate to safely complete daily activities?: Yes Is the patient deaf or have difficulty hearing?: No Does the patient have difficulty seeing, even when wearing glasses/contacts?: Yes (glasses or contacts) Does the patient have difficulty concentrating, remembering, or making decisions?: No Patient able to express need for assistance with ADLs?: Yes Does the patient have difficulty dressing or bathing?:  No Independently performs ADLs?: Yes (appropriate for developmental age) Communication: Independent Dressing (OT): Independent with device (comment) Grooming: Independent with device (comment) Is this a change from baseline?: Pre-admission baseline Feeding: Independent Bathing: Independent with device (comment) Toileting: Independent with device (comment) Is this a change from baseline?: Pre-admission baseline In/Out Bed: Independent with device (comment) Is this a change from baseline?: Pre-admission baseline Walks in Home: Dependent Is this a change from baseline?: Pre-admission baseline Does the patient have difficulty walking or climbing stairs?: Yes Weakness of Legs: Both Weakness of Arms/Hands: None  Permission Sought/Granted                  Emotional Assessment Appearance:: Appears stated age Attitude/Demeanor/Rapport: Engaged Affect (typically observed): Appropriate Orientation: : Oriented to Self, Oriented to Place, Oriented to  Time, Oriented to Situation Alcohol / Substance Use: Not Applicable Psych Involvement: No (comment)  Admission diagnosis:  Osteomyelitis ([place]) [M86.9] Patient Active Problem List   Diagnosis Date Noted   Osteomyelitis ([place]) 05/10/2022   Skin ulcer of left lower leg with fat layer exposed ([place]) 05/04/2022   Ulcer of left lower leg ([place]) 05/04/2022   Ankle wound, left, subsequent encounter 06/27/2021   Medication monitoring encounter 06/27/2021   Abscess of right thigh    Septic arthritis of right foot ([place])    Subacute osteomyelitis, left ankle and foot (HCC)    Leukocytosis  Pressure injury of skin 05/30/2021   Hypokalemia 05/29/2021   Microcytic anemia 05/29/2021   Protein-calorie malnutrition, severe ([place]) 05/29/2021   PCP NOTES >>>>>>>>>>>>>>>>>>>>>>>>>>>> 06/13/2015   Annual physical exam 05/01/2013   Acne 05/01/2013   Substance abuse ([place]) 09/05/2011   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   Paraplegia ([place]) 04/08/2007   Asthma  04/08/2007   Neurogenic bladder 04/08/2007   HIDRADENITIS SUPPURATIVA 04/08/2007   PCP:  Patient, No Pcp Per Pharmacy:   [facility] [id number], [place] [address] AT [facility] [place] [address] Phone: [phone] Fax: [phone]     Social Determinants of Health ([place]) Social History: SDOH Screenings   Food Insecurity: No Food Insecurity (05/05/2022)  Housing: Low Risk  (05/05/2022)  Transportation Needs: No Transportation Needs (05/11/2022)  Utilities: Not At Risk (05/05/2022)  Tobacco Use: Medium Risk (05/11/2022)   SDOH Interventions:     Readmission Risk Interventions     No data to display

## 2022-05-11 NOTE — Assessment & Plan Note (Signed)
Will replace and check mg level

## 2022-05-11 NOTE — NC FL2 (Signed)
[place] LEVEL OF CARE FORM     IDENTIFICATION  Patient Name: Lee Reilly Birthdate: [birth year] Sex: male Admission Date (Current Location): 05/10/2022  [facility] and [place] Number:      Facility and Address:  [facility], [place] [address] [place], [place] [address]      Provider Number: [id number]  Attending Physician Name and Address:  Bonnielee Haff, MD  Relative Name and Phone Number:  Jacobb, Mohiuddin Mother [phone]    Current Level of Care: Hospital Recommended Level of Care: [place] Prior Approval Number:    Date Approved/Denied:   PASRR Number: [id number]  Discharge Plan: SNF    Current Diagnoses: Patient Active Problem List   Diagnosis Date Noted   Osteomyelitis ([place]) 05/10/2022   Skin ulcer of left lower leg with fat layer exposed ([place]) 05/04/2022   Ulcer of left lower leg ([place]) 05/04/2022   Ankle wound, left, subsequent encounter 06/27/2021   Medication monitoring encounter 06/27/2021   Abscess of right thigh    Septic arthritis of right foot ([place])    Subacute osteomyelitis, left ankle and foot ([place])    Leukocytosis    Pressure injury of skin 05/30/2021   Hypokalemia 05/29/2021   Microcytic anemia 05/29/2021   Protein-calorie malnutrition, severe ([place]) 05/29/2021   PCP NOTES >>>>>>>>>>>>>>>>>>>>>>>>>>>> 06/13/2015   Annual physical exam 05/01/2013   Acne 05/01/2013   Substance abuse ([place]) 09/05/2011   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   Paraplegia ([place]) 04/08/2007   Asthma 04/08/2007   Neurogenic bladder 04/08/2007   HIDRADENITIS SUPPURATIVA 04/08/2007    Orientation RESPIRATION BLADDER Height & Weight     Self, Time, Situation, Place  Normal Incontinent, External catheter Weight:   Height:     BEHAVIORAL SYMPTOMS/MOOD NEUROLOGICAL BOWEL NUTRITION STATUS      Continent Diet (see d/c summary)  AMBULATORY STATUS COMMUNICATION OF NEEDS Skin   Limited Assist Verbally Normal                        Personal Care Assistance Level of Assistance  Bathing, Feeding, Dressing Bathing Assistance: Limited assistance Feeding assistance: Independent Dressing Assistance: Limited assistance     Functional Limitations Info  Sight, Hearing, Speech Sight Info: Adequate Hearing Info: Adequate Speech Info: Adequate    SPECIAL CARE FACTORS FREQUENCY   (Wound care; see d/c summary for wound care orders)                    Contractures Contractures Info: Not present    Additional Factors Info  Code Status Code Status Info: full             Current Medications (05/11/2022):  This is the current hospital active medication list Current Facility-Administered Medications  Medication Dose Route Frequency Provider Last Rate Last Admin   0.9 %  sodium chloride infusion   Intravenous Continuous Bonnielee Haff, MD       acetaminophen (TYLENOL) tablet 650 mg  650 mg Oral Q6H PRN Toy Baker, MD       Or   acetaminophen (TYLENOL) suppository 650 mg  650 mg Rectal Q6H PRN Doutova, Anastassia, MD       ceFEPIme (MAXIPIME) 2 g in sodium chloride 0.9 % 100 mL IVPB  2 g Intravenous Q8H Jule Ser N, DO 200 mL/hr at 05/11/22 0952 2 g at 05/11/22 [id number]   dextromethorphan-guaiFENesin (MUCINEX DM) 30-600 MG per 12 hr tablet 1 tablet  1 tablet Oral BID Toy Baker, MD  1 tablet at 05/10/22 2345   HYDROcodone-acetaminophen (NORCO/VICODIN) 5-325 MG per tablet 1-2 tablet  1-2 tablet Oral Q4H PRN Toy Baker, MD       metroNIDAZOLE (FLAGYL) tablet 500 mg  500 mg Oral Q12H Mignon Pine, DO       mupirocin ointment (BACTROBAN) 2 %   Topical BID Bonnielee Haff, MD   Given at 05/11/22 0955   potassium chloride 10 mEq in 100 mL IVPB  10 mEq Intravenous Q1 Hr x 4 Bonnielee Haff, MD       sodium hypochlorite (DAKIN'S 1/4 STRENGTH) topical solution   Irrigation BID Bonnielee Haff, MD   Given at 05/11/22 0956   vancomycin (VANCOREADY) IVPB 750 mg/150 mL  750 mg  Intravenous Q12H Susa Raring, [facility]         Discharge Medications: Please see discharge summary for a list of discharge medications.  Relevant Imaging Results:  Relevant Lab Results:   Additional Information SS#: [id number]  Jinger Neighbors, LCSW

## 2022-05-11 NOTE — Progress Notes (Signed)
Initial Nutrition Assessment  DOCUMENTATION CODES:   Not applicable  INTERVENTION:  Once diet advances:   -Add Ensure Enlive po BID, each supplement provides 350 kcal and 20 grams of protein.  - Add Vit C.   - Add Zinc (x14 days)   - Add -1 packet Juven BID, each packet provides 95 calories, 2.5 grams of protein (collagen), and 9.8 grams of carbohydrate (3 grams sugar); also contains 7 grams of L-arginine and L-glutamine, 300 mg vitamin C, 15 mg vitamin E, 1.2 mcg vitamin B-12, 9.5 mg zinc, 200 mg calcium, and 1.5 g  Calcium Beta-hydroxy-Beta-methylbutyrate to support wound healing   - Add MVI q day.   NUTRITION DIAGNOSIS:   Increased nutrient needs related to wound healing as evidenced by estimated needs.  GOAL:   Patient will meet greater than or equal to 90% of their needs  MONITOR:   Diet advancement  REASON FOR ASSESSMENT:   Consult Assessment of nutrition requirement/status  ASSESSMENT:   37 y.o. male admits related to left hip wound. PMH includes: anemia, asthma, neuroblastoma, neurogenic bladder, paraplegia, pneumonia, tachycardia. Pt is currently receiving medical management related to osteomyelitis.  Meds reviewed. Labs reviewed: K low.   Pt is currently NPO. Pt was being seen by another provider at time of assessment. Wts stable per record. Pt is currently unable to meet needs due to diet order. RD will add wound healing vitamins and supplements once diet advances. Will continue to monitor POC and diet advancement.   NUTRITION - FOCUSED PHYSICAL EXAM:  Assess at follow-up.   Diet Order:   Diet Order             Diet NPO time specified Except for: Sips with Meds  Diet effective ____                   EDUCATION NEEDS:   Not appropriate for education at this time  Skin:  Skin Assessment: Skin Integrity Issues: Skin Integrity Issues:: Unstageable Unstageable: left thigh  Last BM:  3/1 - Type 3  Height:   Ht Readings from Last 1  Encounters:  05/04/22 '5\' 3"'$  (1.6 m)    Weight:   Wt Readings from Last 1 Encounters:  05/04/22 52.2 kg    Ideal Body Weight:     BMI:  There is no height or weight on file to calculate BMI.  Estimated Nutritional Needs:   Kcal:  [id number] kcals  Protein:  80-90 gm  Fluid:  >/= 1.5 L  Thalia Bloodgood, RD, LDN, CNSC.

## 2022-05-11 NOTE — Progress Notes (Signed)
Patient ID: Lee Reilly, male   DOB: 1985/03/17, 37 y.o.   MRN: [id number]   LOS: 1 day   Subjective: No change in subjective. Pt transferred here 2/2 pelvic abscess and left greater troch osteo.   Objective: Vital signs in last 24 hours: Temp:  [97.5 F (36.4 C)-98.8 F (37.1 C)] 97.5 F (36.4 C) (03/01 0915) Pulse Rate:  [90-118] 100 (03/01 0915) Resp:  [16-18] 18 (03/01 0915) BP: (93-113)/(49-68) 99/52 (03/01 0915) SpO2:  [83 %-100 %] 100 % (03/01 0915) Last BM Date : (P) 05/11/22   Laboratory  CBC Recent Labs    05/10/22 2203 05/11/22 0931  WBC 16.5* 16.0*  HGB 7.4* 7.2*  HCT 25.2* 24.8*  PLT 550* 493*   BMET Recent Labs    05/10/22 2203  NA 139  K 3.1*  CL 110  CO2 21*  GLUCOSE 97  BUN 7  CREATININE 0.39*  CALCIUM 8.0*     Physical Exam General appearance: alert and no distress Left hip -- Ulceration unchanged in appearance   Assessment/Plan: Left greater troch osteo -- Agree with ID consultation. No plans for acute surgical treatment of osteo. Dr. Sharol Given to evaluate on Sunday or Monday.  Pelvic abscess -- Given location feel this will be more appropriately addressed by GS. Understand that he is awaiting consult from them.   Lisette Abu, PA-C Orthopedic Surgery [phone] 05/11/2022

## 2022-05-12 DIAGNOSIS — D638 Anemia in other chronic diseases classified elsewhere: Secondary | ICD-10-CM

## 2022-05-12 DIAGNOSIS — M869 Osteomyelitis, unspecified: Secondary | ICD-10-CM | POA: Diagnosis not present

## 2022-05-12 LAB — CBC
HCT: 21.6 % — ABNORMAL LOW (ref 39.0–52.0)
Hemoglobin: 6.4 g/dL — CL (ref 13.0–17.0)
MCH: 22.6 pg — ABNORMAL LOW (ref 26.0–34.0)
MCHC: 29.6 g/dL — ABNORMAL LOW (ref 30.0–36.0)
MCV: 76.3 fL — ABNORMAL LOW (ref 80.0–100.0)
Platelets: 479 10*3/uL — ABNORMAL HIGH (ref 150–400)
RBC: 2.83 MIL/uL — ABNORMAL LOW (ref 4.22–5.81)
RDW: 18.1 % — ABNORMAL HIGH (ref 11.5–15.5)
WBC: 15.1 10*3/uL — ABNORMAL HIGH (ref 4.0–10.5)
nRBC: 0 % (ref 0.0–0.2)

## 2022-05-12 LAB — BASIC METABOLIC PANEL
Anion gap: 9 (ref 5–15)
BUN: <5 mg/dL — ABNORMAL LOW (ref 6–20)
CO2: 20 mmol/L — ABNORMAL LOW (ref 22–32)
Calcium: 7.9 mg/dL — ABNORMAL LOW (ref 8.9–10.3)
Chloride: 106 mmol/L (ref 98–111)
Creatinine, Ser: 0.39 mg/dL — ABNORMAL LOW (ref 0.61–1.24)
GFR, Estimated: >60 mL/min (ref >60)
Glucose, Bld: 93 mg/dL (ref 70–99)
Potassium: 3.3 mmol/L — ABNORMAL LOW (ref 3.5–5.1)
Sodium: 135 mmol/L (ref 135–145)

## 2022-05-12 LAB — MAGNESIUM: Magnesium: 1.7 mg/dL (ref 1.7–2.4)

## 2022-05-12 LAB — PREPARE RBC (CROSSMATCH)

## 2022-05-12 MED ORDER — ZINC SULFATE 220 (50 ZN) MG PO CAPS
220.0000 mg | ORAL_CAPSULE | Freq: Every day | ORAL | Status: DC
  Administered 2022-05-12 – 2022-05-15 (×4): 220 mg via ORAL
  Filled 2022-05-12 (×5): qty 1

## 2022-05-12 MED ORDER — SODIUM CHLORIDE 0.9% IV SOLUTION: Freq: Once | INTRAVENOUS | Status: DC

## 2022-05-12 MED ORDER — JUVEN PO PACK
1.0000 | PACK | Freq: Two times a day (BID) | ORAL | Status: DC
  Filled 2022-05-12 (×2): qty 1

## 2022-05-12 MED ORDER — VITAMIN C 500 MG PO TABS
500.0000 mg | ORAL_TABLET | Freq: Every day | ORAL | Status: DC
  Administered 2022-05-12 – 2022-05-15 (×4): 500 mg via ORAL
  Filled 2022-05-12 (×5): qty 1

## 2022-05-12 MED ORDER — ADULT MULTIVITAMIN W/MINERALS CH
1.0000 | ORAL_TABLET | Freq: Every day | ORAL | Status: DC
  Administered 2022-05-12 – 2022-05-15 (×4): 1 via ORAL
  Filled 2022-05-12 (×5): qty 1

## 2022-05-12 MED ORDER — MAGNESIUM SULFATE 2 GM/50ML IV SOLN
2.0000 g | Freq: Once | INTRAVENOUS | Status: AC
  Administered 2022-05-12: 2 g via INTRAVENOUS
  Filled 2022-05-12: qty 50

## 2022-05-12 MED ORDER — POTASSIUM CHLORIDE CRYS ER 20 MEQ PO TBCR
40.0000 meq | EXTENDED_RELEASE_TABLET | Freq: Three times a day (TID) | ORAL | Status: AC
  Administered 2022-05-12 (×2): 40 meq via ORAL
  Filled 2022-05-12 (×2): qty 2

## 2022-05-12 MED ORDER — ENSURE ENLIVE PO LIQD: 237.0000 mL | Freq: Two times a day (BID) | ORAL | Status: DC

## 2022-05-12 NOTE — Progress Notes (Signed)
Dr. Marlowe Sax paged and chat sent , pt. Hgb 6.4. Awaiting orders and/or response.

## 2022-05-12 NOTE — Progress Notes (Signed)
Overnight progress note  Hemoglobin 6.4 on morning labs, was 7.2 yesterday.  Baseline hemoglobin 7-8.  No gross bleeding from his wounds per RN.  No bloody stools reported.  Type and screen, 1 unit PRBCs ordered after obtaining consent from the patient.  Follow-up posttransfusion H&H.

## 2022-05-12 NOTE — Progress Notes (Signed)
[facility] PROGRESS NOTE   Lee Reilly [id number] DOB: 1985/07/19 DOA: 05/10/2022  PCP: Patient, No Pcp Per  Brief History/Interval Summary: 37 y.o. 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 Wheelchair-bound at baseline with chronic left hip wound. Recent left ankle I&D (05/04/22) for OM and ulcer and discharged on 05/05/22 with 30 day course of doxycycline due to negative culture. However, on 05/07/22 he was prescribed a 20 day course of cipro as cultures returned with (providencia stuartii, pseudomonas, and bacteroides).   He presented to his PCP office on 2/26 for follow up and WBC noted to be 22.7; (up from 2/23 was 24.8) and WBC on 2/29 at [facility] noted to be 16.7. He was referred to wound care outpatient, wound cultures were obtained (reportedly positive but cannot see organisms), and imaging ordered via xray of B/L hips. Appears he then presented to [place] [facility] ER due to positive wound cultures and leukocytosis.   MRI left hip which reveled multiple findings subcutaneous abscess involving perineum, inner thigh. Wound noted in lateral hip measuring 4.6 x 5.3 cm with cellulitis involving gluteal muscle and osteomyelitis of greater trochanter. Case was discussed with Dr. Sharol Given who recommended transfer to [facility] for multi-disciplinary approach to likely include ortho, general surgery, ID, and possibly plastic surgery. He was started on vancomycin and Zosyn. CRP noted to be elevated at 155 which is actually slightly down from 178 on 26 February.   Consultants: Orthopedics.  General surgery.  Infectious disease.  Procedures: None yet    Subjective/Interval History: Patient denies any complaints this morning.  No nausea or vomiting.  No shortness of  breath.    Assessment/Plan:  Osteomyelitis of the greater trochanter/left hip open wound with draining abscess/myositis around the left hip Seen by orthopedics.  Dr. Sharol Given to evaluate him either tomorrow or Monday. Patient was on broad-spectrum antibiotics with vancomycin and Zosyn.  Infectious disease was consulted.  Antibiotics currently on hold in case patient needs to undergo surgery and if cultures are sent in order to increase the culture ER.  Remains afebrile.  WBC slightly better though he does have some chronicity to his leukocytosis. Borderline low blood pressures noted but patient is asymptomatic. PICC line was placed due to limited venous access.  Perineal abscess along with the left perianal abscess Incidentally noted on MRI done in outside facility.  Seen by general surgery.  Findings were thought to be chronic based on their examination.  Most likely secondary to hidradenitis suppurative.  They did aspirate a small amount from the perianal area.  They do not recommend any surgical intervention at this time.    Left ankle wound Underwent surgical intervention about a week ago. Cultures positive for [facility] ER, Pseudomonas and Bacteroides.  Patient was on ciprofloxacin prior to admission.  Has a wound VAC.  ID is following.  Dr. Sharol Given at 2 AM tomorrow or Monday.    Left kidney staghorn calculus with hydroureteronephrosis Renal function is normal.  Will likely need to be addressed by urology in the outpatient setting.  Hypokalemia Continue to replete.  Magnesium 1.7 and will be repleted.  Microcytic anemia Anemia panel reviewed.  No deficiencies identified.  Could be due to chronic disease.   Hemoglobin noted to be 6.4 today.  Will need blood transfusion.  Patient has been reluctant.  Will discuss with him again.  Paraplegia This is chronic.  Also has a history of neurogenic bladder.  DVT Prophylaxis: SCDs Code Status: Full code Family Communication: Discussed with  patient Disposition Plan: To be determined  Status is: Inpatient Remains inpatient appropriate because: Multiple abscesses, osteomyelitis    Medications: Scheduled:  sodium chloride   Intravenous Once   Chlorhexidine Gluconate Cloth  6 each Topical Daily   dextromethorphan-guaiFENesin  1 tablet Oral BID   mupirocin ointment   Topical BID   potassium chloride  40 mEq Oral TID   sodium hypochlorite   Irrigation BID   Continuous:  sodium chloride 75 mL/hr at 05/12/22 0255   [id number] **OR** acetaminophen, HYDROcodone-acetaminophen, sodium chloride flush  Antibiotics: Anti-infectives (From admission, onward)    Start     Dose/Rate Route Frequency Ordered Stop   05/11/22 1815  vancomycin (VANCOREADY) IVPB 750 mg/150 mL  Status:  Discontinued        750 mg 150 mL/hr over 60 Minutes Intravenous Every 12 hours 05/11/22 1053 05/11/22 1354   05/11/22 1000  ceFEPIme (MAXIPIME) 2 g in sodium chloride 0.9 % 100 mL IVPB  Status:  Discontinued        2 g 200 mL/hr over 30 Minutes Intravenous Every 8 hours 05/11/22 0904 05/11/22 1354   05/11/22 1000  metroNIDAZOLE (FLAGYL) tablet 500 mg  Status:  Discontinued        500 mg Oral Every 12 hours 05/11/22 0904 05/11/22 1354   05/10/22 2230  piperacillin-tazobactam (ZOSYN) IVPB 3.375 g  Status:  Discontinued        3.375 g 12.5 mL/hr over 240 Minutes Intravenous Every 8 hours 05/10/22 2134 05/11/22 0904   05/10/22 2230  vancomycin (VANCOCIN) IVPB 1000 mg/200 mL premix  Status:  Discontinued        1,000 mg 200 mL/hr over 60 Minutes Intravenous Every 8 hours 05/10/22 2136 05/11/22 1053       Objective:  Vital Signs  Vitals:   05/11/22 1620 05/11/22 2019 05/12/22 0652 05/12/22 0718  BP: (!) 94/37 (!) 95/58 (!) 91/37 (!) 108/36  Pulse: 99 (!) 109 (!) 106 (!) 109  Resp: '18 16 17 18  '$ Temp: (!) 97.5 F (36.4 C) 99.1 F (37.3 C) 98.4 F (36.9 C) 98.4 F (36.9 C)  TempSrc: Oral  Oral Oral  SpO2: 100% 100% 100% 100%   No intake  or output data in the 24 hours ending 05/12/22 0941 There were no vitals filed for this visit.  General appearance: Awake alert.  In no distress Resp: Clear to auscultation bilaterally.  Normal effort Cardio: S1-S2 is normal regular.  No S3-S4.  No rubs murmurs or bruit GI: Abdomen is soft.  Nontender nondistended.  Bowel sounds are present normal.  No masses organomegaly Extremities: Wound VAC noted over left ankle Neurologic: Paraplegic   Lab Results:  Data Reviewed: I have personally reviewed following labs and reports of the imaging studies  CBC: Recent Labs  Lab 05/10/22 2203 05/11/22 0931 05/12/22 0311  WBC 16.5* 16.0* 15.1*  NEUTROABS 13.6*  --   --   HGB 7.4* 7.2* 6.4*  HCT 25.2* 24.8* 21.6*  MCV 74.8* 75.6* 76.3*  PLT 550* 493* 479*     Basic Metabolic Panel: Recent Labs  Lab 05/10/22 2203 05/11/22 0931 05/12/22 0311  NA 139 135 135  K 3.1* 3.5 3.3*  CL 110 109 106  CO2 21* 19* 20*  GLUCOSE 97 84 93  BUN 7 <5* <5*  CREATININE 0.39* 0.40* 0.39*  CALCIUM 8.0* 7.8*  7.9*  MG 1.7  --  1.7  PHOS 2.6  --   --      GFR: Estimated Creatinine Clearance: 92.4 mL/min (A) (by C-G formula based on SCr of 0.39 mg/dL (L)).  Liver Function Tests: Recent Labs  Lab 05/10/22 2203 05/11/22 0931  AST 36 26  ALT 30 26  ALKPHOS 88 86  BILITOT 0.4 0.8  PROT 7.7 7.3  ALBUMIN <1.5* <1.5*      Coagulation Profile: Recent Labs  Lab 05/10/22 2203  INR 1.2      Thyroid Function Tests: Recent Labs    05/10/22 2203  TSH 1.689     Anemia Panel: Recent Labs    05/10/22 2203  VITAMINB12 825  FOLATE 7.6  FERRITIN 240  TIBC 115*  IRON 15*  RETICCTPCT 1.9     Recent Results (from the past 240 hour(s))  Aerobic/Anaerobic Culture w Gram Stain (surgical/deep wound)     Status: None   Collection Time: 05/04/22  2:05 PM   Specimen: Soft Tissue, Other  Result Value Ref Range Status   Specimen Description TISSUE LEFT ANKLE  Final   Special Requests  NONE  Final   Gram Stain NO WBC SEEN NO ORGANISMS SEEN   Final   Culture   Final    RARE PROVIDENCIA STUARTII RARE PSEUDOMONAS AERUGINOSA RARE BACTEROIDES FRAGILIS BETA LACTAMASE NEGATIVE Performed at [facility], [place] [address] [place], [place] [address]    Report Status 05/07/2022 FINAL  Final   Organism ID, Bacteria PROVIDENCIA STUARTII  Final   Organism ID, Bacteria PSEUDOMONAS AERUGINOSA  Final      Susceptibility   Pseudomonas aeruginosa - MIC*    CEFTAZIDIME 4 SENSITIVE Sensitive     CIPROFLOXACIN <=0.25 SENSITIVE Sensitive     GENTAMICIN <=1 SENSITIVE Sensitive     IMIPENEM 2 SENSITIVE Sensitive     PIP/TAZO 8 SENSITIVE Sensitive     CEFEPIME 2 SENSITIVE Sensitive     * RARE PSEUDOMONAS AERUGINOSA   Providencia stuartii - MIC*    AMPICILLIN RESISTANT Resistant     CEFEPIME <=0.12 SENSITIVE Sensitive     CEFTAZIDIME <=1 SENSITIVE Sensitive     CEFTRIAXONE <=0.25 SENSITIVE Sensitive     CIPROFLOXACIN <=0.25 SENSITIVE Sensitive     GENTAMICIN RESISTANT Resistant     IMIPENEM 2 SENSITIVE Sensitive     TRIMETH/SULFA <=20 SENSITIVE Sensitive     AMPICILLIN/SULBACTAM 16 INTERMEDIATE Intermediate     PIP/TAZO <=4 SENSITIVE Sensitive     * RARE PROVIDENCIA STUARTII  MRSA Next Gen by PCR, Nasal     Status: None   Collection Time: 05/10/22 11:15 PM   Specimen: Nasal Mucosa; Nasal Swab  Result Value Ref Range Status   MRSA by PCR Next Gen NOT DETECTED NOT DETECTED Final    Comment: (NOTE) The GeneXpert MRSA Assay (FDA approved for NASAL specimens only), is one component of a comprehensive MRSA colonization surveillance program. It is not intended to diagnose MRSA infection nor to guide or monitor treatment for MRSA infections. Test performance is not FDA approved in patients less than 72 years old. Performed at [facility], [place] [address], [place], [place] [address]       Radiology Studies: [place] EKG SITE RITE  Result Date: 05/11/2022 If Site Rite image  not attached, placement could not be confirmed due to current cardiac rhythm.      LOS: 2 days   [facility] Pager on [email or web address]  05/12/2022, 9:41  AM

## 2022-05-12 NOTE — Progress Notes (Signed)
Dr. Marlowe Sax returned page and requested this RN to ask patient if he is willing to receive blood transfusion. This RN asked patient and , he is willing to receive Blood products.  Dr. Marlowe Sax called back and was told of patient in agreement to receive blood products if ordered.

## 2022-05-12 NOTE — Progress Notes (Signed)
PT Cancellation Note  Patient Details Name: Lee Reilly MRN: [id number] DOB: [birth year]   Cancelled Treatment:    Reason Eval/Treat Not Completed: Other (comment)  Patient politely declined OOB to recliner as he is more comfortable in the bed. Pt prone on arrival and able to transition to long-sitting independently. Mother present and reports she can bring pt's wheelchair to hospital tomorrow (3/3) morning. Will attempt evaluation and OOB once wheelchair present. Encouraged pt to remain mobile in bed to maintain his strength.    [place]  Office [phone]  Rexanne Mano 05/12/2022, 11:54 AM

## 2022-05-13 DIAGNOSIS — D638 Anemia in other chronic diseases classified elsewhere: Secondary | ICD-10-CM | POA: Diagnosis not present

## 2022-05-13 DIAGNOSIS — M869 Osteomyelitis, unspecified: Secondary | ICD-10-CM | POA: Diagnosis not present

## 2022-05-13 LAB — CBC
HCT: 27.5 % — ABNORMAL LOW (ref 39.0–52.0)
Hemoglobin: 8.4 g/dL — ABNORMAL LOW (ref 13.0–17.0)
MCH: 23.3 pg — ABNORMAL LOW (ref 26.0–34.0)
MCHC: 30.5 g/dL (ref 30.0–36.0)
MCV: 76.2 fL — ABNORMAL LOW (ref 80.0–100.0)
Platelets: 519 10*3/uL — ABNORMAL HIGH (ref 150–400)
RBC: 3.61 MIL/uL — ABNORMAL LOW (ref 4.22–5.81)
RDW: 17.9 % — ABNORMAL HIGH (ref 11.5–15.5)
WBC: 17 10*3/uL — ABNORMAL HIGH (ref 4.0–10.5)
nRBC: 0 % (ref 0.0–0.2)

## 2022-05-13 LAB — TYPE AND SCREEN
ABO/RH(D): B NEG
Antibody Screen: NEGATIVE
Unit division: 0

## 2022-05-13 LAB — BASIC METABOLIC PANEL
Anion gap: 7 (ref 5–15)
BUN: <5 mg/dL — ABNORMAL LOW (ref 6–20)
CO2: 23 mmol/L (ref 22–32)
Calcium: 8.3 mg/dL — ABNORMAL LOW (ref 8.9–10.3)
Chloride: 104 mmol/L (ref 98–111)
Creatinine, Ser: 0.4 mg/dL — ABNORMAL LOW (ref 0.61–1.24)
GFR, Estimated: >60 mL/min (ref >60)
Glucose, Bld: 90 mg/dL (ref 70–99)
Potassium: 4.2 mmol/L (ref 3.5–5.1)
Sodium: 134 mmol/L — ABNORMAL LOW (ref 135–145)

## 2022-05-13 LAB — BPAM RBC
Blood Product Expiration Date: 202403102359
ISSUE DATE / TIME: 202403021207
Unit Type and Rh: 1700

## 2022-05-13 NOTE — Progress Notes (Signed)
PT Cancellation Note  Patient Details Name: Lee Reilly MRN: [id number] DOB: [birth year]   Cancelled Treatment:    Reason Eval/Treat Not Completed: Other (comment)  Mother present and states she could not bring in pt's wheelchair today. She plans to bring it tomorrow. Noted pt with pressure sore on left hip/lateral thigh. Would like to see pt in his wheelchair to assess his positioning in chair and ability to perform pressure relief. Will attempt 3/4 p.m.   [place]  Office [phone]  Rexanne Mano 05/13/2022, 1:45 PM

## 2022-05-13 NOTE — Progress Notes (Signed)
[facility] PROGRESS NOTE   Lee Reilly [id number] DOB: Jul 24, 1985 DOA: 05/10/2022  PCP: Patient, No Pcp Per  Brief History/Interval Summary: 37 y.o. 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 Wheelchair-bound at baseline with chronic left hip wound. Recent left ankle I&D (05/04/22) for OM and ulcer and discharged on 05/05/22 with 30 day course of doxycycline due to negative culture. However, on 05/07/22 he was prescribed a 20 day course of cipro as cultures returned with (providencia stuartii, pseudomonas, and bacteroides).   He presented to his PCP office on 2/26 for follow up and WBC noted to be 22.7; (up from 2/23 was 24.8) and WBC on 2/29 at [facility] noted to be 16.7. He was referred to wound care outpatient, wound cultures were obtained (reportedly positive but cannot see organisms), and imaging ordered via xray of B/L hips. Appears he then presented to [place] [facility] due to positive wound cultures and leukocytosis.   MRI left hip which reveled multiple findings subcutaneous abscess involving perineum, inner thigh. Wound noted in lateral hip measuring 4.6 x 5.3 cm with cellulitis involving gluteal muscle and osteomyelitis of greater trochanter. Case was discussed with Dr. Sharol Given who recommended transfer to [facility] for multi-disciplinary approach to likely include ortho, general surgery, ID, and possibly plastic surgery. He was started on vancomycin and Zosyn. CRP noted to be elevated at 155 which is actually slightly down from 178 on 26 February.   Consultants: Orthopedics.  General surgery.  Infectious disease.  Procedures: None yet    Subjective/Interval History: Tolerated blood transfusion well without any issues.  No complaints offered this morning.       Assessment/Plan:  Osteomyelitis of the greater trochanter/left hip open wound with draining abscess/myositis around the left hip Seen by orthopedics.  Dr. Sharol Given to evaluate him either today or tomorrow.   Patient was on broad-spectrum antibiotics with vancomycin and Zosyn.  Infectious disease was consulted.  Antibiotics currently on hold in case patient needs to undergo surgery and if cultures are sent in order to increase the culture ER.  Remains afebrile.  WBC remains elevated although there is some chronicity to his leukocytosis. PICC line was placed due to limited venous access.  Perineal abscess along with the left perianal abscess Incidentally noted on MRI done in outside facility.  Seen by general surgery.  Findings were thought to be chronic based on their examination.  Most likely secondary to hidradenitis suppurativa.  They did aspirate a small amount from the perianal area.  They do not recommend any surgical intervention at this time.    Left ankle wound Underwent surgical intervention about a week ago. Cultures positive for [facility] ER, Pseudomonas and Bacteroides.  Patient was on ciprofloxacin prior to admission.   Has a wound VAC.  ID is following.  Dr. Sharol Given to see him later today or tomorrow.    Left kidney staghorn calculus with hydroureteronephrosis Renal function is normal.  Will likely need to be addressed by urology in the outpatient setting.  Hypotension Seems to be chronic.  He is asymptomatic.  Hypokalemia Supplemented.  Noted to be normal today.  Magnesium was 1.7 yesterday and was supplemented.    Microcytic anemia Anemia panel reviewed.  No deficiencies identified.  Could be due to chronic disease.   Hemoglobin was 6.4 yesterday.  Likely dilutional drop/secondary to acute illness.  Transfuse 1  unit of PRBC with improvement in hemoglobin.  Minimize blood draws as much as possible.  Paraplegia This is chronic.  Also has a history of neurogenic  bladder.  DVT Prophylaxis: SCDs Code Status: Full code Family Communication: Discussed with patient Disposition Plan: To be determined  Status is: Inpatient Remains inpatient appropriate because: Multiple abscesses, osteomyelitis    Medications: Scheduled:  sodium chloride   Intravenous Once   ascorbic acid  500 mg Oral Daily   Chlorhexidine Gluconate Cloth  6 each Topical Daily   dextromethorphan-guaiFENesin  1 tablet Oral BID   feeding supplement  237 mL Oral BID BM   multivitamin with minerals  1 tablet Oral Daily   mupirocin ointment   Topical BID   nutrition supplement (JUVEN)  1 packet Oral BID BM   sodium hypochlorite   Irrigation BID   zinc sulfate  220 mg Oral Daily   Continuous:   [id number] **OR** acetaminophen, HYDROcodone-acetaminophen, sodium chloride flush  Antibiotics: Anti-infectives (From admission, onward)    Start     Dose/Rate Route Frequency Ordered Stop   05/11/22 1815  vancomycin (VANCOREADY) IVPB 750 mg/150 mL  Status:  Discontinued        750 mg 150 mL/hr over 60 Minutes Intravenous Every 12 hours 05/11/22 1053 05/11/22 1354   05/11/22 1000  ceFEPIme (MAXIPIME) 2 g in sodium chloride 0.9 % 100 mL IVPB  Status:  Discontinued        2 g 200 mL/hr over 30 Minutes Intravenous Every 8 hours 05/11/22 0904 05/11/22 1354   05/11/22 1000  metroNIDAZOLE (FLAGYL) tablet 500 mg  Status:  Discontinued        500 mg Oral Every 12 hours 05/11/22 0904 05/11/22 1354   05/10/22 2230  piperacillin-tazobactam (ZOSYN) IVPB 3.375 g  Status:  Discontinued        3.375 g 12.5 mL/hr over 240 Minutes Intravenous Every 8 hours 05/10/22 2134 05/11/22 0904   05/10/22 2230  vancomycin (VANCOCIN) IVPB 1000 mg/200 mL premix  Status:  Discontinued        1,000 mg 200 mL/hr over 60 Minutes Intravenous Every 8 hours 05/10/22 2136 05/11/22 1053       Objective:  Vital Signs  Vitals:   05/12/22 1608 05/12/22 2039 05/13/22 0500 05/13/22 0810  BP: 98/62 (!) 95/59  (!) 95/49 98/67  Pulse: (!) 112 (!) 110 (!) 103 (!) 102  Resp: '18 17 18 17  '$ Temp: 98.9 F (37.2 C) (!) 97.4 F (36.3 C) 98.5 F (36.9 C) 98.1 F (36.7 C)  TempSrc: Oral Oral Oral Oral  SpO2: 100% 100% 99% 100%    Intake/Output Summary (Last 24 hours) at 05/13/2022 0850 Last data filed at 05/12/2022 1608 Gross per 24 hour  Intake 466 ml  Output --  Net 466 ml   There were no vitals filed for this visit.  General appearance: Awake alert.  In no distress Resp: Clear to auscultation bilaterally.  Normal effort Cardio: S1-S2 is normal regular.  No S3-S4.  No rubs murmurs or bruit GI: Abdomen is soft.  Nontender nondistended.  Bowel sounds are present normal.  No masses organomegaly Extremities: Wound VAC over left ankle   Lab Results:  Data Reviewed: I have personally reviewed following labs and reports of the imaging studies  CBC: Recent Labs  Lab 05/10/22 2203 05/11/22 0931 05/12/22 0311 05/13/22 0439  WBC 16.5* 16.0* 15.1* 17.0*  NEUTROABS 13.6*  --   --   --   HGB 7.4* 7.2* 6.4* 8.4*  HCT 25.2* 24.8* 21.6* 27.5*  MCV 74.8* 75.6* 76.3* 76.2*  PLT 550* 493* 479* 519*     Basic Metabolic Panel: Recent Labs  Lab 05/10/22 2203 05/11/22 0931 05/12/22 0311 05/13/22 0439  NA 139 135 135 134*  K 3.1* 3.5 3.3* 4.2  CL 110 109 106 104  CO2 21* 19* 20* 23  GLUCOSE 97 84 93 90  BUN 7 <5* <5* <5*  CREATININE 0.39* 0.40* 0.39* 0.40*  CALCIUM 8.0* 7.8* 7.9* 8.3*  MG 1.7  --  1.7  --   PHOS 2.6  --   --   --      GFR: Estimated Creatinine Clearance: 92.4 mL/min (A) (by C-G formula based on SCr of 0.4 mg/dL (L)).  Liver Function Tests: Recent Labs  Lab 05/10/22 2203 05/11/22 0931  AST 36 26  ALT 30 26  ALKPHOS 88 86  BILITOT 0.4 0.8  PROT 7.7 7.3  ALBUMIN <1.5* <1.5*      Coagulation Profile: Recent Labs  Lab 05/10/22 2203  INR 1.2      Thyroid Function Tests: Recent Labs    05/10/22 2203  TSH 1.689     Anemia Panel: Recent Labs     05/10/22 2203  VITAMINB12 825  FOLATE 7.6  FERRITIN 240  TIBC 115*  IRON 15*  RETICCTPCT 1.9     Recent Results (from the past 240 hour(s))  Aerobic/Anaerobic Culture w Gram Stain (surgical/deep wound)     Status: None   Collection Time: 05/04/22  2:05 PM   Specimen: Soft Tissue, Other  Result Value Ref Range Status   Specimen Description TISSUE LEFT ANKLE  Final   Special Requests NONE  Final   Gram Stain NO WBC SEEN NO ORGANISMS SEEN   Final   Culture   Final    RARE PROVIDENCIA STUARTII RARE PSEUDOMONAS AERUGINOSA RARE BACTEROIDES FRAGILIS BETA LACTAMASE NEGATIVE Performed at [facility], [place] [address] [place], [place] [address]    Report Status 05/07/2022 FINAL  Final   Organism ID, Bacteria PROVIDENCIA STUARTII  Final   Organism ID, Bacteria PSEUDOMONAS AERUGINOSA  Final      Susceptibility   Pseudomonas aeruginosa - MIC*    CEFTAZIDIME 4 SENSITIVE Sensitive     CIPROFLOXACIN <=0.25 SENSITIVE Sensitive     GENTAMICIN <=1 SENSITIVE Sensitive     IMIPENEM 2 SENSITIVE Sensitive     PIP/TAZO 8 SENSITIVE Sensitive     CEFEPIME 2 SENSITIVE Sensitive     * RARE PSEUDOMONAS AERUGINOSA   Providencia stuartii - MIC*    AMPICILLIN RESISTANT Resistant     CEFEPIME <=0.12 SENSITIVE Sensitive     CEFTAZIDIME <=1 SENSITIVE Sensitive     CEFTRIAXONE <=0.25 SENSITIVE Sensitive     CIPROFLOXACIN <=0.25 SENSITIVE Sensitive     GENTAMICIN RESISTANT Resistant     IMIPENEM 2 SENSITIVE Sensitive     TRIMETH/SULFA <=20 SENSITIVE Sensitive     AMPICILLIN/SULBACTAM 16 INTERMEDIATE Intermediate     PIP/TAZO <=4 SENSITIVE Sensitive     * RARE PROVIDENCIA STUARTII  MRSA Next Gen by PCR, Nasal     Status: None   Collection Time: 05/10/22 11:15 PM   Specimen: Nasal Mucosa; Nasal Swab  Result Value Ref Range Status   MRSA by PCR Next Gen NOT DETECTED NOT DETECTED Final    Comment: (NOTE) The GeneXpert MRSA Assay (FDA approved for NASAL specimens only), is one component of a  comprehensive MRSA colonization surveillance program. It is not intended to diagnose MRSA  infection nor to guide or monitor treatment for MRSA infections. Test performance is not FDA approved in patients less than 59 years old. Performed at [facility], [place] [address] [place], [place] [address]       Radiology Studies: No results found.     LOS: 3 days   Ifrah Vest [employer] on [email or web address]  05/13/2022, 8:50 AM

## 2022-05-14 ENCOUNTER — Ambulatory Visit: Payer: Medicaid Other | Admitting: Orthopedic Surgery

## 2022-05-14 DIAGNOSIS — M86152 Other acute osteomyelitis, left femur: Secondary | ICD-10-CM | POA: Diagnosis not present

## 2022-05-14 DIAGNOSIS — M869 Osteomyelitis, unspecified: Secondary | ICD-10-CM | POA: Diagnosis not present

## 2022-05-14 LAB — HEMOGLOBIN A1C
Hgb A1c MFr Bld: 5 % (ref 4.8–5.6)
Mean Plasma Glucose: 97 mg/dL

## 2022-05-14 NOTE — Consult Note (Signed)
ORTHOPAEDIC CONSULTATION  REQUESTING PHYSICIAN: Bonnielee Haff, MD  Chief Complaint: Decubitus ulcer left hip.  HPI: Celvin Para is a 37 y.o. male who presents with decubitus ulcer left hip.  Patient has recently been seen for a new abscess left ankle.  This was debrided tissue graft applied and a wound VAC applied.  The wound VAC has expired.  Past Medical History:  Diagnosis Date   Acne 05/01/2013   Anemia    Asthma    Neuroblastoma ([place])    @ 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    Neurogenic bladder    has no control, occasional cath   Paraplegia [facility])    Tumor T4 @ age 62month had surgery   Pneumonia    Tachycardia    Past Surgical History:  [place]ality Date   [place]  I & D EXTREMITY Left 06/02/2021   Procedure: EXCISION LEFT DISTAL FIBULA WOUND CLOSURE, PIN PLACEMENT TO STABILIZE ANKLE;  Surgeon: DNewt Minion MD;  [place]  Service: Orthopedics;  Laterality: Left;   I & D EXTREMITY Left 05/04/2022   Procedure: DEBRIDEMENT LEFT ANKLE;  Surgeon: DNewt Minion MD;  [place]  Service: Orthopedics;  Laterality: Left;   SPINAL FUSION  1993   SPINE SURGERY     89monthand 6 years   Social History   Socioeconomic History   Marital status: Single    Spouse name: Not on file   Number of children: 0   Years of education: Not on file   Highest education level: Not on file  Occupational History   Occupation: disabled  Tobacco Use   Smoking status: Former   Smokeless tobacco: Never   Tobacco comments:    << 1 ppd   Vaping Use   Vaping Use: Never used  Substance and Sexual Activity   Alcohol use: Yes    Alcohol/week: 2.0 standard drinks of alcohol    Types: 2 Standard drinks or equivalent per week    Comment: socially    Drug use: Yes    Types: Marijuana    Comment: Smokes Marihuana, denies others   Sexual activity: Never  Other Topics Concern   Not on file  Social History Narrative   lives  by himself    finished college 2007, finish masters degree 07-2009, [place]       Social Determinants of Health   Financial Resource Strain: Not on file  Food Insecurity: No Food Insecurity (05/05/2022)   Hunger Vital Sign    Worried About Running Out of Food in the Last Year: Never true    Ran Out of Food in the Last Year: Never true  Transportation Needs: No Transportation Needs (05/11/2022)   PRAPARE - TrHydrologistMedical): No    Lack of Transportation (Non-Medical): No  Physical Activity: Not on file  Stress: Not on file  Social Connections: Not on file   Family History  Problem Relation Age of Onset   Diabetes Father    Colon cancer Neg Hx    Prostate cancer Neg Hx    CAD Neg Hx    - negative except otherwise stated in the family history section No Known Allergies Prior to Admission medications   Medication Sig Start Date End Date Taking? Authorizing Provider  Cyanocobalamin (  VITAMIN B-12 PO) Take 5 mLs by mouth daily.   Yes [provider]  doxycycline (VIBRA-TABS) 100 MG tablet Take 1 tablet (100 mg total) by mouth 2 (two) times daily. 05/05/22  Yes Newt Minion, MD  ferrous sulfate 325 (65 FE) MG tablet Take 1 tablet (325 mg total) by mouth daily. 06/05/21 06/05/22 Yes Shelly Coss, MD  guaiFENesin (MUCINEX) 600 MG 12 hr tablet Take 600 mg by mouth 2 (two) times daily as needed for cough (congestion).   Yes [provider]  ciprofloxacin (CIPRO) 500 MG tablet Take 1 tablet (500 mg total) by mouth 2 (two) times daily. Patient not taking: Reported on 05/11/2022 05/06/22   Newt Minion, MD   No results found. - pertinent xrays, CT, MRI studies were reviewed and independently interpreted  Positive ROS: All other systems have been reviewed and were otherwise negative with the exception of those mentioned in the HPI and as above.  Physical Exam: General:  Alert, no acute distress Psychiatric: Patient is competent for consent with normal mood and affect Lymphatic: No axillary or cervical lymphadenopathy Cardiovascular: No pedal edema Respiratory: No cyanosis, no use of accessory musculature GI: No organomegaly, abdomen is soft and non-tender    Images:  '@ENCIMAGES'$ @  Labs:  Lab Results  Component Value Date   ESRSEDRATE 138 (H) 05/11/2022   ESRSEDRATE 59 (H) 06/01/2021   CRP 15.1 (H) 05/11/2022   CRP 12.0 (H) 06/01/2021   REPTSTATUS 05/07/2022 FINAL 05/04/2022   GRAMSTAIN NO WBC SEEN NO ORGANISMS SEEN  05/04/2022   CULT  05/04/2022    RARE PROVIDENCIA STUARTII RARE PSEUDOMONAS AERUGINOSA RARE BACTEROIDES FRAGILIS BETA LACTAMASE NEGATIVE Performed at [facility], [place] [address] [place],  [address]    LABORGA PROVIDENCIA STUARTII 05/04/2022   LABORGA PSEUDOMONAS AERUGINOSA 05/04/2022    Lab Results  Component Value Date   ALBUMIN <1.5 (L) 05/11/2022   ALBUMIN <1.5 (L) 05/10/2022   ALBUMIN <1.5 (L) 05/31/2021        Latest Ref Rng & Units 05/13/2022    4:39 AM 05/12/2022    3:11 AM 05/11/2022    9:31 AM  CBC EXTENDED  WBC 4.0 - 10.5 K/uL 17.0  15.1  16.0   RBC 4.22 - 5.81 MIL/uL 3.61  2.83  3.28   Hemoglobin 13.0 - 17.0 g/dL 8.4  6.4  C 7.2   HCT 39.0 - 52.0 % 27.5  21.6  24.8   Platelets 150 - 400 K/uL 519  479  493     C Corrected result    Neurologic: Patient does not have protective sensation bilateral lower extremities.   MUSCULOSKELETAL:   Skin: Examination patient has a large necrotic decubitus ulcer over the left hip.  The wound is 10 cm in diameter and 5 cm deep with necrotic tissue and foul-smelling drainage.  The ulcer does probe to the greater trochanter.  White cell count 17 hemoglobin 8.4.  Albumin less than 1.5.  Sed rate 138 C-reactive protein 15.1.  Review of the MRI scan shows osteomyelitis involving the greater trochanter, femoral neck, and proximal femur with ulcer extending  to bone.  No involvement of the acetabulum.  Assessment: Assessment: Decubitus left hip ulcer with osteomyelitis of the proximal femur.  Plan: Will plan for excisional debridement of the ulcer proximal femur resection with a Girdlestone amputation.  Risks and benefits were discussed including need for additional surgery.  Patient states he understands wished to proceed at this time.  Thank you for  the consult and the opportunity to see Mr. Daquane Faile, [place] [phone] 7:32 AM

## 2022-05-14 NOTE — Progress Notes (Signed)
[facility] PROGRESS NOTE   Phillip Toops [id number] DOB: January 23, 1986 DOA: 05/10/2022  PCP: Patient, No Pcp Per  Brief History/Interval Summary: 37 y.o. 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 Wheelchair-bound at baseline with chronic left hip wound. Recent left ankle I&D (05/04/22) for OM and ulcer and discharged on 05/05/22 with 30 day course of doxycycline due to negative culture. However, on 05/07/22 he was prescribed a 20 day course of cipro as cultures returned with (providencia stuartii, pseudomonas, and bacteroides).   He presented to his PCP office on 2/26 for follow up and WBC noted to be 22.7; (up from 2/23 was 24.8) and WBC on 2/29 at [facility] noted to be 16.7. He was referred to wound care outpatient, wound cultures were obtained (reportedly positive but cannot see organisms), and imaging ordered via xray of B/L hips. Appears he then presented to [facility] due to positive wound cultures and leukocytosis.   MRI left hip which reveled multiple findings subcutaneous abscess involving perineum, inner thigh. Wound noted in lateral hip measuring 4.6 x 5.3 cm with cellulitis involving gluteal muscle and osteomyelitis of greater trochanter. Case was discussed with Dr. Sharol Given who recommended transfer to [facility] for multi-disciplinary approach to likely include ortho, general surgery, ID, and possibly plastic surgery. He was started on vancomycin and Zosyn. CRP noted to be elevated at 155 which is actually slightly down from 178 on 26 February.   Consultants: Orthopedics.  General surgery.  Infectious disease.  Procedures: None yet    Subjective/Interval History: Patient denies any complaints.  Denies any pain issues.  Slept reasonably well.      Assessment/Plan:  Osteomyelitis of the greater trochanter/left hip open wound with draining abscess/myositis around the left hip Patient was on broad-spectrum antibiotics with vancomycin and Zosyn.  Infectious disease was consulted.  Antibiotics currently on hold in case patient needs to undergo surgery and if cultures are sent in order to increase the culture ER.  Seen by Dr. Sharol Given.  Plan is for surgical intervention as outlined in this note. Patient remains afebrile.  Leukocytosis has some chronicity to it. Recheck labs tomorrow. PICC line was placed due to limited venous access.  Perineal abscess along with the left perianal abscess Incidentally noted on MRI done in outside facility.  Seen by general surgery.  Findings were thought to be chronic based on their examination.  Most likely secondary to hidradenitis suppurativa.  They did aspirate a small amount from the perianal area.  They do not recommend any surgical intervention at this time.    Left ankle wound Underwent surgical intervention about a week ago. Cultures positive for Providencia, Pseudomonas and Bacteroides.  Patient was on ciprofloxacin prior to admission.   Has a wound VAC.  ID is following.  Further management per orthopedics.  Left kidney staghorn calculus with hydroureteronephrosis Renal function is normal.  Will likely need to be addressed by urology in the outpatient setting.  Hypotension Seems to be chronic based on review of old records.  He is asymptomatic.  Hypokalemia Recheck labs tomorrow.  Microcytic anemia Anemia panel reviewed.  No deficiencies identified.  Could be due to chronic disease.   Hemoglobin was 6.4 on 3/2.  1 unit of PRBC was ordered.  Recheck labs tomorrow. Minimize blood draws as much as possible.  Paraplegia This is chronic.  Also has a  history of neurogenic bladder.  DVT Prophylaxis: SCDs Code Status: Full code Family Communication: Discussed with patient Disposition Plan:  To be determined  Status is: Inpatient Remains inpatient appropriate because: Multiple abscesses, osteomyelitis    Medications: Scheduled:  sodium chloride   Intravenous Once   ascorbic acid  500 mg Oral Daily   Chlorhexidine Gluconate Cloth  6 each Topical Daily   dextromethorphan-guaiFENesin  1 tablet Oral BID   feeding supplement  237 mL Oral BID BM   multivitamin with minerals  1 tablet Oral Daily   mupirocin ointment   Topical BID   nutrition supplement (JUVEN)  1 packet Oral BID BM   sodium hypochlorite   Irrigation BID   zinc sulfate  220 mg Oral Daily   Continuous:   [id number] **OR** acetaminophen, HYDROcodone-acetaminophen, sodium chloride flush  Antibiotics: Anti-infectives (From admission, onward)    Start     Dose/Rate Route Frequency Ordered Stop   05/11/22 1815  vancomycin (VANCOREADY) IVPB 750 mg/150 mL  Status:  Discontinued        750 mg 150 mL/hr over 60 Minutes Intravenous Every 12 hours 05/11/22 1053 05/11/22 1354   05/11/22 1000  ceFEPIme (MAXIPIME) 2 g in sodium chloride 0.9 % 100 mL IVPB  Status:  Discontinued        2 g 200 mL/hr over 30 Minutes Intravenous Every 8 hours 05/11/22 0904 05/11/22 1354   05/11/22 1000  metroNIDAZOLE (FLAGYL) tablet 500 mg  Status:  Discontinued        500 mg Oral Every 12 hours 05/11/22 0904 05/11/22 1354   05/10/22 2230  piperacillin-tazobactam (ZOSYN) IVPB 3.375 g  Status:  Discontinued        3.375 g 12.5 mL/hr over 240 Minutes Intravenous Every 8 hours 05/10/22 2134 05/11/22 0904   05/10/22 2230  vancomycin (VANCOCIN) IVPB 1000 mg/200 mL premix  Status:  Discontinued        1,000 mg 200 mL/hr over 60 Minutes Intravenous Every 8 hours 05/10/22 2136 05/11/22 1053       Objective:  Vital Signs  Vitals:   05/13/22 2034 05/14/22 0421 05/14/22 0647 05/14/22 0818  BP: (!) 97/53 (!) 95/47 113/65 (!) 101/57  Pulse: (!) 112 (!) 109 (!) 112 (!) 104  Resp: '18 17  18  '$ Temp: 98.9 F (37.2 C) 98.5 F (36.9 C)   98.1 F (36.7 C)  TempSrc: Oral Oral  Oral  SpO2: 100% 100%  100%   No intake or output data in the 24 hours ending 05/14/22 0902  There were no vitals filed for this visit.   General appearance: Awake alert.  In no distress Resp: Clear to auscultation bilaterally.  Normal effort Cardio: S1-S2 is normal regular.  No S3-S4.  No rubs murmurs or bruit GI: Abdomen is soft.  Nontender nondistended.  Bowel sounds are present normal.  No masses organomegaly   Lab Results:  Data Reviewed: I have personally reviewed following labs and reports of the imaging studies  CBC: Recent Labs  Lab 05/10/22 2203 05/11/22 0931 05/12/22 0311 05/13/22 0439  WBC 16.5* 16.0* 15.1* 17.0*  NEUTROABS 13.6*  --   --   --   HGB 7.4* 7.2* 6.4* 8.4*  HCT 25.2* 24.8* 21.6* 27.5*  MCV 74.8* 75.6* 76.3* 76.2*  PLT 550* 493* 479* 519*     Basic Metabolic Panel: Recent Labs  Lab 05/10/22 2203 05/11/22 0931 05/12/22 0311 05/13/22 0439  NA 139 135 135 134*  K 3.1* 3.5 3.3* 4.2  CL  110 109 106 104  CO2 21* 19* 20* 23  GLUCOSE 97 84 93 90  BUN 7 <5* <5* <5*  CREATININE 0.39* 0.40* 0.39* 0.40*  CALCIUM 8.0* 7.8* 7.9* 8.3*  MG 1.7  --  1.7  --   PHOS 2.6  --   --   --      GFR: Estimated Creatinine Clearance: 92.4 mL/min (A) (by C-G formula based on SCr of 0.4 mg/dL (L)).  Liver Function Tests: Recent Labs  Lab 05/10/22 2203 05/11/22 0931  AST 36 26  ALT 30 26  ALKPHOS 88 86  BILITOT 0.4 0.8  PROT 7.7 7.3  ALBUMIN <1.5* <1.5*      Coagulation Profile: Recent Labs  Lab 05/10/22 2203  INR 1.2      Recent Results (from the past 240 hour(s))  Aerobic/Anaerobic Culture w Gram Stain (surgical/deep wound)     Status: None   Collection Time: 05/04/22  2:05 PM   Specimen: Soft Tissue, Other  Result Value Ref Range Status   Specimen Description TISSUE LEFT ANKLE  Final   Special Requests NONE  Final   Gram Stain NO WBC SEEN NO ORGANISMS SEEN   Final   Culture   Final    RARE  PROVIDENCIA STUARTII RARE PSEUDOMONAS AERUGINOSA RARE BACTEROIDES FRAGILIS BETA LACTAMASE NEGATIVE Performed at [facility], [place] [address] [place], [place] [address]    Report Status 05/07/2022 FINAL  Final   Organism ID, Bacteria PROVIDENCIA STUARTII  Final   Organism ID, Bacteria PSEUDOMONAS AERUGINOSA  Final      Susceptibility   Pseudomonas aeruginosa - MIC*    CEFTAZIDIME 4 SENSITIVE Sensitive     CIPROFLOXACIN <=0.25 SENSITIVE Sensitive     GENTAMICIN <=1 SENSITIVE Sensitive     IMIPENEM 2 SENSITIVE Sensitive     PIP/TAZO 8 SENSITIVE Sensitive     CEFEPIME 2 SENSITIVE Sensitive     * RARE PSEUDOMONAS AERUGINOSA   Providencia stuartii - MIC*    AMPICILLIN RESISTANT Resistant     CEFEPIME <=0.12 SENSITIVE Sensitive     CEFTAZIDIME <=1 SENSITIVE Sensitive     CEFTRIAXONE <=0.25 SENSITIVE Sensitive     CIPROFLOXACIN <=0.25 SENSITIVE Sensitive     GENTAMICIN RESISTANT Resistant     IMIPENEM 2 SENSITIVE Sensitive     TRIMETH/SULFA <=20 SENSITIVE Sensitive     AMPICILLIN/SULBACTAM 16 INTERMEDIATE Intermediate     PIP/TAZO <=4 SENSITIVE Sensitive     * RARE PROVIDENCIA STUARTII  MRSA Next Gen by PCR, Nasal     Status: None   Collection Time: 05/10/22 11:15 PM   Specimen: Nasal Mucosa; Nasal Swab  Result Value Ref Range Status   MRSA by PCR Next Gen NOT DETECTED NOT DETECTED Final    Comment: (NOTE) The GeneXpert MRSA Assay (FDA approved for NASAL specimens only), is one component of a comprehensive MRSA colonization surveillance program. It is not intended to diagnose MRSA infection nor to guide or monitor treatment for MRSA infections. Test performance is not FDA approved in patients less than 56 years old. Performed at [facility], [place] [address] [place], [place] [address]       Radiology Studies: No results found.     LOS: 4 days   Briyanna Billingham [employer] on [email or web address]  05/14/2022, 9:02 AM

## 2022-05-14 NOTE — Progress Notes (Signed)
Occupational Therapy Treatment Patient Details Name: Lee Reilly MRN: [id number] DOB: Nov 27, 1985 Today's Date: 05/14/2022   History of present illness Pt is a 37 y.o. male s/p I&D L ankle ulcer 2/23 and then d/c home with wound vac. 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   OT comments  Reviewed reasons to perform skin checks with patient and he was able to provide 3 reasons.  Patient without mirror at this time and perform skin checks using phone and rolling side to side.  Discussed bowel and bladder program and patient stated difficulty with digital stim and asked about alternatives to assist. OT to acquire mirror to assist with skin checks for next visit. Acute OT to continue to follow to further address performing skin checks and bowel and bladder program.    Recommendations for follow up therapy are one component of a multi-disciplinary discharge planning process, led by the attending physician.  Recommendations may be updated based on patient status, additional functional criteria and insurance authorization.    Follow Up Recommendations  Home health OT     Assistance Recommended at Discharge Intermittent Supervision/Assistance  Patient can return home with the following  Other (comment) (assist wtih skin care, bowel, bladder, and toileting routine)   Equipment Recommendations  Hospital bed;Other (comment) (mattress for pressure ulcers)    Recommendations for Other Services      Precautions / Restrictions Precautions Precautions: Other (comment) Precaution Comments: wound vac L foot, large ulcer L hip Restrictions Weight Bearing Restrictions: Yes LLE Weight Bearing: Weight bearing as tolerated       Mobility Bed Mobility Overal bed  mobility: Modified Independent                  Transfers                   General transfer comment: not addressed     Balance                                           ADL either performed or assessed with clinical judgement   ADL Overall ADL's : At baseline                                       General ADL Comments: Pt at baseline but feel pt's baseline is not adequate for taking care of his bowel, bladder and skin care.    Extremity/Trunk Assessment              Vision       Perception     Praxis      Cognition Arousal/Alertness: Awake/alert Behavior During Therapy: WFL for tasks assessed/performed Overall Cognitive Status: Within Functional Limits for tasks assessed                                          Exercises      Shoulder Instructions       General Comments Reviewed reasons to perform skin  checks and patient able to provided 3 reasons. Skins checks performed with use of phone as mirror due to no mirror at this time and patient states he has used [employer] in past.    Pertinent Vitals/ Pain       Pain Assessment Pain Assessment: No/denies pain  Home Living                                          Prior Functioning/Environment              Frequency  Min 2X/week        Progress Toward Goals  OT Goals(current goals can now be found in the care plan section)  Progress towards OT goals: Progressing toward goals  Acute Rehab OT Goals Patient Stated Goal: go home OT Goal Formulation: With patient Time For Goal Achievement: 05/25/22 Potential to Achieve Goals: Good ADL Goals Additional ADL Goal #1: Pt will demonstrate ability to check his skin from head to toe in bed with skin check mirror independently. Additional ADL Goal #2: Pt with nursing and OT will discuss and speak of advantages of getting pt on a bowel and bladder program which will in turn  assist with pts skin care. Additional ADL Goal #3: Pt will state 3 reasons why it is important to check his skin head to toe at least two times daily.  Plan Discharge plan remains appropriate    Co-evaluation                 AM-PAC OT "6 Clicks" Daily Activity     Outcome Measure   Help from another person eating meals?: None Help from another person taking care of personal grooming?: None Help from another person toileting, which includes using toliet, bedpan, or urinal?: Total Help from another person bathing (including washing, rinsing, drying)?: None Help from another person to put on and taking off regular upper body clothing?: None Help from another person to put on and taking off regular lower body clothing?: None 6 Click Score: 21    End of Session    OT Visit Diagnosis: Other (comment) (skin care, bowel and bladder needs)   Activity Tolerance Patient tolerated treatment well   Patient Left in bed;with call bell/phone within reach   Nurse Communication Mobility status        Time: [id number] OT Time Calculation (min): 15 min  Charges: OT General Charges $OT Visit: 1 Visit OT Treatments $Therapeutic Activity: 8-22 mins  Lodema Hong, [place]  Office [place] 05/14/2022, 2:31 PM

## 2022-05-14 NOTE — Progress Notes (Signed)
Brief ID note:  Patient's chart reviewed from over the weekend.  No acute events appear to be noted.  He remains afebrile with stable leukocytosis.  He had a PICC line that has been placed in his right arm.  He was seen by Dr. Sharol Given earlier today who is planning for excisional debridement of the ulcer and proximal femur resection with a Girdlestone amputation.  We will hold antibiotics pending this surgical intervention given his clinical stability.   Raynelle Highland for Infectious Disease [facility] 05/14/2022, 1:37 PM

## 2022-05-14 NOTE — H&P (View-Only) (Signed)
ORTHOPAEDIC CONSULTATION  REQUESTING PHYSICIAN: Bonnielee Haff, MD  Chief Complaint: Decubitus ulcer left hip.  HPI: Lee Reilly is a 37 y.o. male who presents with decubitus ulcer left hip.  Patient has recently been seen for a new abscess left ankle.  This was debrided tissue graft applied and a wound VAC applied.  The wound VAC has expired.  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    Neurogenic bladder    has no control, occasional cath   Paraplegia [facility])    Tumor T4 @ age 74month had surgery   Pneumonia    Tachycardia    Past Surgical History:  Procedure Laterality Date   [place]  2015   [place]   [place]  I & D EXTREMITY Left 06/02/2021   Procedure: EXCISION LEFT DISTAL FIBULA WOUND CLOSURE, PIN PLACEMENT TO STABILIZE ANKLE;  Surgeon: DNewt Minion MD;  [facility]  Service: Orthopedics;  Laterality: Left;   I & D EXTREMITY Left 05/04/2022   Procedure: DEBRIDEMENT LEFT ANKLE;  Surgeon: DNewt Minion MD;  [place] [facility]  Service: Orthopedics;  Laterality: Left;   SPINAL FUSION  1993   SPINE SURGERY     89monthand 6 years   Social History   Socioeconomic History   Marital status: Single    Spouse name: Not on file   Number of children: 0   Years of education: Not on file   Highest education level: Not on file  Occupational History   Occupation: disabled  Tobacco Use   Smoking status: Former   Smokeless tobacco: Never   Tobacco comments:    << 1 ppd   Vaping Use   Vaping Use: Never used  Substance and Sexual Activity   Alcohol use: Yes    Alcohol/week: 2.0 standard drinks of alcohol    Types: 2 Standard drinks or equivalent per week    Comment: socially    Drug use: Yes    Types: Marijuana    Comment: Smokes Marihuana, denies others   Sexual activity: Never  Other Topics Concern   Not on file  Social History Narrative   lives  by himself    finished college 2007, finish masters degree 07-2009, [place]       Social Determinants of Health   Financial Resource Strain: Not on file  Food Insecurity: No Food Insecurity (05/05/2022)   Hunger Vital Sign    Worried About Running Out of Food in the Last Year: Never true    Ran Out of Food in the Last Year: Never true  Transportation Needs: No Transportation Needs (05/11/2022)   PRAPARE - TrHydrologistMedical): No    Lack of Transportation (Non-Medical): No  Physical Activity: Not on file  Stress: Not on file  Social Connections: Not on file   Family History  Problem Relation Age of Onset   Diabetes Father    Colon cancer Neg Hx    Prostate cancer Neg Hx    CAD Neg Hx    - negative except otherwise stated in the family history section No Known Allergies Prior to Admission medications   Medication Sig Start Date End Date Taking? Authorizing Provider  Cyanocobalamin (  VITAMIN B-12 PO) Take 5 mLs by mouth daily.   Yes [provider]  doxycycline (VIBRA-TABS) 100 MG tablet Take 1 tablet (100 mg total) by mouth 2 (two) times daily. 05/05/22  Yes Newt Minion, MD  ferrous sulfate 325 (65 FE) MG tablet Take 1 tablet (325 mg total) by mouth daily. 06/05/21 06/05/22 Yes Shelly Coss, MD  guaiFENesin (MUCINEX) 600 MG 12 hr tablet Take 600 mg by mouth 2 (two) times daily as needed for cough (congestion).   Yes [provider]  ciprofloxacin (CIPRO) 500 MG tablet Take 1 tablet (500 mg total) by mouth 2 (two) times daily. Patient not taking: Reported on 05/11/2022 05/06/22   Newt Minion, MD   No results found. - pertinent xrays, CT, MRI studies were reviewed and independently interpreted  Positive ROS: All other systems have been reviewed and were otherwise negative with the exception of those mentioned in the HPI and as above.  Physical Exam: General:  Alert, no acute distress Psychiatric: Patient is competent for consent with normal mood and affect Lymphatic: No axillary or cervical lymphadenopathy Cardiovascular: No pedal edema Respiratory: No cyanosis, no use of accessory musculature GI: No organomegaly, abdomen is soft and non-tender    Images:  '@ENCIMAGES'$ @  Labs:  Lab Results  Component Value Date   ESRSEDRATE 138 (H) 05/11/2022   ESRSEDRATE 59 (H) 06/01/2021   CRP 15.1 (H) 05/11/2022   CRP 12.0 (H) 06/01/2021   REPTSTATUS 05/07/2022 FINAL 05/04/2022   GRAMSTAIN NO WBC SEEN NO ORGANISMS SEEN  05/04/2022   CULT  05/04/2022    RARE PROVIDENCIA STUARTII RARE PSEUDOMONAS AERUGINOSA RARE BACTEROIDES FRAGILIS BETA LACTAMASE NEGATIVE Performed at [facility], [place] [address], [place], [place] [address]    LABORGA PROVIDENCIA STUARTII 05/04/2022   LABORGA PSEUDOMONAS AERUGINOSA 05/04/2022    Lab Results  Component Value Date   ALBUMIN <1.5 (L) 05/11/2022   ALBUMIN <1.5 (L) 05/10/2022   ALBUMIN <1.5 (L) 05/31/2021        Latest Ref Rng & Units 05/13/2022    4:39 AM 05/12/2022    3:11 AM 05/11/2022    9:31 AM  CBC EXTENDED  WBC 4.0 - 10.5 K/uL 17.0  15.1  16.0   RBC 4.22 - 5.81 MIL/uL 3.61  2.83  3.28   Hemoglobin 13.0 - 17.0 g/dL 8.4  6.4  C 7.2   HCT 39.0 - 52.0 % 27.5  21.6  24.8   Platelets 150 - 400 K/uL 519  479  493     C Corrected result    Neurologic: Patient does not have protective sensation bilateral lower extremities.   MUSCULOSKELETAL:   Skin: Examination patient has a large necrotic decubitus ulcer over the left hip.  The wound is 10 cm in diameter and 5 cm deep with necrotic tissue and foul-smelling drainage.  The ulcer does probe to the greater trochanter.  White cell count 17 hemoglobin 8.4.  Albumin less than 1.5.  Sed rate 138 C-reactive protein 15.1.  Review of the MRI scan shows osteomyelitis involving the greater trochanter, femoral neck, and proximal femur with ulcer extending  to bone.  No involvement of the acetabulum.  Assessment: Assessment: Decubitus left hip ulcer with osteomyelitis of the proximal femur.  Plan: Will plan for excisional debridement of the ulcer proximal femur resection with a Girdlestone amputation.  Risks and benefits were discussed including need for additional surgery.  Patient states he understands wished to proceed at this time.  Thank you for  the consult and the opportunity to see Lee Reilly, [place] [phone] 7:32 AM

## 2022-05-14 NOTE — Evaluation (Signed)
Physical Therapy Evaluation Patient Details Name: Lee Reilly MRN: [id number] DOB: 1985-07-05 Today's Date: 05/14/2022  History of Present Illness  Pt is a 37 y.o. male s/p I&D L ankle ulcer 2/23 and then d/c home with wound vac. 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  Clinical Impression   Pt admitted secondary to problem above with deficits below. PTA patient was living alone and modified independent with his mobility. Patient remains modified independent with his mobility at wheelchair level, however would benefit from a new wheelchair cushion (if not already ordered with his new wheelchair that has been ordered) and an air mattress (both in hospital and at home).  Anticipate patient will benefit from PT to address problems listed below.Will continue to follow acutely to continue education on pressure relief techniques and assess pressure-relieving equipment pt needs.   NOTE-spoke with charge nurse and she is going to order an air mattress for patient.        Recommendations for follow up therapy are one component of a multi-disciplinary discharge planning process, led by the attending physician.  Recommendations may be updated based on patient status, additional functional criteria and insurance authorization.  Follow Up Recommendations No PT follow up      Assistance Recommended at Discharge PRN  Patient can return home with the following       Equipment Recommendations Other (comment) (air mattress (for hospital and home); gel wheelchair cushion)  Recommendations for Other Services       Functional Status Assessment Patient has had a recent decline in their functional status and demonstrates the ability to make significant improvements in  function in a reasonable and predictable amount of time. (integumentary function)     Precautions / Restrictions Precautions Precautions: Other (comment) Precaution Comments: wound vac L foot, large ulcer L hip/ischium Restrictions Weight Bearing Restrictions: No LLE Weight Bearing: Weight bearing as tolerated      Mobility  Bed Mobility Overal bed mobility: Modified Independent             General bed mobility comments: Pt has regular bed at home.  Due to skin issues, need to consider different mattress system at home if possible.    Transfers Overall transfer level: Modified independent Equipment used: None               General transfer comment: squat pivot bed to w/c to bed    Ambulation/Gait               General Gait Details: nonambulatory at baseline  Hotel manager mobility: Yes Wheelchair propulsion: Both upper extremities Wheelchair parts: Independent Distance: 400 Wheelchair Assistance Details (indicate cue type and reason): none needed; independent with all parts including taking off/putting on foot rests for transfers  Modified Rankin (Stroke Patients Only)       Balance Overall balance assessment: Modified Independent                                           Pertinent Vitals/Pain      Home Living Family/patient expects to be discharged to:: Private  residence Living Arrangements: Alone Available Help at Discharge: Family;Available 24 hours/day Type of Home: House Home Access: Level entry       Home Layout: One level Home Equipment: Tub bench;Grab bars - tub/shower;Wheelchair - manual Additional Comments: Pt reports he has a new w/c ordered. Unsure if he is to get a new cushion    Prior Function Prior Level of Function : Independent/Modified Independent;Driving;Working/employed             Mobility Comments: mod I at manual w/c level        Hand Dominance   Dominant Hand: Right    Extremity/Trunk Assessment   Upper Extremity Assessment Upper Extremity Assessment: Defer to OT evaluation    Lower Extremity Assessment RLE Deficits / Details: T4 complete paraplegic LLE Deficits / Details: T4 complete paraplegic    Cervical / Trunk Assessment Cervical / Trunk Assessment: Back Surgery;Other exceptions (scoliosis)  Communication   Communication: No difficulties  Cognition Arousal/Alertness: Awake/alert Behavior During Therapy: WFL for tasks assessed/performed Overall Cognitive Status: Within Functional Limits for tasks assessed                                          General Comments General comments (skin integrity, edema, etc.): Discussed pt's pressure relief schedule and he has not had one. He did not know how often pressure relief should even be completed ("once per day?"). Able to demonstrate full chair pushup to relieve area and educated needs to be done q 30 minutes and hold for 1-2 minutes. Pt on honeycomb cushion that he thinks he has had for a few years. He does not know if new cushion is being ordered with his new w/c. He will call his primary care doctor (who signed off on PT's w/c recommendation) to see if they have a cushion as part of the recommendation (pt does not remember who the PT was that did w/c evaluation). If MD does not know, hopefully they have record of who did the w/c evaluation so pt can follow-up re: possible new cushion. Educated pt that best position for pressure relief is prone and that sitting up in bed is putting pressure on the wound just the same as when sitting up in w/c.    Exercises     Assessment/Plan    PT Assessment Patient needs continued PT services  PT Problem List Decreased skin integrity       PT Treatment Interventions DME instruction;Patient/family education;Other (comment) (w/c cushion assessment; pressure relief education)    PT Goals (Current  goals can be found in the Care Plan section)  Acute Rehab PT Goals Patient Stated Goal: be better able to manage/care for his left ischial wound PT Goal Formulation: With patient Time For Goal Achievement: 05/28/22 Potential to Achieve Goals: Good    Frequency Min 2X/week     Co-evaluation               AM-PAC PT "6 Clicks" Mobility  Outcome Measure Help needed turning from your back to your side while in a flat bed without using bedrails?: None Help needed moving from lying on your back to sitting on the side of a flat bed without using bedrails?: None Help needed moving to and from a bed to a chair (including a wheelchair)?: None Help needed standing up from a chair using your arms (e.g., wheelchair or bedside chair)?: Total Help needed to  walk in hospital room?: Total Help needed climbing 3-5 steps with a railing? : Total 6 Click Score: 15    End of Session   Activity Tolerance: Patient tolerated treatment well Patient left: in bed;with call bell/phone within reach Nurse Communication: Mobility status PT Visit Diagnosis: Other (comment) (left ischial wound)    Time: [id number] PT Time Calculation (min) (ACUTE ONLY): 27 min   Charges:   PT Evaluation $PT Eval Low Complexity: 1 Low PT Treatments $Self Care/Home Management: [place]  Office [phone]   Rexanne Mano 05/14/2022, 4:04 PM

## 2022-05-15 ENCOUNTER — Other Ambulatory Visit: Payer: Self-pay

## 2022-05-15 ENCOUNTER — Encounter (HOSPITAL_COMMUNITY): Payer: Self-pay | Admitting: Internal Medicine

## 2022-05-15 LAB — BASIC METABOLIC PANEL
Anion gap: 9 (ref 5–15)
BUN: 7 mg/dL (ref 6–20)
CO2: 23 mmol/L (ref 22–32)
Calcium: 8.1 mg/dL — ABNORMAL LOW (ref 8.9–10.3)
Chloride: 100 mmol/L (ref 98–111)
Creatinine, Ser: 0.46 mg/dL — ABNORMAL LOW (ref 0.61–1.24)
GFR, Estimated: >60 mL/min (ref >60)
Glucose, Bld: 91 mg/dL (ref 70–99)
Potassium: 3.3 mmol/L — ABNORMAL LOW (ref 3.5–5.1)
Sodium: 132 mmol/L — ABNORMAL LOW (ref 135–145)

## 2022-05-15 LAB — CBC
HCT: 28.6 % — ABNORMAL LOW (ref 39.0–52.0)
Hemoglobin: 8.7 g/dL — ABNORMAL LOW (ref 13.0–17.0)
MCH: 23.5 pg — ABNORMAL LOW (ref 26.0–34.0)
MCHC: 30.4 g/dL (ref 30.0–36.0)
MCV: 77.1 fL — ABNORMAL LOW (ref 80.0–100.0)
Platelets: 485 10*3/uL — ABNORMAL HIGH (ref 150–400)
RBC: 3.71 MIL/uL — ABNORMAL LOW (ref 4.22–5.81)
RDW: 19.1 % — ABNORMAL HIGH (ref 11.5–15.5)
WBC: 15.4 10*3/uL — ABNORMAL HIGH (ref 4.0–10.5)
nRBC: 0 % (ref 0.0–0.2)

## 2022-05-15 LAB — MAGNESIUM: Magnesium: 2 mg/dL (ref 1.7–2.4)

## 2022-05-15 LAB — MRSA NEXT GEN BY PCR, NASAL: MRSA by PCR Next Gen: NOT DETECTED

## 2022-05-15 MED ORDER — CEFAZOLIN SODIUM-DEXTROSE 2-4 GM/100ML-% IV SOLN
2.0000 g | INTRAVENOUS | Status: AC
  Administered 2022-05-16: 2 g via INTRAVENOUS
  Filled 2022-05-15: qty 100

## 2022-05-15 MED ORDER — POVIDONE-IODINE 10 % EX SWAB: 2.0000 | Freq: Once | CUTANEOUS | Status: DC

## 2022-05-15 MED ORDER — CHLORHEXIDINE GLUCONATE 4 % EX LIQD: 60.0000 mL | Freq: Once | CUTANEOUS | Status: DC

## 2022-05-15 MED ORDER — POTASSIUM CHLORIDE 10 MEQ/100ML IV SOLN
10.0000 meq | INTRAVENOUS | Status: AC
  Administered 2022-05-15 (×4): 10 meq via INTRAVENOUS
  Filled 2022-05-15 (×4): qty 100

## 2022-05-15 NOTE — Progress Notes (Signed)
Physical Therapy Treatment Patient Details Name: Lee Reilly MRN: [id number] DOB: [birth year] Today's Date: 05/15/2022   History of Present Illness Pt is a 37 y.o. male s/p I&D L ankle ulcer 2/23 and then d/c home with wound vac. 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    PT Comments    Patient seen for follow-up re: air mattress and his request for padding the sharp edges of his foot rests. Patient still on foam mattress. Followed up with nursing secretary and she is ordering bed for pt. She did not know why it was not delivered. Foam tubing used to pad edges of foot rests to protect his feet as he frequently does not wear shoes and was concerned that he would cause a wound on either foot due to the sharp edges. Patient had not followed up with PCP to see if wheelchair prescription included a new cushion and, if so, what cushion.     Recommendations for follow up therapy are one component of a multi-disciplinary discharge planning process, led by the attending physician.  Recommendations may be updated based on patient status, additional functional criteria and insurance authorization.  Follow Up Recommendations  No PT follow up     Assistance Recommended at Discharge PRN  Patient can return home with the following     Equipment Recommendations  Other (comment) (air mattress (for hospital and home); gel wheelchair cushion)    Recommendations for Other Services       Precautions / Restrictions Precautions Precautions: Other (comment) Precaution Comments: wound vac L foot, large ulcer L hip/ischium Restrictions Weight Bearing Restrictions: No LLE Weight Bearing: Weight bearing as tolerated     Mobility  Bed Mobility Overal bed mobility:  Modified Independent             General bed mobility comments: pt still on regular foam mattress after PT got order input for air bed. Followed up with nursing secretary and she will order bed now.    Transfers                   General transfer comment: pt deferred as he is waiting to go to surgery at any time now    Ambulation/Gait               General Gait Details: nonambulatory at baseline   Geneticist, molecular Details (indicate cue type and reason): Pt had noted sharp edges on his foot rests and PT was able to cut foam tubing to fit bil foot rests. Tape was needed to keep tubing shaped around the corners and made pt aware he made need stronger tape (?duct tape) to adhere to foot rest.  Modified Rankin (Stroke Patients Only)       Balance                                            Cognition Arousal/Alertness: Awake/alert Behavior During Therapy: WFL for tasks assessed/performed Overall Cognitive Status: Within Functional Limits for tasks assessed  General Comments: Pt anxious about being in the hospital. Feel pt has not had nursing care that is needed for T4 para to address bowel, bladder and skin.        Exercises      General Comments General comments (skin integrity, edema, etc.): Patient did not remember to call PCP to see if w/c prescription included a new cushion. He still cannot recall name/office of PT that did the prescription. He thinks [employer] is the company that will provide the new chair.      Pertinent Vitals/Pain Pain Assessment Pain Assessment: No/denies pain    Home Living                          Prior Function            PT Goals (current goals can now be found in the care plan section) Acute Rehab PT Goals Patient Stated Goal: be better able to manage/care for his left  ischial wound PT Goal Formulation: With patient Time For Goal Achievement: 05/28/22 Potential to Achieve Goals: Good Progress towards PT goals: Progressing toward goals    Frequency    Min 2X/week      PT Plan Current plan remains appropriate    Co-evaluation              AM-PAC PT "6 Clicks" Mobility   Outcome Measure  Help needed turning from your back to your side while in a flat bed without using bedrails?: None Help needed moving from lying on your back to sitting on the side of a flat bed without using bedrails?: None Help needed moving to and from a bed to a chair (including a wheelchair)?: None Help needed standing up from a chair using your arms (e.g., wheelchair or bedside chair)?: Total Help needed to walk in hospital room?: Total Help needed climbing 3-5 steps with a railing? : Total 6 Click Score: 15    End of Session   Activity Tolerance: Patient tolerated treatment well Patient left: in bed;with call bell/phone within reach Nurse Communication: Other (comment) (pt did not get air bed; RN referred me to nursing secretary and she is placing the order) PT Visit Diagnosis: Other (comment) (left ischial wound)     Time: 1540-1616 PT Time Calculation (min) (ACUTE ONLY): 36 min  Charges:  $Wheel Chair Management: 23-37 mins                      Arby Barrette, PT Acute Rehabilitation Services  Office [phone]    Rexanne Mano 05/15/2022, 4:29 PM

## 2022-05-15 NOTE — Progress Notes (Signed)
[facility] PROGRESS NOTE   Lee Reilly [id number] DOB: 1986/01/14 DOA: 05/10/2022  PCP: Patient, No Pcp Per  Brief History/Interval Summary: 37 y.o. 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 Wheelchair-bound at baseline with chronic left hip wound. Recent left ankle I&D (05/04/22) for OM and ulcer and discharged on 05/05/22 with 30 day course of doxycycline due to negative culture. However, on 05/07/22 he was prescribed a 20 day course of cipro as cultures returned with (providencia stuartii, pseudomonas, and bacteroides).   He presented to his PCP office on 2/26 for follow up and WBC noted to be 22.7; (up from 2/23 was 24.8) and WBC on 2/29 at [place] [facility] noted to be 16.7. He was referred to wound care outpatient, wound cultures were obtained (reportedly positive but cannot see organisms), and imaging ordered via xray of B/L hips. Appears he then presented to [place] [facility] due to positive wound cultures and leukocytosis.   MRI left hip which reveled multiple findings subcutaneous abscess involving perineum, inner thigh. Wound noted in lateral hip measuring 4.6 x 5.3 cm with cellulitis involving gluteal muscle and osteomyelitis of greater trochanter. Case was discussed with Dr. Sharol Given who recommended transfer to [facility] for multi-disciplinary approach to likely include ortho, general surgery, ID, and possibly plastic surgery. He was started on vancomycin and Zosyn. CRP noted to be elevated at 155 which is actually slightly down from 178 on 26 February.   Consultants: Orthopedics.  General surgery.  Infectious disease.  Procedures: None yet    Subjective/Interval History: Patient slept well overnight.  Denies any complaints this morning.  Looking forward to his procedure later  today.     Assessment/Plan:  Osteomyelitis of the greater trochanter/left hip open wound with draining abscess/myositis around the left hip Patient was on broad-spectrum antibiotics with vancomycin and Zosyn.  Infectious disease was consulted.  Antibiotics currently on hold.  Seen by Dr. Sharol Given.  Plan is for surgical intervention as outlined in his note.  Plan is for surgery.   Patient remains afebrile.  Elevated WBCs noted but also noted to be somewhat chronic, and previous labs were reviewed. PICC line was placed due to limited venous access.  Perineal abscess along with the left perianal abscess Incidentally noted on MRI done in outside facility.  Seen by general surgery.  Findings were thought to be chronic based on their examination.  Most likely secondary to hidradenitis suppurativa.  They did aspirate a small amount from the perianal area.  They do not recommend any surgical intervention at this time.    Left ankle wound Underwent surgical intervention about a week ago. Cultures positive for Providencia, Pseudomonas and Bacteroides.  Patient was on ciprofloxacin prior to admission.   Has a wound VAC.  ID is following.  Antibiotics currently on hold.  Further management per orthopedics.  Left kidney staghorn calculus with hydroureteronephrosis Renal function is normal.  Will likely need to be addressed by urology in the outpatient setting.  Hypotension Seems to be chronic based on review of old records.  He is asymptomatic.  Hypokalemia Will replace potassium.  Check magnesium.  Microcytic anemia Anemia panel reviewed.  No deficiencies identified.  Could be due to chronic disease.   Hemoglobin was 6.4 on 3/2.  1 unit of PRBC was ordered.  Hemoglobin has responded and has been stable for the last 2 days.  Minimize blood draws as much as possible.  Paraplegia This is chronic.  Also has a history of neurogenic bladder but is able to void on his own.  DVT Prophylaxis: SCDs Code  Status: Full code Family Communication: Discussed with patient Disposition Plan: To be determined.  Lives by himself but his mother is available to help him.  Status is: Inpatient Remains inpatient appropriate because: Multiple abscesses, osteomyelitis    Medications: Scheduled:  sodium chloride   Intravenous Once   ascorbic acid  500 mg Oral Daily   Chlorhexidine Gluconate Cloth  6 each Topical Daily   dextromethorphan-guaiFENesin  1 tablet Oral BID   feeding supplement  237 mL Oral BID BM   multivitamin with minerals  1 tablet Oral Daily   mupirocin ointment   Topical BID   nutrition supplement (JUVEN)  1 packet Oral BID BM   sodium hypochlorite   Irrigation BID   zinc sulfate  220 mg Oral Daily   Continuous:  potassium chloride      [id number] **OR** acetaminophen, HYDROcodone-acetaminophen, sodium chloride flush  Antibiotics: Anti-infectives (From admission, onward)    Start     Dose/Rate Route Frequency Ordered Stop   05/11/22 1815  vancomycin (VANCOREADY) IVPB 750 mg/150 mL  Status:  Discontinued        750 mg 150 mL/hr over 60 Minutes Intravenous Every 12 hours 05/11/22 1053 05/11/22 1354   05/11/22 1000  ceFEPIme (MAXIPIME) 2 g in sodium chloride 0.9 % 100 mL IVPB  Status:  Discontinued        2 g 200 mL/hr over 30 Minutes Intravenous Every 8 hours 05/11/22 0904 05/11/22 1354   05/11/22 1000  metroNIDAZOLE (FLAGYL) tablet 500 mg  Status:  Discontinued        500 mg Oral Every 12 hours 05/11/22 0904 05/11/22 1354   05/10/22 2230  piperacillin-tazobactam (ZOSYN) IVPB 3.375 g  Status:  Discontinued        3.375 g 12.5 mL/hr over 240 Minutes Intravenous Every 8 hours 05/10/22 2134 05/11/22 0904   05/10/22 2230  vancomycin (VANCOCIN) IVPB 1000 mg/200 mL premix  Status:  Discontinued        1,000 mg 200 mL/hr over 60 Minutes Intravenous Every 8 hours 05/10/22 2136 05/11/22 1053       Objective:  Vital Signs  Vitals:   05/14/22 1615 05/14/22 2012 05/15/22  0618 05/15/22 0744  BP: 101/62 (!) 102/58 102/63 101/63  Pulse: (!) 104 (!) 109 (!) 102 90  Resp: '18 17 17 18  '$ Temp: 98 F (36.7 C) 98.6 F (37 C) 98.2 F (36.8 C) 98 F (36.7 C)  TempSrc: Oral Oral Oral Oral  SpO2: 100% 100% 100% 100%   No intake or output data in the 24 hours ending 05/15/22 0821  There were no vitals filed for this visit.  General appearance: Awake alert.  In no distress Resp: Clear to auscultation bilaterally.  Normal effort Cardio: S1-S2 is normal regular.  No S3-S4.  No rubs murmurs or bruit GI: Abdomen is soft.  Nontender nondistended.  Bowel sounds are present normal.  No masses organomegaly Extremities: Wound VAC noted left ankle  Lab Results:  Data Reviewed: I have personally reviewed following labs and reports of the imaging studies  CBC: Recent Labs  Lab 05/10/22 2203 05/11/22 0931 05/12/22 0311 05/13/22 0439 05/15/22 0515  WBC 16.5* 16.0* 15.1* 17.0* 15.4*  NEUTROABS 13.6*  --   --   --   --   HGB 7.4* 7.2* 6.4*  8.4* 8.7*  HCT 25.2* 24.8* 21.6* 27.5* 28.6*  MCV 74.8* 75.6* 76.3* 76.2* 77.1*  PLT 550* 493* 479* 519* 485*     Basic Metabolic Panel: Recent Labs  Lab 05/10/22 2203 05/11/22 0931 05/12/22 0311 05/13/22 0439 05/15/22 0515  NA 139 135 135 134* 132*  K 3.1* 3.5 3.3* 4.2 3.3*  CL 110 109 106 104 100  CO2 21* 19* 20* 23 23  GLUCOSE 97 84 93 90 91  BUN 7 <5* <5* <5* 7  CREATININE 0.39* 0.40* 0.39* 0.40* 0.46*  CALCIUM 8.0* 7.8* 7.9* 8.3* 8.1*  MG 1.7  --  1.7  --   --   PHOS 2.6  --   --   --   --      GFR: Estimated Creatinine Clearance: 92.4 mL/min (A) (by C-G formula based on SCr of 0.46 mg/dL (L)).  Liver Function Tests: Recent Labs  Lab 05/10/22 2203 05/11/22 0931  AST 36 26  ALT 30 26  ALKPHOS 88 86  BILITOT 0.4 0.8  PROT 7.7 7.3  ALBUMIN <1.5* <1.5*      Coagulation Profile: Recent Labs  Lab 05/10/22 2203  INR 1.2      Recent Results (from the past 240 hour(s))  MRSA Next Gen by PCR,  Nasal     Status: None   Collection Time: 05/10/22 11:15 PM   Specimen: Nasal Mucosa; Nasal Swab  Result Value Ref Range Status   MRSA by PCR Next Gen NOT DETECTED NOT DETECTED Final    Comment: (NOTE) The GeneXpert MRSA Assay (FDA approved for NASAL specimens only), is one component of a comprehensive MRSA colonization surveillance program. It is not intended to diagnose MRSA infection nor to guide or monitor treatment for MRSA infections. Test performance is not FDA approved in patients less than 99 years old. Performed at [facility], [place] [address], [place], [place] [address]       Radiology Studies: No results found.     LOS: 5 days   Raileigh Sabater [employer] on [email or web address]  05/15/2022, 8:21 AM

## 2022-05-15 NOTE — Care Plan (Signed)
Patient AxOx4, reported fatigue because couldn't sleep last night. No pain reported, patient waiting surgery. K covered, patient was incontinent with urine, peri-care provided. VS WNL. Patient had an uneventful day. No PRN meds given, plan of care discussed with the patient.

## 2022-05-16 ENCOUNTER — Inpatient Hospital Stay (HOSPITAL_COMMUNITY): Payer: No Typology Code available for payment source | Admitting: Certified Registered"

## 2022-05-16 ENCOUNTER — Encounter (HOSPITAL_COMMUNITY)
Admission: AD | Disposition: A | Discharge status: Home / Self Care | Payer: Self-pay | Source: Ambulatory Visit | Attending: Internal Medicine

## 2022-05-16 ENCOUNTER — Other Ambulatory Visit: Payer: Self-pay

## 2022-05-16 ENCOUNTER — Encounter (HOSPITAL_COMMUNITY): Payer: Self-pay | Admitting: Internal Medicine

## 2022-05-16 DIAGNOSIS — D649 Anemia, unspecified: Secondary | ICD-10-CM

## 2022-05-16 DIAGNOSIS — G709 Myoneural disorder, unspecified: Secondary | ICD-10-CM

## 2022-05-16 DIAGNOSIS — M869 Osteomyelitis, unspecified: Secondary | ICD-10-CM

## 2022-05-16 DIAGNOSIS — Z87891 Personal history of nicotine dependence: Secondary | ICD-10-CM

## 2022-05-16 HISTORY — PX: GIRDLESTONE ARTHROPLASTY: SHX5550

## 2022-05-16 LAB — CBC
HCT: 28.3 % — ABNORMAL LOW (ref 39.0–52.0)
HCT: 23 % — ABNORMAL LOW (ref 39.0–52.0)
HCT: 22.2 % — ABNORMAL LOW (ref 39.0–52.0)
Hemoglobin: 8.6 g/dL — ABNORMAL LOW (ref 13.0–17.0)
Hemoglobin: 7 g/dL — ABNORMAL LOW (ref 13.0–17.0)
Hemoglobin: 7 g/dL — ABNORMAL LOW (ref 13.0–17.0)
MCH: 23.4 pg — ABNORMAL LOW (ref 26.0–34.0)
MCH: 23.8 pg — ABNORMAL LOW (ref 26.0–34.0)
MCH: 24.3 pg — ABNORMAL LOW (ref 26.0–34.0)
MCHC: 30.4 g/dL (ref 30.0–36.0)
MCHC: 30.4 g/dL (ref 30.0–36.0)
MCHC: 31.5 g/dL (ref 30.0–36.0)
MCV: 76.9 fL — ABNORMAL LOW (ref 80.0–100.0)
MCV: 78.2 fL — ABNORMAL LOW (ref 80.0–100.0)
MCV: 77.1 fL — ABNORMAL LOW (ref 80.0–100.0)
Platelets: 465 10*3/uL — ABNORMAL HIGH (ref 150–400)
Platelets: 452 10*3/uL — ABNORMAL HIGH (ref 150–400)
Platelets: 333 10*3/uL (ref 150–400)
RBC: 3.68 MIL/uL — ABNORMAL LOW (ref 4.22–5.81)
RBC: 2.94 MIL/uL — ABNORMAL LOW (ref 4.22–5.81)
RBC: 2.88 MIL/uL — ABNORMAL LOW (ref 4.22–5.81)
RDW: 19.2 % — ABNORMAL HIGH (ref 11.5–15.5)
RDW: 19 % — ABNORMAL HIGH (ref 11.5–15.5)
RDW: 18.5 % — ABNORMAL HIGH (ref 11.5–15.5)
WBC: 15.4 10*3/uL — ABNORMAL HIGH (ref 4.0–10.5)
WBC: 30.5 10*3/uL — ABNORMAL HIGH (ref 4.0–10.5)
WBC: 15.7 10*3/uL — ABNORMAL HIGH (ref 4.0–10.5)
nRBC: 0 % (ref 0.0–0.2)
nRBC: 0 % (ref 0.0–0.2)
nRBC: 0 % (ref 0.0–0.2)

## 2022-05-16 LAB — PREPARE RBC (CROSSMATCH)

## 2022-05-16 LAB — BASIC METABOLIC PANEL
Anion gap: 9 (ref 5–15)
BUN: 7 mg/dL (ref 6–20)
CO2: 25 mmol/L (ref 22–32)
Calcium: 8.1 mg/dL — ABNORMAL LOW (ref 8.9–10.3)
Chloride: 100 mmol/L (ref 98–111)
Creatinine, Ser: 0.41 mg/dL — ABNORMAL LOW (ref 0.61–1.24)
GFR, Estimated: >60 mL/min (ref >60)
Glucose, Bld: 98 mg/dL (ref 70–99)
Potassium: 3.1 mmol/L — ABNORMAL LOW (ref 3.5–5.1)
Sodium: 134 mmol/L — ABNORMAL LOW (ref 135–145)

## 2022-05-16 LAB — MAGNESIUM: Magnesium: 2 mg/dL (ref 1.7–2.4)

## 2022-05-16 SURGERY — ARTHROPLASTY, HIP, GIRDLESTONE
Anesthesia: General | Site: Hip | Laterality: Left

## 2022-05-16 MED ORDER — PHENYLEPHRINE HCL (PRESSORS) 10 MG/ML IV SOLN
INTRAVENOUS | Status: AC
  Filled 2022-05-16: qty 1

## 2022-05-16 MED ORDER — MAGNESIUM CITRATE PO SOLN: 1.0000 | Freq: Once | ORAL | Status: DC | PRN

## 2022-05-16 MED ORDER — PANTOPRAZOLE SODIUM 40 MG PO TBEC
40.0000 mg | DELAYED_RELEASE_TABLET | Freq: Every day | ORAL | Status: DC
  Administered 2022-05-16 – 2022-05-18 (×3): 40 mg via ORAL
  Filled 2022-05-16 (×3): qty 1

## 2022-05-16 MED ORDER — VITAMIN C 500 MG PO TABS
1000.0000 mg | ORAL_TABLET | Freq: Every day | ORAL | Status: DC
  Administered 2022-05-16 – 2022-05-18 (×3): 1000 mg via ORAL
  Filled 2022-05-16 (×3): qty 2

## 2022-05-16 MED ORDER — HYDROMORPHONE HCL 1 MG/ML IJ SOLN: 0.5000 mg | INTRAMUSCULAR | Status: DC | PRN

## 2022-05-16 MED ORDER — PROPOFOL 10 MG/ML IV BOLUS
INTRAVENOUS | Status: AC
  Filled 2022-05-16: qty 20

## 2022-05-16 MED ORDER — ALBUMIN HUMAN 5 % IV SOLN: INTRAVENOUS | Status: DC | PRN

## 2022-05-16 MED ORDER — OXYCODONE HCL 5 MG PO TABS: 5.0000 mg | ORAL_TABLET | ORAL | Status: DC | PRN

## 2022-05-16 MED ORDER — DOCUSATE SODIUM 100 MG PO CAPS
100.0000 mg | ORAL_CAPSULE | Freq: Every day | ORAL | Status: DC
  Filled 2022-05-16: qty 1

## 2022-05-16 MED ORDER — PROPOFOL 10 MG/ML IV BOLUS
INTRAVENOUS | Status: DC | PRN
  Administered 2022-05-16: 150 mg via INTRAVENOUS

## 2022-05-16 MED ORDER — LABETALOL HCL 5 MG/ML IV SOLN: 10.0000 mg | INTRAVENOUS | Status: DC | PRN

## 2022-05-16 MED ORDER — OXYCODONE HCL 5 MG PO TABS: 10.0000 mg | ORAL_TABLET | ORAL | Status: DC | PRN

## 2022-05-16 MED ORDER — PHENYLEPHRINE HCL-NACL 20-0.9 MG/250ML-% IV SOLN
INTRAVENOUS | Status: DC | PRN
  Administered 2022-05-16: 75 ug/min via INTRAVENOUS

## 2022-05-16 MED ORDER — JUVEN PO PACK: 1.0000 | PACK | Freq: Two times a day (BID) | ORAL | Status: DC

## 2022-05-16 MED ORDER — ACETAMINOPHEN 500 MG PO TABS
1000.0000 mg | ORAL_TABLET | Freq: Once | ORAL | Status: AC
  Administered 2022-05-16: 1000 mg via ORAL
  Filled 2022-05-16: qty 2

## 2022-05-16 MED ORDER — MIDAZOLAM HCL 2 MG/2ML IJ SOLN
INTRAMUSCULAR | Status: AC
  Filled 2022-05-16: qty 2

## 2022-05-16 MED ORDER — GUAIFENESIN-DM 100-10 MG/5ML PO SYRP: 15.0000 mL | ORAL_SOLUTION | ORAL | Status: DC | PRN

## 2022-05-16 MED ORDER — HYDRALAZINE HCL 20 MG/ML IJ SOLN: 5.0000 mg | INTRAMUSCULAR | Status: DC | PRN

## 2022-05-16 MED ORDER — POTASSIUM CHLORIDE 10 MEQ/100ML IV SOLN
10.0000 meq | INTRAVENOUS | Status: AC
  Administered 2022-05-16 (×3): 10 meq via INTRAVENOUS
  Filled 2022-05-16 (×2): qty 100

## 2022-05-16 MED ORDER — PHENYLEPHRINE 80 MCG/ML (10ML) SYRINGE FOR IV PUSH (FOR BLOOD PRESSURE SUPPORT)
PREFILLED_SYRINGE | INTRAVENOUS | Status: DC | PRN
  Administered 2022-05-16: 240 ug via INTRAVENOUS
  Administered 2022-05-16 (×5): 160 ug via INTRAVENOUS

## 2022-05-16 MED ORDER — ONDANSETRON HCL 4 MG/2ML IJ SOLN: 4.0000 mg | Freq: Four times a day (QID) | INTRAMUSCULAR | Status: DC | PRN

## 2022-05-16 MED ORDER — SODIUM CHLORIDE 0.9 % IV SOLN: INTRAVENOUS | Status: DC

## 2022-05-16 MED ORDER — PROMETHAZINE HCL 25 MG/ML IJ SOLN: 6.2500 mg | INTRAMUSCULAR | Status: DC | PRN

## 2022-05-16 MED ORDER — SUGAMMADEX SODIUM 200 MG/2ML IV SOLN
INTRAVENOUS | Status: DC | PRN
  Administered 2022-05-16: 200 mg via INTRAVENOUS

## 2022-05-16 MED ORDER — ROCURONIUM BROMIDE 10 MG/ML (PF) SYRINGE
PREFILLED_SYRINGE | INTRAVENOUS | Status: DC | PRN
  Administered 2022-05-16: 50 mg via INTRAVENOUS

## 2022-05-16 MED ORDER — 0.9 % SODIUM CHLORIDE (POUR BTL) OPTIME
TOPICAL | Status: DC | PRN
  Administered 2022-05-16: 1000 mL

## 2022-05-16 MED ORDER — BISACODYL 5 MG PO TBEC: 5.0000 mg | DELAYED_RELEASE_TABLET | Freq: Every day | ORAL | Status: DC | PRN

## 2022-05-16 MED ORDER — CHLORHEXIDINE GLUCONATE 0.12 % MT SOLN
OROMUCOSAL | Status: AC
  Administered 2022-05-16: 15 mL via OROMUCOSAL
  Filled 2022-05-16: qty 15

## 2022-05-16 MED ORDER — OXYCODONE HCL 5 MG/5ML PO SOLN: 5.0000 mg | Freq: Once | ORAL | Status: DC | PRN

## 2022-05-16 MED ORDER — POTASSIUM CHLORIDE CRYS ER 20 MEQ PO TBCR: 20.0000 meq | EXTENDED_RELEASE_TABLET | Freq: Every day | ORAL | Status: DC | PRN

## 2022-05-16 MED ORDER — ZINC SULFATE 220 (50 ZN) MG PO CAPS
220.0000 mg | ORAL_CAPSULE | Freq: Every day | ORAL | Status: DC
  Administered 2022-05-16 – 2022-05-18 (×3): 220 mg via ORAL
  Filled 2022-05-16 (×3): qty 1

## 2022-05-16 MED ORDER — CEFAZOLIN SODIUM-DEXTROSE 2-4 GM/100ML-% IV SOLN
2.0000 g | Freq: Three times a day (TID) | INTRAVENOUS | Status: DC
  Administered 2022-05-16: 2 g via INTRAVENOUS
  Filled 2022-05-16: qty 100

## 2022-05-16 MED ORDER — ONDANSETRON HCL 4 MG/2ML IJ SOLN
INTRAMUSCULAR | Status: DC | PRN
  Administered 2022-05-16: 4 mg via INTRAVENOUS

## 2022-05-16 MED ORDER — EPHEDRINE SULFATE-NACL 50-0.9 MG/10ML-% IV SOSY
PREFILLED_SYRINGE | INTRAVENOUS | Status: DC | PRN
  Administered 2022-05-16 (×2): 10 mg via INTRAVENOUS

## 2022-05-16 MED ORDER — ACETAMINOPHEN 325 MG PO TABS: 325.0000 mg | ORAL_TABLET | Freq: Four times a day (QID) | ORAL | Status: DC | PRN

## 2022-05-16 MED ORDER — MAGNESIUM SULFATE 2 GM/50ML IV SOLN: 2.0000 g | Freq: Every day | INTRAVENOUS | Status: DC | PRN

## 2022-05-16 MED ORDER — LIDOCAINE 2% (20 MG/ML) 5 ML SYRINGE
INTRAMUSCULAR | Status: DC | PRN
  Administered 2022-05-16: 60 mg via INTRAVENOUS

## 2022-05-16 MED ORDER — LACTATED RINGERS IV BOLUS
1000.0000 mL | Freq: Once | INTRAVENOUS | Status: AC
  Administered 2022-05-16: 1000 mL via INTRAVENOUS

## 2022-05-16 MED ORDER — METOPROLOL TARTRATE 5 MG/5ML IV SOLN: 2.0000 mg | INTRAVENOUS | Status: DC | PRN

## 2022-05-16 MED ORDER — POLYETHYLENE GLYCOL 3350 17 G PO PACK: 17.0000 g | PACK | Freq: Every day | ORAL | Status: DC | PRN

## 2022-05-16 MED ORDER — POTASSIUM CHLORIDE 10 MEQ/100ML IV SOLN
INTRAVENOUS | Status: DC | PRN
  Administered 2022-05-16: 10 meq via INTRAVENOUS

## 2022-05-16 MED ORDER — MIDAZOLAM HCL 5 MG/5ML IJ SOLN
INTRAMUSCULAR | Status: DC | PRN
  Administered 2022-05-16: 2 mg via INTRAVENOUS

## 2022-05-16 MED ORDER — FENTANYL CITRATE (PF) 250 MCG/5ML IJ SOLN
INTRAMUSCULAR | Status: AC
  Filled 2022-05-16: qty 5

## 2022-05-16 MED ORDER — FENTANYL CITRATE (PF) 250 MCG/5ML IJ SOLN
INTRAMUSCULAR | Status: DC | PRN
  Administered 2022-05-16: 50 ug via INTRAVENOUS

## 2022-05-16 MED ORDER — CHLORHEXIDINE GLUCONATE 0.12 % MT SOLN: 15.0000 mL | Freq: Once | OROMUCOSAL | Status: AC

## 2022-05-16 MED ORDER — SODIUM CHLORIDE 0.9 % IV SOLN: 10.0000 mL/h | Freq: Once | INTRAVENOUS | Status: DC

## 2022-05-16 MED ORDER — PHENOL 1.4 % MT LIQD: 1.0000 | OROMUCOSAL | Status: DC | PRN

## 2022-05-16 MED ORDER — OXYCODONE HCL 5 MG PO TABS: 5.0000 mg | ORAL_TABLET | Freq: Once | ORAL | Status: DC | PRN

## 2022-05-16 MED ORDER — ORAL CARE MOUTH RINSE: 15.0000 mL | Freq: Once | OROMUCOSAL | Status: AC

## 2022-05-16 MED ORDER — ALUM & MAG HYDROXIDE-SIMETH 200-200-20 MG/5ML PO SUSP: 15.0000 mL | ORAL | Status: DC | PRN

## 2022-05-16 MED ORDER — HYDROMORPHONE HCL 1 MG/ML IJ SOLN: 0.2500 mg | INTRAMUSCULAR | Status: DC | PRN

## 2022-05-16 MED ORDER — LACTATED RINGERS IV SOLN: INTRAVENOUS | Status: DC

## 2022-05-16 SURGICAL SUPPLY — 55 items
BAG COUNTER SPONGE SURGICOUNT (BAG) ×2 IMPLANT
BAG DECANTER FOR FLEXI CONT (MISCELLANEOUS) ×2 IMPLANT
BAG SPNG CNTER NS LX DISP (BAG) ×1
BLADE SAW SGTL 73X25 THK (BLADE) ×2 IMPLANT
CANISTER WOUNDNEG PRESSURE 500 (CANNISTER) IMPLANT
CNTNR URN SCR LID CUP LEK RST (MISCELLANEOUS) IMPLANT
CONT SPEC 4OZ STRL OR WHT (MISCELLANEOUS) ×1
COVER SURGICAL LIGHT HANDLE (MISCELLANEOUS) ×4 IMPLANT
DRAPE DERMATAC (DRAPES) IMPLANT
DRAPE IMP U-DRAPE 54X76 (DRAPES) ×2 IMPLANT
DRAPE INCISE IOBAN 66X45 STRL (DRAPES) ×2 IMPLANT
DRAPE ORTHO SPLIT 77X108 STRL (DRAPES) ×2
DRAPE SURG ORHT 6 SPLT 77X108 (DRAPES) ×4 IMPLANT
DRAPE U-SHAPE 47X51 STRL (DRAPES) ×2 IMPLANT
DRESSING PREVENA PLUS CUSTOM (GAUZE/BANDAGES/DRESSINGS) IMPLANT
DRSG ADAPTIC 3X8 NADH LF (GAUZE/BANDAGES/DRESSINGS) ×2 IMPLANT
DRSG PREVENA PLUS CUSTOM (GAUZE/BANDAGES/DRESSINGS) ×1
DURAPREP 26ML APPLICATOR (WOUND CARE) ×2 IMPLANT
ELECT REM PT RETURN 9FT ADLT (ELECTROSURGICAL) ×1
ELECTRODE REM PT RTRN 9FT ADLT (ELECTROSURGICAL) ×2 IMPLANT
GAUZE PAD ABD 8X10 STRL (GAUZE/BANDAGES/DRESSINGS) ×2 IMPLANT
GAUZE SPONGE 4X4 12PLY STRL (GAUZE/BANDAGES/DRESSINGS) ×2 IMPLANT
GLOVE BIO SURGEON ST LM GN SZ9 (GLOVE) ×2 IMPLANT
GLOVE BIOGEL PI IND STRL 9 (GLOVE) IMPLANT
GOWN STRL REUS W/ TWL LRG LVL3 (GOWN DISPOSABLE) ×2 IMPLANT
GOWN STRL REUS W/ TWL XL LVL3 (GOWN DISPOSABLE) ×4 IMPLANT
GOWN STRL REUS W/TWL LRG LVL3 (GOWN DISPOSABLE) ×1
GOWN STRL REUS W/TWL XL LVL3 (GOWN DISPOSABLE) ×2
GRAFT SKIN WND SURGICLOSE M95 (Tissue) IMPLANT
HANDPIECE INTERPULSE COAX TIP (DISPOSABLE)
IMMOBILIZER KNEE 20 (SOFTGOODS) IMPLANT
IMMOBILIZER KNEE 20 THIGH 36 (SOFTGOODS) IMPLANT
IMMOBILIZER KNEE 22 UNIV (SOFTGOODS) IMPLANT
IMMOBILIZER KNEE 24 THIGH 36 (MISCELLANEOUS) IMPLANT
IMMOBILIZER KNEE 24 UNIV (MISCELLANEOUS) IMPLANT
KIT BASIN OR (CUSTOM PROCEDURE TRAY) ×2 IMPLANT
KIT TURNOVER KIT B (KITS) ×2 IMPLANT
MANIFOLD NEPTUNE II (INSTRUMENTS) ×2 IMPLANT
NS IRRIG 1000ML POUR BTL (IV SOLUTION) ×2 IMPLANT
PACK TOTAL JOINT (CUSTOM PROCEDURE TRAY) ×2 IMPLANT
PACK UNIVERSAL I (CUSTOM PROCEDURE TRAY) ×2 IMPLANT
PAD ARMBOARD 7.5X6 YLW CONV (MISCELLANEOUS) ×4 IMPLANT
SET HNDPC FAN SPRY TIP SCT (DISPOSABLE) IMPLANT
STAPLER VISISTAT 35W (STAPLE) ×2 IMPLANT
SUCTION FRAZIER HANDLE 10FR (MISCELLANEOUS) ×1
SUCTION TUBE FRAZIER 10FR DISP (MISCELLANEOUS) ×2 IMPLANT
SUT ETHILON 2 0 PSLX (SUTURE) ×6 IMPLANT
SUT VIC AB 1 CT1 27 (SUTURE) ×1
SUT VIC AB 1 CT1 27XBRD ANBCTR (SUTURE) ×2 IMPLANT
SUT VIC AB 2-0 CT1 27 (SUTURE) ×1
SUT VIC AB 2-0 CT1 TAPERPNT 27 (SUTURE) ×4 IMPLANT
TOWEL GREEN STERILE (TOWEL DISPOSABLE) ×2 IMPLANT
TOWEL GREEN STERILE FF (TOWEL DISPOSABLE) ×2 IMPLANT
TRAY FOLEY MTR SLVR 16FR STAT (SET/KITS/TRAYS/PACK) IMPLANT
WATER STERILE IRR 1000ML POUR (IV SOLUTION) ×8 IMPLANT

## 2022-05-16 NOTE — Progress Notes (Signed)
PROGRESS NOTE Lee Reilly  [id number] DOB: 1986-01-23 DOA: 05/10/2022 PCP: Patient, No Pcp Per  Brief Narrative/Hospital Course: 37 y.o. 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 Wheelchair-bound at baseline with chronic left hip wound. Recent left ankle I&D (05/04/22) for OM and ulcer and discharged on 05/05/22 with 30 day course of doxycycline due to negative culture. However, on 05/07/22 he was prescribed a 20 day course of cipro as cultures returned with (providencia stuartii, pseudomonas, and bacteroides).   He presented to his PCP office on 2/26 for follow up and WBC noted to be 22.7; (up from 2/23 was 24.8) and WBC on 2/29 at [facility] noted to be 16.7. He was referred to wound care outpatient, wound cultures were obtained (reportedly positive but cannot see organisms), and imaging ordered via xray of B/L hips. Appears he then presented to [facility] due to positive wound cultures and leukocytosis.   MRI left hip which reveled multiple findings subcutaneous abscess involving perineum, inner thigh. Wound noted in lateral hip measuring 4.6 x 5.3 cm with cellulitis involving gluteal muscle and osteomyelitis of greater trochanter. Case was discussed with Dr. Sharol Given who recommended transfer to [facility] for multi-disciplinary approach to likely include ortho, general surgery, ID, and possibly plastic surgery. He was started on vancomycin and Zosyn. CRP noted to be elevated at 155 which is actually slightly down from 178 on 26 February    Subjective: Seen and examined this morning. He is alert awake oriented, n.p.o. and awaiting for surgery today Overnight patient has been afebrile. 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  Overall remains afebrile,  has leukocytosis-but has been somewhat chronic.Has been off antibiotics since 05/11/2022,following the OR anticipating resumption of vancomycin cefepime Flagyl and follow-up on operative cultures.  PICC line in place due to limited venous access. Recent Labs  Lab 05/11/22 0931 05/12/22 0311 05/13/22 0439 05/15/22 0515 05/16/22 0411  WBC 16.0* 15.1* 17.0* 15.4* 15.4*    Perineal abscess along the left perineal abscess: Seen in MRI at outside facility incidentally, seen by general surgery thought to be chronic based on their examination, likely secondary to hidradenitis suppurativa.  Surgery aspirated small amount from the penile area and did not advise surgical intervention.  Left ankle wound: S/P surgical intervention a week ago culture with procidentia and Pseudomonas and bacteroids and was on ciprofloxacin prior to admission, wound VAC expired-see #1  Hypokalemia/hypomagnesemia: Will replete and recheck k Recent Labs  Lab 05/10/22 2203 05/11/22 0931 05/12/22 0311 05/13/22 0439 05/15/22 0515 05/16/22 0411  K 3.1* 3.5 3.3* 4.2 3.3* 3.1*  CALCIUM 8.0* 7.8* 7.9* 8.3* 8.1* 8.1*  MG 1.7  --  1.7  --  2.0 2.0  PHOS 2.6  --   --   --   --   --       Microcytic anemia: Chronic, overall hemoglobin is stable, S/P 1 unit PRBC 3/2,monitor Recent Labs  Lab 05/11/22 0931 05/12/22 0311 05/13/22 0439 05/15/22 0515 05/16/22  0411  HGB 7.2* 6.4* 8.4* 8.7* 8.6*  HCT 24.8* 21.6* 27.5* 28.6* 28.3*    Left kidney staghorn calculus with hydroureteronephrosis: With a stable renal function will need to be  addressed by urology outpatient  Chronic hypotension: He is asymptomatic running in 90s to low 100  Paraplegia: Chronic history of neurogenic bladder.  Continue frequent position changes, appropriate mattress  DVT prophylaxis: SCDs Start: 05/10/22 2119 Code Status:   Code Status: Full Code Family Communication: plan of care discussed with patient at bedside. Patient status is: Inpatient because of need for operative intervention  Level of care: Med-Surg   Dispo: The patient is from: home            Anticipated disposition: TBD Objective: Vitals last 24 hrs: Vitals:   05/15/22 0744 05/15/22 2039 05/16/22 0545 05/16/22 0732  BP: 101/63 (!) 97/59 110/70 103/65  Pulse: 90 (!) 108 (!) 102 85  Resp: '18 17 17 16  '$ Temp: 98 F (36.7 C) 99.1 F (37.3 C) (!) 97.5 F (36.4 C) 98.2 F (36.8 C)  TempSrc: Oral Oral Oral Oral  SpO2: 100% 100% 100% 99%   Weight change:   Physical Examination: General exam: alert awake, older than stated age HEENT:Oral mucosa moist, Ear/Nose WNL grossly Respiratory system: bilaterally CLEAR BS, no use of accessory muscle Cardiovascular system: S1 & S2 +, No JVD. Gastrointestinal system: Abdomen soft,NT,ND, BS+ Nervous System:Alert, awake, moving extremities. Extremities: LE edema NEG, PARAPLEGIA, Skin: No rashes,no icterus. MSK: THIN muscle bulk,tone, power  Medications reviewed:  Scheduled Meds:  sodium chloride   Intravenous Once   ascorbic acid  500 mg Oral Daily   chlorhexidine  60 mL Topical Once   Chlorhexidine Gluconate Cloth  6 each Topical Daily   dextromethorphan-guaiFENesin  1 tablet Oral BID   feeding supplement  237 mL Oral BID BM   multivitamin with minerals  1 tablet Oral Daily   mupirocin ointment   Topical BID   nutrition supplement (JUVEN)  1 packet Oral BID BM   povidone-iodine  2 Application Topical Once   sodium hypochlorite   Irrigation BID   zinc sulfate  220 mg Oral Daily   Continuous Infusions:   ceFAZolin (ANCEF) IV      Diet Order             Diet NPO time specified  Diet effective midnight                 Nutrition Problem: Increased nutrient needs Etiology: wound healing Signs/Symptoms: estimated needs Interventions: MVI, Juven, Ensure Enlive (each supplement provides 350kcal and 20 grams of protein) (once diet advances)   Intake/Output Summary (Last 24 hours) at 05/16/2022 0746 Last data filed at 05/16/2022 0500 Gross per 24 hour  Intake 120 ml  Output --  Net 120 ml   Net IO Since Admission: 586 mL [05/16/22 0746]  Wt Readings from Last 3 Encounters:  05/04/22 52.2 kg  06/02/21 51.7 kg  08/29/16 94.2 kg     Unresulted Labs (From admission, onward)    None     Data Reviewed: I have personally reviewed following labs and imaging studies CBC: Recent Labs  Lab 05/10/22 2203 05/11/22 0931 05/12/22 0311 05/13/22 0439 05/15/22 0515 05/16/22 0411  WBC 16.5* 16.0* 15.1* 17.0* 15.4* 15.4*  NEUTROABS 13.6*  --   --   --   --   --   HGB 7.4* 7.2* 6.4* 8.4* 8.7* 8.6*  HCT 25.2* 24.8* 21.6* 27.5* 28.6* 28.3*  MCV 74.8*  75.6* 76.3* 76.2* 77.1* 76.9*  PLT 550* 493* 479* 519* 485* [id number]*   Basic Metabolic Panel: Recent Labs  Lab 05/10/22 2203 05/11/22 0931 05/12/22 0311 05/13/22 0439 05/15/22 0515 05/16/22 0411  NA 139 135 135 134* 132* 134*  K 3.1* 3.5 3.3* 4.2 3.3* 3.1*  CL 110 109 106 104 100 100  CO2 21* 19* 20* '23 23 25  '$ GLUCOSE 97 84 93 90 91 98  BUN 7 <5* <5* <5* 7 7  CREATININE 0.39* 0.40* 0.39* 0.40* 0.46* 0.41*  CALCIUM 8.0* 7.8* 7.9* 8.3* 8.1* 8.1*  MG 1.7  --  1.7  --  2.0 2.0  PHOS 2.6  --   --   --   --   --    GFR: Estimated Creatinine Clearance: 92.4 mL/min (A) (by C-G formula based on SCr of 0.41 mg/dL (L)). Liver Function Tests: Recent Labs  Lab 05/10/22 2203 05/11/22 0931  AST 36 26  ALT 30 26  ALKPHOS 88 86  BILITOT 0.4 0.8  PROT 7.7 7.3  ALBUMIN <1.5* <1.5*   No results for input(s): "LIPASE", "AMYLASE" in the last 168 hours. No results  for input(s): "AMMONIA" in the last 168 hours. Coagulation Profile: Recent Labs  Lab 05/10/22 2203  INR 1.2    Recent Results (from the past 240 hour(s))  MRSA Next Gen by PCR, Nasal     Status: None   Collection Time: 05/10/22 11:15 PM   Specimen: Nasal Mucosa; Nasal Swab  Result Value Ref Range Status   MRSA by PCR Next Gen NOT DETECTED NOT DETECTED Final    Comment: (NOTE) The GeneXpert MRSA Assay (FDA approved for NASAL specimens only), is one component of a comprehensive MRSA colonization surveillance program. It is not intended to diagnose MRSA infection nor to guide or monitor treatment for MRSA infections. Test performance is not FDA approved in patients less than 44 years old. Performed at [facility], [place] [address], [place], [place] [address]   MRSA Next Gen by PCR, Nasal     Status: None   Collection Time: 05/15/22  7:54 PM  Result Value Ref Range Status   MRSA by PCR Next Gen NOT DETECTED NOT DETECTED Final    Comment: (NOTE) The GeneXpert MRSA Assay (FDA approved for NASAL specimens only), is one component of a comprehensive MRSA colonization surveillance program. It is not intended to diagnose MRSA infection nor to guide or monitor treatment for MRSA infections. Test performance is not FDA approved in patients less than 36 years old. Performed at [facility], [place] [address], [place], [place] [address]     Antimicrobials: Anti-infectives (From admission, onward)    Start     Dose/Rate Route Frequency Ordered Stop   05/16/22 0600  ceFAZolin (ANCEF) IVPB 2g/100 mL premix        2 g 200 mL/hr over 30 Minutes Intravenous On call to O.R. 05/15/22 1901 05/17/22 0559   05/11/22 1815  vancomycin (VANCOREADY) IVPB 750 mg/150 mL  Status:  Discontinued        750 mg 150 mL/hr over 60 Minutes Intravenous Every 12 hours 05/11/22 1053 05/11/22 1354   05/11/22 1000  ceFEPIme (MAXIPIME) 2 g in sodium chloride 0.9 % 100 mL IVPB  Status:  Discontinued         2 g 200 mL/hr over 30 Minutes Intravenous Every 8 hours 05/11/22 0904 05/11/22 1354   05/11/22 1000  metroNIDAZOLE (FLAGYL) tablet 500 mg  Status:  Discontinued  500 mg Oral Every 12 hours 05/11/22 0904 05/11/22 1354   05/10/22 2230  piperacillin-tazobactam (ZOSYN) IVPB 3.375 g  Status:  Discontinued        3.375 g 12.5 mL/hr over 240 Minutes Intravenous Every 8 hours 05/10/22 2134 05/11/22 0904   05/10/22 2230  vancomycin (VANCOCIN) IVPB 1000 mg/200 mL premix  Status:  Discontinued        1,000 mg 200 mL/hr over 60 Minutes Intravenous Every 8 hours 05/10/22 2136 05/11/22 1053      Culture/Microbiology    Component Value Date/Time   SDES TISSUE LEFT ANKLE 05/04/2022 1405   SPECREQUEST NONE 05/04/2022 1405   CULT  05/04/2022 1405    RARE PROVIDENCIA STUARTII RARE PSEUDOMONAS AERUGINOSA RARE BACTEROIDES FRAGILIS BETA LACTAMASE NEGATIVE Performed at [facility], [place] [address], [place], [place] [address]    REPTSTATUS 05/07/2022 FINAL 05/04/2022 1405     Radiology Studies: No results found.   LOS: 6 days   Antonieta Pert, MD [facility]  05/16/2022, 7:46 AM

## 2022-05-16 NOTE — Progress Notes (Signed)
208 b/p 78/38 map(49) p- 134 temp 97.6 rest 18. Patient only complaint of being tired. Dr. Myna Hidalgo notified orders received for IV Bolus and labs. Will cont. To monitor.

## 2022-05-16 NOTE — Anesthesia Procedure Notes (Signed)
Procedure Name: Intubation Date/Time: 05/16/2022 12:13 PM  Performed by: Georgia Duff, CRNAPre-anesthesia Checklist: Patient identified, Emergency Drugs available, Suction available and Patient being monitored Patient Re-evaluated:Patient Re-evaluated prior to induction Oxygen Delivery Method: Circle System Utilized Preoxygenation: Pre-oxygenation with 100% oxygen Induction Type: IV induction Ventilation: Mask ventilation without difficulty Laryngoscope Size: Miller and 2 Grade View: Grade I Tube type: Oral Tube size: 7.5 mm Number of attempts: 1 Airway Equipment and Method: Stylet and Oral airway Placement Confirmation: ETT inserted through vocal cords under direct vision, positive ETCO2 and breath sounds checked- equal and bilateral Secured at: 20 cm Tube secured with: Tape Dental Injury: Teeth and Oropharynx as per pre-operative assessment

## 2022-05-16 NOTE — Progress Notes (Signed)
OT Cancellation Note  Patient Details Name: Lee Reilly MRN: [id number] DOB: May 11, 1985   Cancelled Treatment:    Reason Eval/Treat Not Completed: Patient at procedure or test/ unavailable (Patient off unit in surgery. Will attempt as later as schedule permits) Lodema Hong, [place]  [facility] [place] 05/16/2022, 11:12 AM

## 2022-05-16 NOTE — Progress Notes (Signed)
Brief ID note:  Patient remains stable off antibiotics since 05/11/2022.  Plan to go to the OR today with Dr. Sharol Given for left hip debridement and Girdlestone amputation.  Following the OR, anticipate resumption of vancomycin, cefepime, metronidazole and following operative cultures.   Raynelle Highland for [facility]ious [facility] 05/16/2022, 7:52 AM

## 2022-05-16 NOTE — Hospital Course (Addendum)
37 y.o. 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 Wheelchair-bound at baseline with chronic left hip wound. Recent left ankle I&D (05/04/22) for OM and ulcer and discharged on 05/05/22 with 30 day course of doxycycline due to negative culture. However, on 05/07/22 he was prescribed a 20 day course of cipro as cultures returned with (providencia stuartii, pseudomonas, and bacteroides).   He presented to his PCP office on 2/26 for follow up and WBC noted to be 22.7; (up from 2/23 was 24.8) and WBC on 2/29 at [facility] noted to be 16.7. He was referred to wound care outpatient, wound cultures were obtained (reportedly positive but cannot see organisms), and imaging ordered via xray of B/L hips. Appears he then presented to [facility] due to positive wound cultures and leukocytosis.   MRI left hip which reveled multiple findings subcutaneous abscess involving perineum, inner thigh. Wound noted in lateral hip measuring 4.6 x 5.3 cm with cellulitis involving gluteal muscle and osteomyelitis of greater trochanter. Case was discussed with Dr. Sharol Given who recommended transfer to [facility] for multi-disciplinary approach to likely include ortho, general surgery, ID, and possibly plastic surgery. He was started on vancomycin and Zosyn. CRP noted to be elevated at 155 which is actually slightly down from 178 on 26 February Patient was admitted patient had episode of hypotension and anemia needing blood transfusion.  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 Sharol Given.  Again had anemia hypotension tachycardia that subsequently stabilized with additional blood transfusions IV fluid boluses and midodrine. Overall wound culture growing Proteus.  Seen by infectious disease and informed  that he is okay for discharge on  antibiotic:cipro/flagyl X 4 weeks from the OR on 3/6.  Per Dr. Sharol Given okay for discharge on dry dressing

## 2022-05-16 NOTE — Addendum Note (Signed)
Addendum  created 05/16/22 2041 by Nilda Simmer, MD   Clinical Note Signed

## 2022-05-16 NOTE — Anesthesia Postprocedure Evaluation (Addendum)
Anesthesia Post Note  Patient: Lazerick Schulze  Procedure(s) Performed: LEFT HIP GIRDLESTONE AMPUTATION (Left: Hip)     Patient location during evaluation: PACU Anesthesia Type: General Level of consciousness: awake Pain management: pain level controlled Vital Signs Assessment: post-procedure vital signs reviewed and stable Respiratory status: spontaneous breathing, nonlabored ventilation and respiratory function stable Cardiovascular status: blood pressure returned to baseline and stable Postop Assessment: no apparent nausea or vomiting Anesthetic complications: no Comments: Patient was hypotensive in PACU. Fluid bolus given. Patient was on phenylephrine infusion. Patient was asymptomatic. CBC sent. Hgb 7.0. 1 unit pRBCs given. Patient able to be weaned off of phenylephrine.   No notable events documented.  Last Vitals:  Vitals:   05/16/22 1450 05/16/22 1455  BP: (!) 99/58 (!) 92/54  Pulse: 93 (!) 105  Resp: (!) 23 (!) 24  Temp:    SpO2: 98% 100%    Last Pain:  Vitals:   05/16/22 1400  TempSrc:   PainSc: 0-No pain                 Nilda Simmer

## 2022-05-16 NOTE — Interval H&P Note (Signed)
History and Physical Interval Note:  05/16/2022 7:02 AM  Lee Reilly  has presented today for surgery, with the diagnosis of Osteomyelitis Left Hip.  The various methods of treatment have been discussed with the patient and family. After consideration of risks, benefits and other options for treatment, the patient has consented to  Procedure(s): LEFT HIP GIRDLESTONE AMPUTATION (Left) as a surgical intervention.  The patient's history has been reviewed, patient examined, no change in status, stable for surgery.  I have reviewed the patient's chart and labs.  Questions were answered to the patient's satisfaction.     Newt Minion

## 2022-05-16 NOTE — Addendum Note (Signed)
Addendum  created 05/16/22 1636 by Georgia Duff, CRNA   Intraprocedure Event edited

## 2022-05-16 NOTE — Transfer of Care (Signed)
Immediate Anesthesia Transfer of Care Note  Patient: Lee Reilly  Procedure(s) Performed: LEFT HIP GIRDLESTONE AMPUTATION (Left: Hip)  Patient Location: PACU  Anesthesia Type:General  Level of Consciousness: drowsy and patient cooperative  Airway & Oxygen Therapy: Patient Spontanous Breathing  Post-op Assessment: Report given to RN and Post -op Vital signs reviewed and stable  Post vital signs: Reviewed and stable  Last Vitals:  Vitals Value Taken Time  BP 104/48 05/16/22 1322  Temp    Pulse 124 05/16/22 1327  Resp 24 05/16/22 1327  SpO2 100 % 05/16/22 1327  Vitals shown include unvalidated device data.  Last Pain:  Vitals:   05/16/22 1027  TempSrc: Oral  PainSc: 0-No pain         Complications: No notable events documented.

## 2022-05-16 NOTE — Plan of Care (Signed)
  Problem: Education: Goal: Knowledge of General Education information will improve Description: Including pain rating scale, medication(s)/side effects and non-pharmacologic comfort measures Outcome: Progressing   Problem: Clinical Measurements: Goal: Ability to maintain clinical measurements within normal limits will improve Outcome: Progressing Goal: Will remain free from infection Outcome: Progressing   Problem: Activity: Goal: Risk for activity intolerance will decrease Outcome: Progressing   Problem: Pain Managment: Goal: General experience of comfort will improve Outcome: Progressing   Problem: Safety: Goal: Ability to remain free from injury will improve Outcome: Progressing   

## 2022-05-16 NOTE — Op Note (Signed)
05/16/2022  1:19 PM  PATIENT:  Lee Reilly    PRE-OPERATIVE DIAGNOSIS:  Osteomyelitis Left Hip  POST-OPERATIVE DIAGNOSIS:  Same  PROCEDURE:  LEFT HIP GIRDLESTONE AMPUTATION Application of Kerecis micro graft 95 cm. Application of customizable wound VAC sponge. Local tissue rearrangement for wound closure 20 x 15 cm.  SURGEON:  Newt Minion, MD  PHYSICIAN ASSISTANT:None ANESTHESIA:   General  PREOPERATIVE INDICATIONS:  Lee Reilly is a  37 y.o. male with a diagnosis of Osteomyelitis Left Hip who failed conservative measures and elected for surgical management.    The risks benefits and alternatives were discussed with the patient preoperatively including but not limited to the risks of infection, bleeding, nerve injury, cardiopulmonary complications, the need for revision surgery, among others, and the patient was willing to proceed.  OPERATIVE IMPLANTS: Kerecis micro graft 95 cm.  '@ENCIMAGES'$ @  OPERATIVE FINDINGS: Necrotic abscess tissue sent for cultures.  OPERATIVE PROCEDURE: Patient brought the operating room and underwent MAC anesthetic.  After adequate levels anesthesia were obtained patient was placed in the right lateral decubitus position with the left side up and the left lower extremity was prepped using DuraPrep draped into a sterile field a timeout was called.  Elliptical incision was made around the decubitus ulcer left hip this left a wound that was 20 x 15 cm.  This was carried down to the subtrochanteric retractors were placed to protect soft tissue and a oscillating saw was used to perform the amputation just inferior to the lesser trochanter.  The corkscrew was used intramedullary and the head neck greater and lesser trochanter were removed in 1 block of tissue.  Electrocautery was used for hemostasis the tissue margins were clear the wound was irrigated with normal saline.  The tissue was covered with 95 cm to cover surface area 20 x 15 cm.  Local tissue  rearrangement was used to close the wound 20 x 15 cm with 2-0 nylon.  A customizable wound VAC was applied this had a good suction fit patient was taken the PACU in stable condition. DISCHARGE PLANNING:  Antibiotic duration: Would continue antibiotic coverage for 3 weeks pending tissue culture sensitivities.  Weightbearing: Not applicable  Pain medication: Opioid pathway  Dressing care/ Wound VAC: Continue wound VAC for 1 week  Ambulatory devices: Not applicable  Discharge to: Anticipate discharge to home.  Follow-up: In the office 1 week post operative.

## 2022-05-16 NOTE — Anesthesia Preprocedure Evaluation (Addendum)
Anesthesia Evaluation  Patient identified by MRN, date of birth, ID band Patient awake    Reviewed: Allergy & Precautions, NPO status , Patient's Chart, lab work & pertinent test results  History of Anesthesia Complications Negative for: history of anesthetic complications  Airway Mallampati: III  TM Distance: >3 FB Neck ROM: Full    Dental  (+) Dental Advisory Given,    Pulmonary neg shortness of breath, asthma , neg sleep apnea, neg COPD, neg recent URI, former smoker   Pulmonary exam normal breath sounds clear to auscultation       Cardiovascular (-) hypertension(-) angina (-) Past MI and (-) Cardiac Stents + dysrhythmias (tachycardia)  Rhythm:Regular Rate:Normal     Neuro/Psych neg Seizures H/o neuroblastoma at 66 months old  Neuromuscular disease (paraplegia, neurogenic bladder)    GI/Hepatic negative GI ROS,,,(+)     substance abuse  marijuana use  Endo/Other  negative endocrine ROS    Renal/GU negative Renal ROS     Musculoskeletal  (+) Arthritis ,  Osteomyelitis of left hip   Abdominal   Peds  Hematology  (+) Blood dyscrasia (Hgb 8.6), anemia   Anesthesia Other Findings K 3.1  37 y.o. 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  Reproductive/Obstetrics                              Anesthesia Physical Anesthesia Plan  ASA: 3  Anesthesia Plan: General   Post-op Pain Management: Tylenol PO (pre-op)* and Ketamine IV*   Induction: Intravenous  PONV Risk Score and Plan: 2 and Ondansetron, Dexamethasone and Treatment may vary due to age or medical condition  Airway Management Planned: Oral ETT  Additional Equipment:   Intra-op Plan:   Post-operative Plan:  Extubation in OR  Informed Consent: I have reviewed the patients History and Physical, chart, labs and discussed the procedure including the risks, benefits and alternatives for the proposed anesthesia with the patient or authorized representative who has indicated his/her understanding and acceptance.     Dental advisory given  Plan Discussed with: CRNA and Anesthesiologist  Anesthesia Plan Comments: (Risks of general anesthesia discussed including, but not limited to, sore throat, hoarse voice, chipped/damaged teeth, injury to vocal cords, nausea and vomiting, allergic reactions, lung infection, heart attack, stroke, and death. All questions answered. )         Anesthesia Quick Evaluation

## 2022-05-17 ENCOUNTER — Telehealth: Payer: Self-pay

## 2022-05-17 ENCOUNTER — Encounter (HOSPITAL_COMMUNITY): Payer: Self-pay | Admitting: Orthopedic Surgery

## 2022-05-17 LAB — CBC
HCT: 23.4 % — ABNORMAL LOW (ref 39.0–52.0)
Hemoglobin: 7.3 g/dL — ABNORMAL LOW (ref 13.0–17.0)
MCH: 24.9 pg — ABNORMAL LOW (ref 26.0–34.0)
MCHC: 31.2 g/dL (ref 30.0–36.0)
MCV: 79.9 fL — ABNORMAL LOW (ref 80.0–100.0)
Platelets: 311 10*3/uL (ref 150–400)
RBC: 2.93 MIL/uL — ABNORMAL LOW (ref 4.22–5.81)
RDW: 18.7 % — ABNORMAL HIGH (ref 11.5–15.5)
WBC: 12.8 10*3/uL — ABNORMAL HIGH (ref 4.0–10.5)
nRBC: 0 % (ref 0.0–0.2)

## 2022-05-17 LAB — BASIC METABOLIC PANEL
Anion gap: 4 — ABNORMAL LOW (ref 5–15)
BUN: 5 mg/dL — ABNORMAL LOW (ref 6–20)
CO2: 25 mmol/L (ref 22–32)
Calcium: 7.5 mg/dL — ABNORMAL LOW (ref 8.9–10.3)
Chloride: 104 mmol/L (ref 98–111)
Creatinine, Ser: 0.46 mg/dL — ABNORMAL LOW (ref 0.61–1.24)
GFR, Estimated: >60 mL/min (ref >60)
Glucose, Bld: 94 mg/dL (ref 70–99)
Potassium: 3.4 mmol/L — ABNORMAL LOW (ref 3.5–5.1)
Sodium: 133 mmol/L — ABNORMAL LOW (ref 135–145)

## 2022-05-17 LAB — LACTIC ACID, PLASMA
Lactic Acid, Venous: 2.5 mmol/L (ref 0.5–1.9)
Lactic Acid, Venous: 0.9 mmol/L (ref 0.5–1.9)

## 2022-05-17 LAB — PREPARE RBC (CROSSMATCH)

## 2022-05-17 LAB — HEMOGLOBIN AND HEMATOCRIT, BLOOD
HCT: 25.3 % — ABNORMAL LOW (ref 39.0–52.0)
Hemoglobin: 8 g/dL — ABNORMAL LOW (ref 13.0–17.0)

## 2022-05-17 MED ORDER — SODIUM CHLORIDE 0.9% IV SOLUTION
Freq: Once | INTRAVENOUS | Status: AC
  Administered 2022-05-17: 10 mL via INTRAVENOUS

## 2022-05-17 MED ORDER — ALBUMIN HUMAN 5 % IV SOLN
25.0000 g | Freq: Once | INTRAVENOUS | Status: AC
  Administered 2022-05-17: 25 g via INTRAVENOUS
  Filled 2022-05-17: qty 500

## 2022-05-17 MED ORDER — SODIUM CHLORIDE 0.9% IV SOLUTION: Freq: Once | INTRAVENOUS | Status: DC

## 2022-05-17 MED ORDER — METRONIDAZOLE 500 MG PO TABS
500.0000 mg | ORAL_TABLET | Freq: Two times a day (BID) | ORAL | Status: DC
  Administered 2022-05-17 – 2022-05-18 (×3): 500 mg via ORAL
  Filled 2022-05-17 (×3): qty 1

## 2022-05-17 MED ORDER — POTASSIUM CHLORIDE CRYS ER 20 MEQ PO TBCR
20.0000 meq | EXTENDED_RELEASE_TABLET | Freq: Once | ORAL | Status: AC
  Administered 2022-05-17: 20 meq via ORAL
  Filled 2022-05-17: qty 1

## 2022-05-17 MED ORDER — SODIUM CHLORIDE 0.9 % IV BOLUS
1000.0000 mL | Freq: Once | INTRAVENOUS | Status: AC
  Administered 2022-05-17: 1000 mL via INTRAVENOUS

## 2022-05-17 MED ORDER — SODIUM CHLORIDE 0.9 % IV SOLN
2.0000 g | Freq: Three times a day (TID) | INTRAVENOUS | Status: DC
  Administered 2022-05-17 – 2022-05-18 (×4): 2 g via INTRAVENOUS
  Filled 2022-05-17 (×4): qty 12.5

## 2022-05-17 MED ORDER — VANCOMYCIN HCL 750 MG/150ML IV SOLN
750.0000 mg | Freq: Two times a day (BID) | INTRAVENOUS | Status: DC
  Administered 2022-05-17 – 2022-05-18 (×3): 750 mg via INTRAVENOUS
  Filled 2022-05-17 (×4): qty 150

## 2022-05-17 MED ORDER — MIDODRINE HCL 5 MG PO TABS
5.0000 mg | ORAL_TABLET | Freq: Three times a day (TID) | ORAL | Status: DC
  Administered 2022-05-17 – 2022-05-18 (×5): 5 mg via ORAL
  Filled 2022-05-17 (×5): qty 1

## 2022-05-17 MED ORDER — LACTATED RINGERS IV BOLUS
1000.0000 mL | Freq: Once | INTRAVENOUS | Status: AC
  Administered 2022-05-17: 1000 mL via INTRAVENOUS

## 2022-05-17 MED ORDER — MELATONIN 3 MG PO TABS
3.0000 mg | ORAL_TABLET | Freq: Every day | ORAL | Status: DC
  Administered 2022-05-17: 3 mg via ORAL
  Filled 2022-05-17: qty 1

## 2022-05-17 MED ORDER — ZOLPIDEM TARTRATE 5 MG PO TABS: 5.0000 mg | ORAL_TABLET | Freq: Every evening | ORAL | Status: DC | PRN

## 2022-05-17 NOTE — Progress Notes (Signed)
[facility] for Infectious Disease  Date of Admission:  05/10/2022           Reason for visit: Follow up on left hip OM  Current antibiotics: None   ASSESSMENT:    37 y.o. 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  Operative cultures growing GNR's.  He was off antibiotics since 05/11/22 prior to the OR.  Perineal, thigh, buttock HS with small perianal abscess: Seen previously by general surgery.  No intervention recommended at that time. Chronic left ankle ulcer: Status post debridement 05/04/22 with cultures growing Pseudomonas aeruginosa, Providencia stuartii, and Bacteroides fragilis. Paraplegia.  RECOMMENDATIONS:    Resume vancomycin, cefepime, and metronidazole Wound care PICC is in place Follow cultures Following   Principal Problem:   Osteomyelitis of left hip (HCC) Active Problems:   Paraplegia (HCC)   Hypokalemia   Microcytic anemia   Pressure injury of skin    MEDICATIONS:    Scheduled Meds:  sodium chloride   Intravenous Once   sodium chloride   Intravenous Once   vitamin C  1,000 mg Oral Daily   Chlorhexidine Gluconate Cloth  6 each Topical Daily   dextromethorphan-guaiFENesin  1 tablet Oral BID   docusate sodium  100 mg Oral Daily   feeding supplement  237 mL Oral BID BM   multivitamin with minerals  1 tablet Oral Daily   mupirocin ointment   Topical BID   nutrition supplement (JUVEN)  1 packet Oral BID BM   pantoprazole  40 mg Oral Daily   sodium hypochlorite   Irrigation BID   zinc sulfate  220 mg Oral Daily   Continuous Infusions:  sodium chloride 75 mL/hr at 05/16/22 2217   sodium chloride     albumin human      ceFAZolin (ANCEF) IV 2 g (05/16/22 2221)   magnesium sulfate bolus IVPB     PRN Meds:.acetaminophen, alum & mag hydroxide-simeth, bisacodyl, guaiFENesin-dextromethorphan, hydrALAZINE,  HYDROcodone-acetaminophen, HYDROmorphone (DILAUDID) injection, labetalol, magnesium citrate, magnesium sulfate bolus IVPB, metoprolol tartrate, ondansetron, oxyCODONE, oxyCODONE, phenol, polyethylene glycol, potassium chloride, sodium chloride flush  SUBJECTIVE:   24 hour events:  S/P left girdlestone amputation yesterday with Dr Sharol Given.  Necrotic abscess tissue was sent for cultures Febrile overnight Hypotensive, transferred to [place] S/p RBC transfusion  Patient states that he is fatigued and not getting enough sleep due to frequent interruptions and blood draws.  He and his mother are asking for something to aid with sleep.  He is hopeful for discharge soon.  Review of Systems  All other systems reviewed and are negative.     OBJECTIVE:   Blood pressure (!) 89/48, pulse (!) 132, temperature 99 F (37.2 C), temperature source Oral, resp. rate (!) 22, SpO2 97 %. There is no height or weight on file to calculate BMI.  Physical Exam Constitutional:      Appearance: Normal appearance.  Eyes:     Extraocular Movements: Extraocular movements intact.     Conjunctiva/sclera: Conjunctivae normal.  Abdominal:     General: There is no distension.     Palpations: Abdomen is soft.  Musculoskeletal:     Cervical back: Normal range of motion and neck supple.     Comments: Wound VAC is in place  Neurological:     General: No focal deficit present.     Mental Status: He is alert and oriented to person,  place, and time.  Psychiatric:        Mood and Affect: Mood normal.        Behavior: Behavior normal.      Lab Results: Lab Results  Component Value Date   WBC 15.7 (H) 05/16/2022   HGB 7.0 (L) 05/16/2022   HCT 22.2 (L) 05/16/2022   MCV 77.1 (L) 05/16/2022   PLT 333 05/16/2022    Lab Results  Component Value Date   NA 134 (L) 05/16/2022   K 3.1 (L) 05/16/2022   CO2 25 05/16/2022   GLUCOSE 98 05/16/2022   BUN 7 05/16/2022   CREATININE 0.41 (L) 05/16/2022   CALCIUM 8.1 (L)  05/16/2022   GFRNONAA >60 05/16/2022    Lab Results  Component Value Date   ALT 26 05/11/2022   AST 26 05/11/2022   ALKPHOS 86 05/11/2022   BILITOT 0.8 05/11/2022       Component Value Date/Time   CRP 15.1 (H) 05/11/2022 0931       Component Value Date/Time   ESRSEDRATE 138 (H) 05/11/2022 0931     I have reviewed the micro and lab results in Epic.  Imaging: No results found.   Imaging independently reviewed in Epic.    Raynelle [facility] for Infectious Disease [facility] [phone] pager 05/17/2022, 5:40 AM

## 2022-05-17 NOTE — Telephone Encounter (Signed)
I called and sw Kim at the number provided below. Advised Dr. Sharol Given that it is not suctioning per Dr. Sharol Given he states to remove the wound vac and appy 4x4, ABD and tape. Will call with any other concerns.

## 2022-05-17 NOTE — Progress Notes (Signed)
   Durable Medical Equipment (From admission, onward)        Start     Ordered  05/17/22 1220  For home use only DME Hospital bed  Once      Question Answer Comment Length of Need Lifetime  Patient has (list medical condition): paraplegic, wounds  The above medical condition requires: Patient requires the ability to reposition frequently  Bed type Semi-electric  Support Surface: Low Air loss Mattress    05/17/22 1225

## 2022-05-17 NOTE — Progress Notes (Signed)
Patient hypotensive and tachycardic. He underwent left hip girdlestone amputation yesterday afternoon, was hypotensive in the OR, and was given fluid bolus, 1 unit RBC, and phenylepherine.   He remains hypotensive and tachycardic on the floor. Repeat Hgb is 7.0. Plan to bolus IVF, transfuse a second unit RBC, and transfer to progressive unit.

## 2022-05-17 NOTE — TOC Progression Note (Addendum)
Transition of Care (TOC) - Progression Note  Marvetta Gibbons RN,BSN Transitions of Care Unit 4NP (Non Trauma)- RN Case Manager See Treatment Team for direct Phone #   Patient Details  Name: Lee Reilly MRN: [id number] Date of Birth: May 23, 1985  Transition of Care [facility]) CM/SW Contact  Dahlia Client Romeo Rabon, RN Phone Number: 05/17/2022, 1:39 PM  Clinical Narrative:    Cm met with pt and mom at bedside to discuss transition needs. Per conversation pt prefers to return home and is asking about extra support for home with regards to wound care. Explained to pt and mom that [facility] would not come out daily for wound care or drsg changes- pt and mom would need to be educated on drsg needs and be able to do any needed wound care, if [facility] needed for intermittent wound care/checks then we could see if an agency might accept under insurance and have staffing for [facility] to follow to assist in keeping an eye on wound.  Pt and mom voiced understanding, and voiced that they do not have a preference for a particular agency should HHRN be needed. Note that pt will go home with prevena VAC on hip, not sure what plan is for ankle drsg- will need to f/u with Dr Sharol Given to find out wound care needs for discharge.   Pt lives at home alone, mother assist as needed. Pt has custom wheelchair and states he has a new one on order. He also has shower chair. Pt asking about hospital bed and air mattress for home as he does not currently have hospital bed. MD to place order- pt does not have preference for DME provider- mom will assist in delivery to home.   Address, phone #s confirmed- Pts' PCP is at [facility] on [place] [place].   Also discussed possible need for home IV abx- awaiting ID final recommendations pending culture results- CM will f/u once plan is known- pt and mom aware if home IV abx needed then will need to coordinate care with [facility] and home infusion for this.   CM will f/u with pt in the am for further transition  coordination and needs.   Call made to [employer] to check on Hospital bed- they are checking to see if they have any available.  1500- [employer] does not have any beds available - checked with [employer] who will process and work on having Bed delivered tomorrow   Expected Discharge Plan: [place] Barriers to Discharge: Continued Medical Work up  Expected Discharge Plan and Services In-house Referral: Clinical Social Work Discharge Planning Services: CM Consult Post Acute Care Choice: Durable Medical Equipment, [place] arrangements for the past 2 months: Single Family Home                 DME Arranged: Hospital bed DME Agency: [employer] Date DME Agency Contacted: 05/17/22 Time DME Agency Contacted: 1220 Representative spoke with at DME Agency: [facility] Determinants of Health ([place]) Interventions SDOH Screenings   Food Insecurity: No Food Insecurity (05/15/2022)  Housing: Low Risk  (05/15/2022)  Transportation Needs: No Transportation Needs (05/11/2022)  Utilities: Not At Risk (05/15/2022)  Tobacco Use: Medium Risk (05/17/2022)    Readmission Risk Interventions     No data to display

## 2022-05-17 NOTE — Progress Notes (Signed)
Patient transferred to progressive care, report given to RN .

## 2022-05-17 NOTE — Progress Notes (Signed)
Pharmacy Antibiotic Note  Lee Reilly is a 37 y.o. male admitted on 05/10/2022 with  wound infection/osteomyelitis , now s/p L hip girdlestone amputation.  Pharmacy has been consulted for Vancomycin and Cefepime dosing. Noted patient is paraplegic so SCr likely not reflective of true clearance.   Plan: Vancomycin 750 mg IV q12h Cefepime 2 g IV q8h    Temp (24hrs), Avg:98.6 F (37 C), Min:97.1 F (36.2 C), Max:100.9 F (38.3 C)  Recent Labs  Lab 05/11/22 0931 05/12/22 0311 05/13/22 0439 05/15/22 0515 05/16/22 0411 05/16/22 1405 05/16/22 2130  WBC 16.0* 15.1* 17.0* 15.4* 15.4* 30.5* 15.7*  CREATININE 0.40* 0.39* 0.40* 0.46* 0.41*  --   --      Estimated Creatinine Clearance: 92.4 mL/min (A) (by C-G formula based on SCr of 0.41 mg/dL (L)).    No Known Allergies  Phillis Knack, PharmD, BCPS  05/17/2022, 5:47 AM

## 2022-05-17 NOTE — Progress Notes (Signed)
PROGRESS NOTE Lee Reilly  [id number] DOB: [birth year] DOA: 05/10/2022 PCP: Patient, No Pcp Per  Brief Narrative/Hospital Course: 37 y.o. 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 Wheelchair-bound at baseline with chronic left hip wound. Recent left ankle I&D (05/04/22) for OM and ulcer and discharged on 05/05/22 with 30 day course of doxycycline due to negative culture. However, on 05/07/22 he was prescribed a 20 day course of cipro as cultures returned with (providencia stuartii, pseudomonas, and bacteroides).   He presented to his PCP office on 2/26 for follow up and WBC noted to be 22.7; (up from 2/23 was 24.8) and WBC on 2/29 at [place] [facility] noted to be 16.7. He was referred to wound care outpatient, wound cultures were obtained (reportedly positive but cannot see organisms), and imaging ordered via xray of B/L hips. Appears he then presented to [facility] ER due to positive wound cultures and leukocytosis.   MRI left hip which reveled multiple findings subcutaneous abscess involving perineum, inner thigh. Wound noted in lateral hip measuring 4.6 x 5.3 cm with cellulitis involving gluteal muscle and osteomyelitis of greater trochanter. Case was discussed with Dr. Sharol Given who recommended transfer to [facility] for multi-disciplinary approach to likely include ortho, general surgery, ID, and possibly plastic surgery. He was started on vancomycin and Zosyn. CRP noted to be elevated at 155 which is actually slightly down from 178 on 26 February    Subjective: Patient seen examined urgently this morning Overnight hypotensive- got 2 u prbc, iv albumin was ordered awaiting.  Transferred to progressive overnight>but he has been refusing tele Hb at 7.3 Labs shows improving leukocytosis, Tmax 100.9 last  night Also refused Elamin I came and discussed with him personally He reports he wants to rest he has chronic hypotension and tachycardia he states. I explained him given his recent perioperative status-anemia we are concerned-and will need close monitoring, appropriate measures to stabilize his vitals and even transfer to ICU.  He reports he has a right to refuse all those including ICU. He agreed for IV albumin and 1 unit PRBC His current SBP was in 97.  Assessment and Plan: Principal Problem:   Osteomyelitis of left hip (HCC) Active Problems:   Paraplegia (HCC)   Hypokalemia   Microcytic anemia   Pressure injury of skin  Osteomyelitis of the greater trochanter Left hip wound with draining abscess Myositis around the left hip Left ankle wound S/p left hip debridement and Girdlestone amputation 3/6: Orthopedics ID following s/p left hip debridement and left Girdlestone amputation (Dr Sharol Given )3/6.  Patient back on vancomycin, cefepime, flagyl post op- had been off antibiotics since 05/11/2022 cefepime Flagyl. We will follow-up on operative cultures.  PICC line in place due to limited venous access. Recent Labs  Lab 05/15/22 0515 05/16/22 0411 05/16/22 1405 05/16/22 2130 05/17/22 0623  WBC 15.4* 15.4* 30.5* 15.7* 12.8*   Perineal abscess along the left perineal abscess: Seen in MRI at outside facility incidentally, seen by general surgery thought to be chronic based on their examination, likely secondary to hidradenitis suppurativa.  Surgery aspirated small amount from the penile area and did not advise surgical intervention.  Hypokalemia/hypomagnesemia: Replete K po. Recent Labs  Lab 05/10/22 2203 05/11/22 0931 05/12/22 0311 05/13/22 0439 05/15/22 0515 05/16/22 0411 05/17/22 0623  K 3.1*   < > 3.3* 4.2 3.3* 3.1* 3.4*  CALCIUM 8.0*   < >  7.9* 8.3* 8.1* 8.1* 7.5*  MG 1.7  --  1.7  --  2.0 2.0  --   PHOS 2.6  --   --   --   --   --   --    < > = values in this interval not  displayed.   Sinus tachycardia Acute on chronic hypotension: overnight blood pressure has been running low given IV fluids, 2 units PRBC.  Added midodrine.  Agreed for albumin infusion also right additional unit PRBC discussed extensively need for close monitoring telemetry frequent vitals but at this time he understands and he is refusing some of those measures although agreed for blood transfusion and albumin.  He would like to rest and does not want to be disturbed.  Check lactic acid.  Low threshold for ICU transfer.  He however is asymptomatic with hypertension  ABLA Chronic microcytic anemia: Hemoglobin with lisinopril but responds the surgical 0.3 g will transfuse 1 more unit today given hypotension so far received 3 units since admission. Recent Labs  Lab 05/15/22 0515 05/16/22 0411 05/16/22 1405 05/16/22 2130 05/17/22 0623  HGB 8.7* 8.6* 7.0* 7.0* 7.3*  HCT 28.6* 28.3* 23.0* 22.2* 23.4*    Left kidney staghorn calculus with hydroureteronephrosis: With a stable renal function will need to be addressed by urology outpatient  Paraplegia: Chronic history of neurogenic bladder.  Continue frequent position changes, appropriate mattress  DVT prophylaxis: SCD's Start: 05/16/22 2109 SCDs Start: 05/10/22 2119 Code Status:   Code Status: Full Code Family Communication: plan of care discussed with patient at bedside. Patient status is: Inpatient because of need for operative intervention  Level of care: Progressive   Dispo: The patient is from: home            Anticipated disposition: TBD Objective: Vitals last 24 hrs: Vitals:   05/17/22 0334 05/17/22 0400 05/17/22 0819 05/17/22 0934  BP: (!) 87/46 (!) 89/48 (!) 97/48 (!) 86/62  Pulse: (!) 120 (!) 132 (!) 129   Resp: (!) 25 (!) 22 (!) 22 (!) (P) 21  Temp: 99 F (37.2 C) 99 F (37.2 C) 98.3 F (36.8 C) 98.3 F (36.8 C)  TempSrc: Oral Oral Oral Oral  SpO2: 97%  96%    Weight change:   Physical Examination: General exam:  AA0x3, weak,older appearing HEENT:Oral mucosa moist, Ear/Nose WNL grossly, dentition normal. Respiratory system: bilaterally clear BS,no use of accessory muscle Cardiovascular system: S1 & S2 +, regular rate. Gastrointestinal system: Abdomen soft, NT,ND,BS+ Nervous System:Alert, awake, moving extremities and grossly nonfocal Extremities: LE ankle edema neg, lt heel w/ wound, left hip surgical site with VAC present has pinkish bloody discharge  Skin: No rashes,no icterus. MSK: Paraplegic   Medications reviewed:  Scheduled Meds:  sodium chloride   Intravenous Once   sodium chloride   Intravenous Once   sodium chloride   Intravenous Once   vitamin C  1,000 mg Oral Daily   Chlorhexidine Gluconate Cloth  6 each Topical Daily   dextromethorphan-guaiFENesin  1 tablet Oral BID   docusate sodium  100 mg Oral Daily   feeding supplement  237 mL Oral BID BM   midodrine  5 mg Oral TID WC   multivitamin with minerals  1 tablet Oral Daily   mupirocin ointment   Topical BID   nutrition supplement (JUVEN)  1 packet Oral BID BM   pantoprazole  40 mg Oral Daily   sodium hypochlorite   Irrigation BID   zinc sulfate  220 mg Oral Daily  Continuous Infusions:  sodium chloride 75 mL/hr at 05/16/22 2217   sodium chloride     ceFEPime (MAXIPIME) IV 2 g (05/17/22 0629)   magnesium sulfate bolus IVPB     vancomycin 750 mg (05/17/22 0824)   Diet Order             Diet regular Room service appropriate? Yes; Fluid consistency: Thin  Diet effective now                 Nutrition Problem: Increased nutrient needs Etiology: wound healing Signs/Symptoms: estimated needs Interventions: MVI, Juven, Ensure Enlive (each supplement provides 350kcal and 20 grams of protein) (once diet advances)   Intake/Output Summary (Last 24 hours) at 05/17/2022 1008 Last data filed at 05/17/2022 0400 Gross per 24 hour  Intake 3417 ml  Output 800 ml  Net 2617 ml   Net IO Since Admission: 3,203 mL [05/17/22 1008]  Wt  Readings from Last 3 Encounters:  05/04/22 52.2 kg  06/02/21 51.7 kg  08/29/16 94.2 kg     Unresulted Labs (From admission, onward)     Start     Ordered   05/17/22 [id number]  Basic metabolic panel  Daily,   R     Question:  Specimen collection method  Answer:  IV Team=IV Team collect   05/16/22 1137   05/17/22 0500  CBC  Daily,   R     Question:  Specimen collection method  Answer:  IV Team=IV Team collect   05/16/22 1137          Data Reviewed: I have personally reviewed following labs and imaging studies CBC: Recent Labs  Lab 05/10/22 2203 05/11/22 0931 05/15/22 0515 05/16/22 0411 05/16/22 1405 05/16/22 2130 05/17/22 0623  WBC 16.5*   < > 15.4* 15.4* 30.5* 15.7* 12.8*  NEUTROABS 13.6*  --   --   --   --   --   --   HGB 7.4*   < > 8.7* 8.6* 7.0* 7.0* 7.3*  HCT 25.2*   < > 28.6* 28.3* 23.0* 22.2* 23.4*  MCV 74.8*   < > 77.1* 76.9* 78.2* 77.1* 79.9*  PLT 550*   < > 485* 465* 452* 333 311   < > = values in this interval not displayed.   Basic Metabolic Panel: Recent Labs  Lab 05/10/22 2203 05/11/22 0931 05/12/22 0311 05/13/22 0439 05/15/22 0515 05/16/22 0411 05/17/22 0623  NA 139   < > 135 134* 132* 134* 133*  K 3.1*   < > 3.3* 4.2 3.3* 3.1* 3.4*  CL 110   < > 106 104 100 100 104  CO2 21*   < > 20* '23 23 25 25  '$ GLUCOSE 97   < > 93 90 91 98 94  BUN 7   < > <5* <5* 7 7 5*  CREATININE 0.39*   < > 0.39* 0.40* 0.46* 0.41* 0.46*  CALCIUM 8.0*   < > 7.9* 8.3* 8.1* 8.1* 7.5*  MG 1.7  --  1.7  --  2.0 2.0  --   PHOS 2.6  --   --   --   --   --   --    < > = values in this interval not displayed.   GFR: Estimated Creatinine Clearance: 92.4 mL/min (A) (by C-G formula based on SCr of 0.46 mg/dL (L)). Liver Function Tests: Recent Labs  Lab 05/10/22 2203 05/11/22 0931  AST 36 26  ALT 30 26  ALKPHOS 88 86  BILITOT 0.4  0.8  PROT 7.7 7.3  ALBUMIN <1.5* <1.5*   No results for input(s): "LIPASE", "AMYLASE" in the last 168 hours. No results for input(s): "AMMONIA" in  the last 168 hours. Coagulation Profile: Recent Labs  Lab 05/10/22 2203  INR 1.2    Recent Results (from the past 240 hour(s))  MRSA Next Gen by PCR, Nasal     Status: None   Collection Time: 05/10/22 11:15 PM   Specimen: Nasal Mucosa; Nasal Swab  Result Value Ref Range Status   MRSA by PCR Next Gen NOT DETECTED NOT DETECTED Final    Comment: (NOTE) The GeneXpert MRSA Assay (FDA approved for NASAL specimens only), is one component of a comprehensive MRSA colonization surveillance program. It is not intended to diagnose MRSA infection nor to guide or monitor treatment for MRSA infections. Test performance is not FDA approved in patients less than 11 years old. Performed at [facility], [place] [address] [place], [place] [address]   MRSA Next Gen by PCR, Nasal     Status: None   Collection Time: 05/15/22  7:54 PM  Result Value Ref Range Status   MRSA by PCR Next Gen NOT DETECTED NOT DETECTED Final    Comment: (NOTE) The GeneXpert MRSA Assay (FDA approved for NASAL specimens only), is one component of a comprehensive MRSA colonization surveillance program. It is not intended to diagnose MRSA infection nor to guide or monitor treatment for MRSA infections. Test performance is not FDA approved in patients less than 6 years old. Performed at [facility], [place] [address], [place], [place] [address]   Aerobic/Anaerobic Culture w Gram Stain (surgical/deep wound)     Status: None (Preliminary result)   Collection Time: 05/16/22 12:30 PM   Specimen: Soft Tissue, Other  Result Value Ref Range Status   Specimen Description TISSUE  Final   Special Requests HIP  Final   Gram Stain   Final    NO WBC SEEN NO ORGANISMS SEEN Performed at [facility], [address] [place], [place] [address]    Culture FEW GRAM NEGATIVE RODS  Final   Report Status PENDING  Incomplete    Antimicrobials: Anti-infectives (From admission, onward)    Start     Dose/Rate Route  Frequency Ordered Stop   05/17/22 0800  vancomycin (VANCOREADY) IVPB 750 mg/150 mL        750 mg 150 mL/hr over 60 Minutes Intravenous Every 12 hours 05/17/22 0551     05/17/22 0645  ceFEPIme (MAXIPIME) 2 g in sodium chloride 0.9 % 100 mL IVPB        2 g 200 mL/hr over 30 Minutes Intravenous Every 8 hours 05/17/22 0551     05/16/22 2200  ceFAZolin (ANCEF) IVPB 2g/100 mL premix  Status:  Discontinued        2 g 200 mL/hr over 30 Minutes Intravenous Every 8 hours 05/16/22 2108 05/17/22 0551   05/16/22 0600  ceFAZolin (ANCEF) IVPB 2g/100 mL premix        2 g 200 mL/hr over 30 Minutes Intravenous On call to O.R. 05/15/22 1901 05/16/22 1216   05/11/22 1815  vancomycin (VANCOREADY) IVPB 750 mg/150 mL  Status:  Discontinued        750 mg 150 mL/hr over 60 Minutes Intravenous Every 12 hours 05/11/22 1053 05/11/22 1354   05/11/22 1000  ceFEPIme (MAXIPIME) 2 g in sodium chloride 0.9 % 100 mL IVPB  Status:  Discontinued  2 g 200 mL/hr over 30 Minutes Intravenous Every 8 hours 05/11/22 0904 05/11/22 1354   05/11/22 1000  metroNIDAZOLE (FLAGYL) tablet 500 mg  Status:  Discontinued        500 mg Oral Every 12 hours 05/11/22 0904 05/11/22 1354   05/10/22 2230  piperacillin-tazobactam (ZOSYN) IVPB 3.375 g  Status:  Discontinued        3.375 g 12.5 mL/hr over 240 Minutes Intravenous Every 8 hours 05/10/22 2134 05/11/22 0904   05/10/22 2230  vancomycin (VANCOCIN) IVPB 1000 mg/200 mL premix  Status:  Discontinued        1,000 mg 200 mL/hr over 60 Minutes Intravenous Every 8 hours 05/10/22 2136 05/11/22 1053      Culture/Microbiology    Component Value Date/Time   SDES TISSUE 05/16/2022 1230   SPECREQUEST HIP 05/16/2022 [place] NEGATIVE RODS 05/16/2022 1230   REPTSTATUS PENDING 05/16/2022 1230     Radiology Studies: No results found.   LOS: 7 days   Antonieta Pert, MD [facility]  05/17/2022, 10:08 AM

## 2022-05-17 NOTE — Progress Notes (Addendum)
   05/17/22 0447  Provider Notification  Provider Name/Title Dr Zebedee Iba  Date Provider Notified 05/17/22  Time Provider Notified [phone]  Method of Notification Page  Notification Reason Medical equipment refusal  Medical equipment refused/Pt. educated regarding refusal Telemetry / Continuous monitoring;O2 Sat / Continuous monitoring;Other (Comment) (BP monitoring)   Patient refusing monitoring of BP, tele, and spo2. He also requested that IV be removed and states "I want to be able to sleep and I can't with all of this stuff, I don't want any of it."

## 2022-05-17 NOTE — Progress Notes (Signed)
Occupational Therapy Treatment Patient Details Name: Lee Reilly MRN: [id number] DOB: 1985-05-15 Today's Date: 05/17/2022   History of present illness Pt is a 37 y.o. male s/p I&D L ankle ulcer 2/23 and then d/c home with wound vac. 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   OT comments  Pt currently in bed sitting upright.  He reports increased fatigue and not sleeping well.  BP 86/62 with HR at 117 BPM.  Discussed need for daily inspection of skin using mirror and cell phone.  Pt reports having a mirror at home and he was issued one here, however it is missing since his room switch.  Also, discussed positioning in bed as pt tends to sleep in his left side.  With hospital bed recommended, suggested trying to try and sleep on his back or right side to see if he can get comfortable.  Recommend continued acute care OT to further education on positioning, skin care, pressure relief, to to further work on ADLs to see if pt is close to his normal functional baseline.     Recommendations for follow up therapy are one component of a multi-disciplinary discharge planning process, led by the attending physician.  Recommendations may be updated based on patient status, additional functional criteria and insurance authorization.    Follow Up Recommendations  Home health OT     Assistance Recommended at Discharge Intermittent Supervision/Assistance  Patient can return home with the following  Other (comment);Assist for transportation;Assistance with cooking/housework   Equipment Recommendations  Hospital bed;Other (comment) (air mattress)       Precautions / Restrictions Precautions Precautions: Other (comment) Precaution Comments: wound vac left  hip Restrictions Weight Bearing Restrictions: No LLE Weight Bearing: Weight bearing as tolerated       Mobility Bed Mobility                    Transfers                         Balance                                           ADL either performed or assessed with clinical judgement   ADL Overall ADL's : Needs assistance/impaired                                       General ADL Comments: Pt in bed during session stating fatigue.  Declined wanting to get OOB to wheelchair or work on any selfcare from bed level.  He was self feeding and taking meds with nursing present.  Discussed with pt need for daily skin checks with use of the inspection mirror or phone.  Pt reports having inspection mirror at home and one was issued prior to changing rooms here at the hospital but is not currently present.  Also discussed positioning for sleeping as pt reports sleeping on his left side all the time and he feels he may not be able to sleep in other positions.  Discussed likely having air mattress  at home and possibly elevating HOB as it will likley be a hospital bed and trying it.  Educated pt on need to talk to Dr. Sharol Given about his wounds to be sure he is not allowed to sleep on his left side.  Will continue to follow for activity to make sure pt can complete ADLs at [facility] for home with PRN assist initially from his mom.      Cognition Arousal/Alertness: Awake/alert Behavior During Therapy: WFL for tasks assessed/performed Overall Cognitive Status: Within Functional Limits for tasks assessed                                                     Pertinent Vitals/ Pain       Pain Assessment Pain Assessment: Faces Pain Score: 0-No pain         Frequency  Min 2X/week        Progress Toward Goals  OT Goals(current goals can now be found in the care plan section)     Acute Rehab OT Goals Patient Stated Goal: Pt  wants to get an air mattress for home OT Goal Formulation: With patient Time For Goal Achievement: 05/25/22 Potential to Achieve Goals: Good  Plan Discharge plan remains appropriate       AM-PAC OT "6 Clicks" Daily Activity     Outcome Measure   Help from another person eating meals?: None Help from another person taking care of personal grooming?: None Help from another person toileting, which includes using toliet, bedpan, or urinal?: Total Help from another person bathing (including washing, rinsing, drying)?: None Help from another person to put on and taking off regular upper body clothing?: None Help from another person to put on and taking off regular lower body clothing?: None 6 Click Score: 21    End of Session    OT Visit Diagnosis: Other (comment) (positioning, skin care/monitoring)   Activity Tolerance Patient limited by fatigue   Patient Left in bed;with call bell/phone within reach   Nurse Communication Other (comment) (Pt feeling tired and not wanting to get OOB.)        Time: [id number] OT Time Calculation (min): 11 min  Charges: OT General Charges $OT Visit: 1 Visit OT Treatments $Self Care/Home Management : 8-22 mins  Clyda Greener, OTR/L [place]  Office [phone] 05/17/2022

## 2022-05-17 NOTE — Telephone Encounter (Signed)
Caryl Pina from [facility] called for Dr.Duda.  Patient had surgery and his wound vac has a "serious leak". It needs to be changed, and she wants to speak with Dr.Duda. Please call at [phone]

## 2022-05-17 NOTE — Progress Notes (Signed)
Date and time results received: 05/17/22 1235   Test: lactic acid  Critical Value: 2.5  Name of Provider Notified: Dr. Antonieta Pert   Orders Received? Or Actions Taken?: New orders received

## 2022-05-17 NOTE — Progress Notes (Signed)
Patient ID: Lee Reilly, male   DOB: 27-Sep-1985, 37 y.o.   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  Patient has 450 cc in the wound VAC canister.  Cultures are pending.  Patient has had acute blood loss anemia with anticipated total transfusion of 2 units packed red blood cells.  Patient has refused telemetry.  Anticipate he could return to the floor.

## 2022-05-17 NOTE — Progress Notes (Signed)
Nutrition Follow-up  DOCUMENTATION CODES:   Not applicable  INTERVENTION:   D/C Ensure, Juven, and MVI   Encourage PO intake Double Protein at meals   Continue: Vitamin C 1000 mg daily  Zinc 220 mg daily until 3/20  NUTRITION DIAGNOSIS:   Increased nutrient needs related to wound healing as evidenced by estimated needs. Ongoing.   GOAL:   Patient will meet greater than or equal to 90% of their needs Progressing.   MONITOR:   Diet advancement  REASON FOR ASSESSMENT:   Consult Assessment of nutrition requirement/status  ASSESSMENT:   37 y.o. male admits related to left hip wound. PMH includes: anemia, asthma, neuroblastoma, neurogenic bladder, paraplegia, pneumonia, tachycardia. Pt is currently receiving medical management related to osteomyelitis.  Visited pt x 2, however pt unavailable both times.  Spoke with pt's RN. Per RN pt is refusing Ensure, Juven, and MVI. He does seem to consistently eat 100% of his meals, although it takes him a very long time to eat. He is able to feed himself.  It appears pt is refusing other care as well.   3/6 - s/p L hip debridement and girdlestone amputation  Medications reviewed and include: Vitamin C, colace, MVI with minerals, Juven, protonix, zinc sulfate (through 3/20) Mag sulfate x 1 3/6  Labs reviewed: Na 133, K 3.4 CRP: 15.1 3/1   Diet Order:   Diet Order             Diet regular Room service appropriate? Yes; Fluid consistency: Thin  Diet effective now                   EDUCATION NEEDS:   Not appropriate for education at this time  Skin:  Skin Assessment: Skin Integrity Issues: Skin Integrity Issues:: Incisions Unstageable: L thigh, L elbow Incisions: hip  Last BM:  3/6 small  Height:   Ht Readings from Last 1 Encounters:  05/04/22 '5\' 3"'$  (1.6 m)    Weight:   Wt Readings from Last 1 Encounters:  05/04/22 52.2 kg    BMI:  There is no height or weight on file to calculate BMI.  Estimated  Nutritional Needs:   Kcal:  [id number] kcals  Protein:  80-90 gm  Fluid:  >/= 1.5 L   Cornelius Marullo P., RD, LDN, CNSC See AMiON for contact information

## 2022-05-17 NOTE — Progress Notes (Signed)
PT Cancellation Note  Patient Details Name: Lee Reilly MRN: [id number] DOB: October 31, 1985   Cancelled Treatment:    Reason Eval/Treat Not Completed: Other (comment)  Per RN, pt is sleeping and does not want to be disturbed. Will attempt later as schedule permits.   [place]  Office [phone]   Rexanne Mano 05/17/2022, 2:24 PM

## 2022-05-17 NOTE — Progress Notes (Signed)
Patient noted to have an elevated heart rate of 129. And BP of 97/48 (61). Patient refusing to wear telemetry monitor and albumin infusion. Patient educated on the risk of his condition worsening if he does not follow the treatment plan. He states he is not concerned with his heart rate it is a long standing problem and its his right to refuse. Dr. Lupita Leash notified

## 2022-05-18 ENCOUNTER — Other Ambulatory Visit (HOSPITAL_COMMUNITY): Payer: Self-pay

## 2022-05-18 LAB — BASIC METABOLIC PANEL
Anion gap: 9 (ref 5–15)
BUN: <5 mg/dL — ABNORMAL LOW (ref 6–20)
CO2: 19 mmol/L — ABNORMAL LOW (ref 22–32)
Calcium: 7.9 mg/dL — ABNORMAL LOW (ref 8.9–10.3)
Chloride: 107 mmol/L (ref 98–111)
Creatinine, Ser: 0.46 mg/dL — ABNORMAL LOW (ref 0.61–1.24)
GFR, Estimated: >60 mL/min (ref >60)
Glucose, Bld: 75 mg/dL (ref 70–99)
Potassium: 3.3 mmol/L — ABNORMAL LOW (ref 3.5–5.1)
Sodium: 135 mmol/L (ref 135–145)

## 2022-05-18 LAB — TYPE AND SCREEN
ABO/RH(D): B NEG
Antibody Screen: NEGATIVE
Unit division: 0
Unit division: 0
Unit division: 0

## 2022-05-18 LAB — BPAM RBC
Blood Product Expiration Date: 202403092359
Blood Product Expiration Date: 202403162359
Blood Product Expiration Date: 202403302359
ISSUE DATE / TIME: 202403061538
ISSUE DATE / TIME: 202403070211
ISSUE DATE / TIME: 202403071339
Unit Type and Rh: 1700
Unit Type and Rh: 1700
Unit Type and Rh: 9500

## 2022-05-18 LAB — CBC
HCT: 25.9 % — ABNORMAL LOW (ref 39.0–52.0)
Hemoglobin: 8.2 g/dL — ABNORMAL LOW (ref 13.0–17.0)
MCH: 26 pg (ref 26.0–34.0)
MCHC: 31.7 g/dL (ref 30.0–36.0)
MCV: 82.2 fL (ref 80.0–100.0)
Platelets: 300 10*3/uL (ref 150–400)
RBC: 3.15 MIL/uL — ABNORMAL LOW (ref 4.22–5.81)
RDW: 18.6 % — ABNORMAL HIGH (ref 11.5–15.5)
WBC: 17.8 10*3/uL — ABNORMAL HIGH (ref 4.0–10.5)
nRBC: 0 % (ref 0.0–0.2)

## 2022-05-18 MED ORDER — CIPROFLOXACIN HCL 500 MG PO TABS
500.0000 mg | ORAL_TABLET | Freq: Two times a day (BID) | ORAL | 0 refills | Status: AC
  Filled 2022-05-18: qty 52, 26d supply, fill #0

## 2022-05-18 MED ORDER — OXYCODONE-ACETAMINOPHEN 5-325 MG PO TABS: 1.0000 | ORAL_TABLET | ORAL | 0 refills | Status: DC | PRN

## 2022-05-18 MED ORDER — MIDODRINE HCL 5 MG PO TABS
5.0000 mg | ORAL_TABLET | Freq: Three times a day (TID) | ORAL | 0 refills | Status: AC
  Filled 2022-05-18: qty 90, 30d supply, fill #0

## 2022-05-18 MED ORDER — MELATONIN 3 MG PO TABS
3.0000 mg | ORAL_TABLET | Freq: Every day | ORAL | 0 refills | Status: AC
  Filled 2022-05-18: qty 30, 30d supply, fill #0

## 2022-05-18 MED ORDER — POTASSIUM CHLORIDE CRYS ER 20 MEQ PO TBCR
40.0000 meq | EXTENDED_RELEASE_TABLET | Freq: Once | ORAL | Status: AC
  Administered 2022-05-18: 40 meq via ORAL
  Filled 2022-05-18: qty 2

## 2022-05-18 MED ORDER — METRONIDAZOLE 500 MG PO TABS
500.0000 mg | ORAL_TABLET | Freq: Two times a day (BID) | ORAL | 0 refills | Status: AC
  Filled 2022-05-18: qty 52, 26d supply, fill #0

## 2022-05-18 MED ORDER — CIPROFLOXACIN HCL 500 MG PO TABS
500.0000 mg | ORAL_TABLET | Freq: Two times a day (BID) | ORAL | Status: DC
  Administered 2022-05-18: 500 mg via ORAL
  Filled 2022-05-18 (×3): qty 1

## 2022-05-18 MED ORDER — ASCORBIC ACID 1000 MG PO TABS
1000.0000 mg | ORAL_TABLET | Freq: Every day | ORAL | 0 refills | Status: AC
  Filled 2022-05-18: qty 30, 30d supply, fill #0

## 2022-05-18 MED ORDER — ZINC SULFATE 220 (50 ZN) MG PO TABS
220.0000 mg | ORAL_TABLET | Freq: Every day | ORAL | 0 refills | Status: AC
  Filled 2022-05-18: qty 30, 30d supply, fill #0

## 2022-05-18 MED ORDER — DOCUSATE SODIUM 100 MG PO CAPS
100.0000 mg | ORAL_CAPSULE | Freq: Every day | ORAL | 0 refills | Status: AC
  Filled 2022-05-18: qty 10, 10d supply, fill #0

## 2022-05-18 NOTE — Discharge Instructions (Signed)
Pat dry your wound then apply dry dressing (gauze and abdominal dressing). Change dressings as needed.

## 2022-05-18 NOTE — Progress Notes (Signed)
Pt discharge education and instructions completed with pt and mother at bedside. Both voices understanding and denies any questions. Pt IV and telemetry removed. Pt discharged home with mother to transport him home. Pt wound dsg remains unremarkable. Pt to pick up his percocet from preferred pharmacy on file. Pt transported off unit via personal wheelchair with belongings and mother to the side. Delia Heady RN

## 2022-05-18 NOTE — TOC Transition Note (Signed)
Transition of Care (TOC) - CM/SW Discharge Note Marvetta Gibbons RN,BSN Transitions of Care Unit 4NP (Non Trauma)- RN Case Manager See Treatment Team for direct Phone #   Patient Details  Name: Lee Reilly MRN: [id number] Date of Birth: February 11, 1986  Transition of Care [facility]) CM/SW Contact:  Dawayne Patricia, RN Phone Number: 05/18/2022, 2:25 PM   Clinical Narrative:    CM returned to room this am at 58 as previously planned- pt requested that CM come back later- agreed that CM would return after lunch.   Per MD pt stable for transition home- per ID pt will not need IV abx and will transition to Oral abx for home.  Per Dr Sharol Given- pt  "just needs dry dressing changes to both wounds as needed no wound care necessary at this time"- no HH wound care needs- pt/mom will be educated on wound care prior to discharge- and VAC will be removed.   CM spoke with pt and mom at bedside- per mom bed is to be delivered around 2pm this afternoon- mom to meet them at pt's home. Mom will return and plans to transport pt home, pt has is own wheelchair at the bedside.  Discussed no need for [facility] at this time, pt will not need home IV abx, and wound care education to be done prior to discharge- pt and mom voiced understanding- Pt will f/u with Dr. Sharol Given.   No further TOC needs noted at this time.    Final next level of care: Home/Self Care Barriers to Discharge: Barriers Resolved   Patient Goals and CMS Choice CMS Medicare.gov Compare Post Acute Care list provided to:: Patient Choice offered to / list presented to : Patient, Parent  Discharge Placement                 Home        Discharge Plan and Services Additional resources added to the After Visit Summary for   In-house Referral: Clinical Social Work Discharge Planning Services: CM Consult Post Acute Care Choice: Durable Medical Equipment, Home Health          DME Arranged: Hospital bed DME Agency: [employer] Date DME Agency Contacted:  05/17/22 Time DME Agency Contacted: 1500 Representative spoke with at DME Agency: Erasmo Downer HH Arranged: NA [place] Agency: NA        Social Determinants of Health ([place]) Interventions SDOH Screenings   Food Insecurity: No Food Insecurity (05/15/2022)  Housing: Low Risk  (05/15/2022)  Transportation Needs: No Transportation Needs (05/11/2022)  Utilities: Not At Risk (05/15/2022)  Tobacco Use: Medium Risk (05/17/2022)     Readmission Risk Interventions    05/18/2022    2:25 PM  Readmission Risk Prevention Plan  Transportation Screening Complete  PCP or Specialist Appt within 3-5 Days Complete  HRI or [place] Complete  Social Work Consult for [place] Planning/Counseling Complete  Palliative Care Screening Not Applicable  Medication Review Press photographer) Complete

## 2022-05-18 NOTE — Progress Notes (Signed)
Occupational Therapy Treatment Patient Details Name: Lee Reilly MRN: [id number] DOB: June 04, 1985 Today's Date: 05/18/2022   History of present illness Pt is a 37 y.o. male s/p I&D L ankle ulcer 2/23 and then d/c home with wound vac. 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   OT comments  Patient long sitting in bed with mother present. Reviewed skin checks with phone with patient. Patient performed rolling side to side in bed and examined back while long sitting. Discussed bowel and bladder schedule and patient states that digital stimulation is less effective and ask about alternative, electrical devices that may assist. Therapist is unaware of such devices. Acute OT to continue to follow.    Recommendations for follow up therapy are one component of a multi-disciplinary discharge planning process, led by the attending physician.  Recommendations may be updated based on patient status, additional functional criteria and insurance authorization.    Follow Up Recommendations  Home health OT     Assistance Recommended at Discharge Intermittent Supervision/Assistance  Patient can return home with the following  Other (comment);Assist for transportation;Assistance with cooking/housework   Equipment Recommendations  Hospital bed;Other (comment)    Recommendations for Other Services      Precautions / Restrictions Precautions Precautions: Other (comment) Precaution Comments: wound vac left hip Restrictions Weight Bearing Restrictions: No       Mobility Bed Mobility Overal bed mobility: Modified Independent             General bed mobility comments: able to change positions in be to allow for skin checks with phone    Transfers Overall  transfer level: Modified independent                 General transfer comment: declined getting OOB     Balance Overall balance assessment: Modified Independent                                         ADL either performed or assessed with clinical judgement   ADL Overall ADL's : Needs assistance/impaired;At baseline                                       General ADL Comments: focused on performing skin checks in bed    Extremity/Trunk Assessment              Vision       Perception     Praxis      Cognition Arousal/Alertness: Awake/alert Behavior During Therapy: WFL for tasks assessed/performed Overall Cognitive Status: Within Functional Limits for tasks assessed                                 General Comments: Patient eager to return home        Exercises      Shoulder Instructions       General Comments      Pertinent Vitals/ Pain       Pain Assessment Pain Assessment: No/denies pain  Home Living  Prior Functioning/Environment              Frequency  Min 2X/week        Progress Toward Goals  OT Goals(current goals can now be found in the care plan section)  Progress towards OT goals: Progressing toward goals  Acute Rehab OT Goals Patient Stated Goal: go home OT Goal Formulation: With patient Time For Goal Achievement: 05/25/22 Potential to Achieve Goals: Good ADL Goals Additional ADL Goal #1: Pt will demonstrate ability to check his skin from head to toe in bed with skin check mirror independently. Additional ADL Goal #2: Pt with nursing and OT will discuss and speak of advantages of getting pt on a bowel and bladder program which will in turn assist with pts skin care. Additional ADL Goal #3: Pt will state 3 reasons why it is important to check his skin head to toe at least two times daily.  Plan Discharge plan remains  appropriate    Co-evaluation                 AM-PAC OT "6 Clicks" Daily Activity     Outcome Measure   Help from another person eating meals?: None Help from another person taking care of personal grooming?: None Help from another person toileting, which includes using toliet, bedpan, or urinal?: Total Help from another person bathing (including washing, rinsing, drying)?: None Help from another person to put on and taking off regular upper body clothing?: None Help from another person to put on and taking off regular lower body clothing?: None 6 Click Score: 21    End of Session    OT Visit Diagnosis: Other (comment) (positioning, skin care/montitorig)   Activity Tolerance Patient tolerated treatment well   Patient Left in bed;with call bell/phone within reach;with family/visitor present   Nurse Communication Mobility status        Time: 1202-1213 OT Time Calculation (min): 11 min  Charges: OT General Charges $OT Visit: 1 Visit OT Treatments $Therapeutic Activity: 8-22 mins  Lodema Hong, OTA Acute Rehabilitation Services  Office [phone]   Trixie Dredge 05/18/2022, 1:19 PM

## 2022-05-18 NOTE — Progress Notes (Signed)
Pt PICC removed per order and protocol. Per MD pt not going home with PICC and ok to remove. Pt catheter intact with the tip unremarkable, no drainage or discharge noted on assessment. Clean sterile dsg applied to site per protocol. VSS. Pt in bed with call light within reach. Will continue to monitor pt till discharge. Francis Gaines Zury Fazzino RN   05/18/22 1403  Vitals  Temp 97.7 F (36.5 C)  Temp Source Oral  BP (!) 104/51  MAP (mmHg) 66  BP Location Left Arm  BP Method Automatic  Patient Position (if appropriate) Lying  Pulse Rate (!) 118  Pulse Rate Source Monitor  Resp 18  MEWS COLOR  MEWS Score Color Yellow  Oxygen Therapy  SpO2 100 %  O2 Device Room Air  MEWS Score  MEWS Temp 0  MEWS Systolic 0  MEWS Pulse 2  MEWS RR 0  MEWS LOC 0  MEWS Score 2

## 2022-05-18 NOTE — Progress Notes (Signed)
Patient TOC medications delivered to room.

## 2022-05-18 NOTE — Progress Notes (Signed)
[facility] for Infectious Disease  Date of Admission:  05/10/2022           Reason for visit: Follow up on left hip osteomyelitis  Current antibiotics: Vancomycin Cefepime Metronidazole  ASSESSMENT:    37 y.o. 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  Operative cultures growing Providencia stuartii.  He was off antibiotics since 05/11/22 prior to the OR.  Perineal, thigh, buttock HS with small perianal abscess: Seen previously by general surgery.  No intervention recommended at that time. Chronic left ankle ulcer: Status post debridement 05/04/22 with cultures growing Pseudomonas aeruginosa, Providencia stuartii, and Bacteroides fragilis. Paraplegia.  RECOMMENDATIONS:    Will plan to switch to oral antibiotics with ciprofloxacin 500 mg twice daily metronidazole 500 mg twice daily Wound care Will follow cultures peripherally and adjust as needed as an outpatient Okay to discharge from [facility] for 4 weeks of antibiotics from date of his Girdlestone procedure with end date of 06/13/2022 Will arrange for ID video visit to review cultures   Principal Problem:   Osteomyelitis of left hip (HCC) Active Problems:   Paraplegia ([place])   Hypokalemia   Microcytic anemia   Pressure injury of skin    MEDICATIONS:    Scheduled Meds:  sodium chloride   Intravenous Once   sodium chloride   Intravenous Once   vitamin C  1,000 mg Oral Daily   Chlorhexidine Gluconate Cloth  6 each Topical Daily   dextromethorphan-guaiFENesin  1 tablet Oral BID   docusate sodium  100 mg Oral Daily   melatonin  3 mg Oral QHS   metroNIDAZOLE  500 mg Oral Q12H   midodrine  5 mg Oral TID WC   mupirocin ointment   Topical BID   pantoprazole  40 mg Oral Daily   sodium hypochlorite   Irrigation BID   zinc sulfate  220 mg Oral Daily   Continuous Infusions:  sodium chloride  Stopped (05/17/22 0115)   sodium chloride     ceFEPime (MAXIPIME) IV 2 g (05/18/22 [id number])   magnesium sulfate bolus IVPB     vancomycin 750 mg (05/17/22 1945)   PRN Meds:.acetaminophen, alum & mag hydroxide-simeth, bisacodyl, guaiFENesin-dextromethorphan, hydrALAZINE, HYDROcodone-acetaminophen, HYDROmorphone (DILAUDID) injection, labetalol, magnesium citrate, magnesium sulfate bolus IVPB, metoprolol tartrate, ondansetron, oxyCODONE, oxyCODONE, phenol, polyethylene glycol, sodium chloride flush, zolpidem  SUBJECTIVE:   24 hour events:  No acute events noted Tmax 99.2 No new imaging Tissue cultures from his left hip are showing Providencia stuartii  Patient reports that he would really like to go home today.  He says he can take better care of himself at home.  He would also prefer oral antibiotics as opposed to IV.  Review of Systems  All other systems reviewed and are negative.     OBJECTIVE:   Blood pressure 102/61, pulse 100, temperature 98.1 F (36.7 C), temperature source Oral, resp. rate 15, SpO2 100 %. There is no height or weight on file to calculate BMI.  Physical Exam Constitutional:      Appearance: Normal appearance.  HENT:     Head: Normocephalic and atraumatic.  Eyes:     Extraocular Movements: Extraocular movements intact.     Conjunctiva/sclera: Conjunctivae normal.  Abdominal:     General: There is no distension.     Palpations: Abdomen is soft.  Neurological:     General: No focal deficit present.  Mental Status: He is alert and oriented to person, place, and time.  Psychiatric:        Mood and Affect: Mood normal.        Behavior: Behavior normal.      Lab Results: Lab Results  Component Value Date   WBC 12.8 (H) 05/17/2022   HGB 8.0 (L) 05/17/2022   HCT 25.3 (L) 05/17/2022   MCV 79.9 (L) 05/17/2022   PLT 311 05/17/2022    Lab Results  Component Value Date   NA 133 (L) 05/17/2022   K 3.4 (L) 05/17/2022   CO2 25 05/17/2022   GLUCOSE  94 05/17/2022   BUN 5 (L) 05/17/2022   CREATININE 0.46 (L) 05/17/2022   CALCIUM 7.5 (L) 05/17/2022   GFRNONAA >60 05/17/2022    Lab Results  Component Value Date   ALT 26 05/11/2022   AST 26 05/11/2022   ALKPHOS 86 05/11/2022   BILITOT 0.8 05/11/2022       Component Value Date/Time   CRP 15.1 (H) 05/11/2022 0931       Component Value Date/Time   ESRSEDRATE 138 (H) 05/11/2022 0931     I have reviewed the micro and lab results in Epic.  Imaging: No results found.   Imaging independently reviewed in Epic.    Raynelle Highland for Infectious Disease [facility] [phone] pager 05/18/2022, 8:21 AM

## 2022-05-18 NOTE — Discharge Summary (Signed)
Physician Discharge Summary  Plumer Nein [id number] DOB: January 07, 1986 DOA: 05/10/2022  PCP: Patient, No Pcp Per  Admit date: 05/10/2022 Discharge date: 05/18/2022 Recommendations for Outpatient Follow-up:  Follow up with PCP in 1 weeks-call for appointment Please obtain BMP/CBC in one week Follow-up with Sharol Given, infectious disease as outpatient  Discharge Dispo: home Discharge Condition: Stable Code Status:   Code Status: Full Code Diet recommendation:  Diet Order             Diet regular Room service appropriate? Yes; Fluid consistency: Thin  Diet effective now                    Brief/Interim Summary: 37 y.o. 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 Wheelchair-bound at baseline with chronic left hip wound. Recent left ankle I&D (05/04/22) for OM and ulcer and discharged on 05/05/22 with 30 day course of doxycycline due to negative culture. However, on 05/07/22 he was prescribed a 20 day course of cipro as cultures returned with (providencia stuartii, pseudomonas, and bacteroides).   He presented to his PCP office on 2/26 for follow up and WBC noted to be 22.7; (up from 2/23 was 24.8) and WBC on 2/29 at [facility] noted to be 16.7. He was referred to wound care outpatient, wound cultures were obtained (reportedly positive but cannot see organisms), and imaging ordered via xray of B/L hips. Appears he then presented to [facility] due to positive wound cultures and leukocytosis.   MRI left hip which reveled multiple findings subcutaneous abscess involving perineum, inner thigh. Wound noted in lateral hip measuring 4.6 x 5.3 cm with cellulitis involving gluteal muscle and osteomyelitis of greater trochanter. Case was discussed with Dr. Sharol Given who recommended transfer to [facility] for  multi-disciplinary approach to likely include ortho, general surgery, ID, and possibly plastic surgery. He was started on vancomycin and Zosyn. CRP noted to be elevated at 155 which is actually slightly down from 178 on 26 February Patient was admitted patient had episode of hypotension and anemia needing blood transfusion.  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 Sharol Given.  Again had anemia hypotension tachycardia that subsequently stabilized with additional blood transfusions IV fluid boluses and midodrine. Overall wound culture growing Proteus.  Seen by infectious disease and informed that he is okay for discharge on  antibiotic:cipro/flagyl X 4 weeks from the OR on 3/6.  Per Dr. 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 Vancomycin, cefepime, flagyl resumed post op-had been off antibiotics since 05/11/2022.  OR culture growing few procidentia Stuartil> PICC line in place due to limited venous access ID following-Overall wound culture growing Proteus.  Seen by infectious disease and informed that he is okay for discharge on  antibiotic:cipro/flagyl X 4 weeks from the OR on 3/6.  To discontinue PICC line at the time of discharge.  Has chronic leukocytosis fluctuating, reviewed with ID and okay for discharge  Chronic left ankle ulcer:s/p debridemnt 2/23- pseudomonas aerugonisa, providencia stuaratii and bacteroids in  culture Small perineal abscess,thigh buttock wound: Seen in MRI at outside facility incidentally, seen by general surgery thought to be chronic based on their examination,likely secondary to hidradenitis suppurativa.   Surgery aspirated small amount from the penile area and did not advise surgical intervention.  Hypokalemia/hypomagnesemia: Repleted  Sinus tachycardia Acute on chronic hypotension Lactic acidosis due to hypotension: Patient was given IV fluid boluses 1 L x 2, albumin and also received 3 units PRBC 3/6-3/7.  Started on midodrine BP doing well in low 100 heart rate stable appears baseline.  Lactic acidosis resolved. he does have chronic hypertension  ABLA in the setting of Girdlestone procedure and loss in wound vac Chronic microcytic anemia: S/P total 4 units PRBC and stable  Left kidney staghorn calculus with hydroureteronephrosis: With a stable renal function will need to be addressed by urology outpatient. Paraplegia:Chronic history of neurogenic bladder.  Continue frequent position changes, appropriate mattress.  Consults: ID, Dr. Sharol Given Subjective: Alert oriented resting comfortably, eager to go home today  Discharge Exam: Vitals:   05/18/22 0742 05/18/22 1136  BP: 102/61 (!) 94/59  Pulse: 100 100  Resp: 15 16  Temp: 98.1 F (36.7 C) 97.6 F (36.4 C)  SpO2: 100% 98%   General: Pt is alert, awake, not in acute distress Cardiovascular: RRR, S1/S2 +, no rubs, no gallops Respiratory: CTA bilaterally, no wheezing, no rhonchi Abdominal: Soft, NT, ND, bowel sounds + Extremities: no edema, paraplegia Discharge Instructions  Discharge Instructions     Discharge instructions   Complete by: As directed    Please call call MD or return to ER for similar or worsening recurring problem that brought you to hospital or if any fever,nausea/vomiting,abdominal pain, uncontrolled pain, chest pain,  shortness of breath or any other alarming symptoms.  Please follow-up your doctor as instructed in a week time and call the office for appointment.  Please avoid alcohol, smoking, or any other illicit substance and maintain healthy habits including taking your regular medications as  prescribed.  You were cared for by a hospitalist during your hospital stay. If you have any questions about your discharge medications or the care you received while you were in the hospital after you are discharged, you can call the unit and ask to speak with the hospitalist on call if the hospitalist that took care of you is not available.  Once you are discharged, your primary care physician will handle any further medical issues. Please note that NO REFILLS for any discharge medications will be authorized once you are discharged, as it is imperative that you return to your primary care physician (or establish a relationship with a primary care physician if you do not have one) for your aftercare needs so that they can reassess your need for medications and monitor your lab values   Discharge wound care:   Complete by: As directed    dry dressing changes to wound on the hip and the heel as needed  Continue wound care destruction as per Dr. Sharol Given   Increase activity slowly   Complete by: As directed       Allergies as of 05/18/2022   No Known Allergies      Medication List     STOP taking these medications    doxycycline 100 MG tablet Commonly known as: VIBRA-TABS       TAKE these medications    ascorbic acid 1000 MG tablet Commonly known as: VITAMIN C Take 1 tablet (1,000 mg total) by mouth daily. Start taking  on: May 19, 2022   ciprofloxacin 500 MG tablet Commonly known as: CIPRO Take 1 tablet (500 mg total) by mouth 2 (two) times daily for 26 days.   docusate sodium 100 MG capsule Commonly known as: COLACE Take 1 capsule (100 mg total) by mouth daily. Start taking on: May 19, 2022   ferrous sulfate 325 (65 FE) MG tablet Take 1 tablet (325 mg total) by mouth daily.   guaiFENesin 600 MG 12 hr tablet Commonly known as: MUCINEX Take 600 mg by mouth 2 (two) times daily as needed for cough (congestion).   melatonin 3 MG Tabs tablet Take 1 tablet (3 mg total) by  mouth at bedtime.   metroNIDAZOLE 500 MG tablet Commonly known as: FLAGYL Take 1 tablet (500 mg total) by mouth every 12 (twelve) hours for 26 days.   midodrine 5 MG tablet Commonly known as: PROAMATINE Take 1 tablet (5 mg total) by mouth 3 (three) times daily with meals.   oxyCODONE-acetaminophen 5-325 MG tablet Commonly known as: PERCOCET/ROXICET Take 1 tablet by mouth every 4 (four) hours as needed.   VITAMIN B-12 PO Take 5 mLs by mouth daily.   Zinc Sulfate 220 (50 Zn) MG Tabs Take 1 tablet (220 mg total) by mouth daily. Start taking on: May 19, 2022               Durable Medical Equipment  (From admission, onward)           Start     Ordered   05/17/22 1220  For home use only DME Hospital bed  Once       Question Answer Comment  Length of Need Lifetime   Patient has (list medical condition): paraplegic, wounds   The above medical condition requires: Patient requires the ability to reposition frequently   Bed type Semi-electric   Support Surface: Low Air loss Mattress      05/17/22 1225   05/17/22 1216  For home use only DME Hospital bed  Once       Question Answer Comment  Length of Need Lifetime   The above medical condition requires: Patient requires the ability to reposition frequently   Bed type Semi-electric      05/17/22 1215              Discharge Care Instructions  (From admission, onward)           Start     Ordered   05/18/22 0000  Discharge wound care:       Comments: dry dressing changes to wound on the hip and the heel as needed  Continue wound care destruction as per Dr. Sharol Given   05/18/22 [facility] [employer] Follow up.   Why: (Adapt)- hospital bed arranged w/ air mattress- to be delivered to home Contact information: [address] [place] [address] [phone]         Newt Minion, MD Follow up in 1 week(s).   Specialty: Orthopedic Surgery Contact  information: [place] [address] [phone]                No Known Allergies  The results of significant diagnostics from this hospitalization (including imaging, microbiology, ancillary and laboratory) are listed below for reference.    Microbiology: Recent Results (from the past 240 hour(s))  MRSA Next Gen by PCR, Nasal  Status: None   Collection Time: 05/10/22 11:15 PM   Specimen: Nasal Mucosa; Nasal Swab  Result Value Ref Range Status   MRSA by PCR Next Gen NOT DETECTED NOT DETECTED Final    Comment: (NOTE) The GeneXpert MRSA Assay (FDA approved for NASAL specimens only), is one component of a comprehensive MRSA colonization surveillance program. It is not intended to diagnose MRSA infection nor to guide or monitor treatment for MRSA infections. Test performance is not FDA approved in patients less than 87 years old. Performed at [facility], [place] [address], [place], [place] [address]   MRSA Next Gen by PCR, Nasal     Status: None   Collection Time: 05/15/22  7:54 PM  Result Value Ref Range Status   MRSA by PCR Next Gen NOT DETECTED NOT DETECTED Final    Comment: (NOTE) The GeneXpert MRSA Assay (FDA approved for NASAL specimens only), is one component of a comprehensive MRSA colonization surveillance program. It is not intended to diagnose MRSA infection nor to guide or monitor treatment for MRSA infections. Test performance is not FDA approved in patients less than 55 years old. Performed at [facility], [place] [address] [place], [place] [address]   Aerobic/Anaerobic Culture w Gram Stain (surgical/deep wound)     Status: None (Preliminary result)   Collection Time: 05/16/22 12:30 PM   Specimen: Soft Tissue, Other  Result Value Ref Range Status   Specimen Description TISSUE  Final   Special Requests HIP  Final   Gram Stain NO WBC SEEN NO ORGANISMS SEEN   Final   Culture   Final    FEW PROVIDENCIA STUARTII CULTURE  REINCUBATED FOR BETTER GROWTH Performed at [facility], [address], [place], [place] [address]    Report Status PENDING  Incomplete   Organism ID, Bacteria PROVIDENCIA STUARTII  Final      Susceptibility   Providencia stuartii - MIC*    AMPICILLIN RESISTANT Resistant     CEFEPIME <=0.12 SENSITIVE Sensitive     CEFTAZIDIME <=1 SENSITIVE Sensitive     CEFTRIAXONE <=0.25 SENSITIVE Sensitive     CIPROFLOXACIN <=0.25 SENSITIVE Sensitive     GENTAMICIN RESISTANT Resistant     IMIPENEM 2 SENSITIVE Sensitive     TRIMETH/SULFA <=20 SENSITIVE Sensitive     AMPICILLIN/SULBACTAM 16 INTERMEDIATE Intermediate     PIP/TAZO <=4 SENSITIVE Sensitive     * FEW PROVIDENCIA STUARTII    Procedures/Studies: [place] EKG SITE RITE  Result Date: 05/11/2022 If Site Rite image not attached, placement could not be confirmed due to current cardiac rhythm.   Labs: BNP (last 3 results) No results for input(s): "BNP" in the last 8760 hours. Basic Metabolic Panel: Recent Labs  Lab 05/12/22 0311 05/13/22 0439 05/15/22 0515 05/16/22 0411 05/17/22 0623 05/18/22 1140  NA 135 134* 132* 134* 133* 135  K 3.3* 4.2 3.3* 3.1* 3.4* 3.3*  CL 106 104 100 100 104 107  CO2 20* '23 23 25 25 '$ 19*  GLUCOSE 93 90 91 98 94 75  BUN <5* <5* 7 7 5* <5*  CREATININE 0.39* 0.40* 0.46* 0.41* 0.46* 0.46*  CALCIUM 7.9* 8.3* 8.1* 8.1* 7.5* 7.9*  MG 1.7  --  2.0 2.0  --   --   CBC: Recent Labs  Lab 05/16/22 0411 05/16/22 1405 05/16/22 2130 05/17/22 0623 05/17/22 2221 05/18/22 1140  WBC 15.4* 30.5* 15.7* 12.8*  --  17.8*  HGB 8.6* 7.0* 7.0* 7.3* 8.0* 8.2*  HCT 28.3* 23.0*  22.2* 23.4* 25.3* 25.9*  MCV 76.9* 78.2* 77.1* 79.9*  --  82.2  PLT 465* 452* 333 311  --  300    Recent Labs  Lab 05/16/22 1405 05/16/22 2130 05/17/22 0623 05/18/22 1140  WBC 30.5* 15.7* 12.8* 17.8*   Microbiology Recent Results (from the past 240 hour(s))  MRSA Next Gen by PCR, Nasal     Status: None   Collection Time: 05/10/22 11:15 PM    Specimen: Nasal Mucosa; Nasal Swab  Result Value Ref Range Status   MRSA by PCR Next Gen NOT DETECTED NOT DETECTED Final    Comment: (NOTE) The GeneXpert MRSA Assay (FDA approved for NASAL specimens only), is one component of a comprehensive MRSA colonization surveillance program. It is not intended to diagnose MRSA infection nor to guide or monitor treatment for MRSA infections. Test performance is not FDA approved in patients less than 78 years old. Performed at [facility], [place] [address], [place], [place] [address]   MRSA Next Gen by PCR, Nasal     Status: None   Collection Time: 05/15/22  7:54 PM  Result Value Ref Range Status   MRSA by PCR Next Gen NOT DETECTED NOT DETECTED Final    Comment: (NOTE) The GeneXpert MRSA Assay (FDA approved for NASAL specimens only), is one component of a comprehensive MRSA colonization surveillance program. It is not intended to diagnose MRSA infection nor to guide or monitor treatment for MRSA infections. Test performance is not FDA approved in patients less than 73 years old. Performed at [facility], [place] [address], [place], [place] [address]   Aerobic/Anaerobic Culture w Gram Stain (surgical/deep wound)     Status: None (Preliminary result)   Collection Time: 05/16/22 12:30 PM   Specimen: Soft Tissue, Other  Result Value Ref Range Status   Specimen Description TISSUE  Final   Special Requests HIP  Final   Gram Stain NO WBC SEEN NO ORGANISMS SEEN   Final   Culture   Final    FEW PROVIDENCIA STUARTII CULTURE REINCUBATED FOR BETTER GROWTH Performed at [facility], [address], [place], [place] [address]    Report Status PENDING  Incomplete   Organism ID, Bacteria PROVIDENCIA STUARTII  Final      Susceptibility   Providencia stuartii - MIC*    AMPICILLIN RESISTANT Resistant     CEFEPIME <=0.12 SENSITIVE Sensitive     CEFTAZIDIME <=1 SENSITIVE Sensitive     CEFTRIAXONE <=0.25 SENSITIVE Sensitive      CIPROFLOXACIN <=0.25 SENSITIVE Sensitive     GENTAMICIN RESISTANT Resistant     IMIPENEM 2 SENSITIVE Sensitive     TRIMETH/SULFA <=20 SENSITIVE Sensitive     AMPICILLIN/SULBACTAM 16 INTERMEDIATE Intermediate     PIP/TAZO <=4 SENSITIVE Sensitive     * FEW PROVIDENCIA STUARTII   Time coordinating discharge: 35 minutes  SIGNED: Antonieta Pert, MD  [facility] 05/18/2022, 1:14 PM  If 7PM-7AM, please contact night-coverage [email or web address]

## 2022-05-21 ENCOUNTER — Telehealth: Payer: Self-pay

## 2022-05-21 DIAGNOSIS — M623 Immobility syndrome (paraplegic): Secondary | ICD-10-CM | POA: Diagnosis not present

## 2022-05-21 DIAGNOSIS — M869 Osteomyelitis, unspecified: Secondary | ICD-10-CM | POA: Diagnosis not present

## 2022-05-21 DIAGNOSIS — L97922 Non-pressure chronic ulcer of unspecified part of left lower leg with fat layer exposed: Secondary | ICD-10-CM | POA: Diagnosis not present

## 2022-05-21 DIAGNOSIS — L97929 Non-pressure chronic ulcer of unspecified part of left lower leg with unspecified severity: Secondary | ICD-10-CM | POA: Diagnosis not present

## 2022-05-21 LAB — AEROBIC/ANAEROBIC CULTURE W GRAM STAIN (SURGICAL/DEEP WOUND): Gram Stain: NONE SEEN

## 2022-05-21 NOTE — Transitions of Care (Post Inpatient/ED Visit) (Signed)
   05/21/2022  Name: Lee Reilly MRN: [id number] DOB: 1985-10-13  The patient was given information about care management services as a benefit of their Medicaid health plan today.    Primary Caregiver agreed to services and verbal consent obtained.   BSW has schedule patient with RNCM and BSW.  Mickel Fuchs, BSW, [place] Managed Medicaid Team  [phone]

## 2022-05-21 NOTE — Transitions of Care (Post Inpatient/ED Visit) (Signed)
   05/21/2022  Name: Lee Reilly MRN: [id number] DOB: 09-08-1985  Today's TOC FU Call Status: Today's TOC FU Call Status:: Successful TOC FU Call Competed TOC FU Call Complete Date: 05/21/22  Transition Care Management Follow-up Telephone Call Date of Discharge: 05/20/22 [facility]charge [facility]) Type of Discharge: Inpatient Admission Primary Inpatient Discharge Diagnosis:: HIP How have you been since you were released from the hospital?: Better Any questions or concerns?: Yes Patient Questions/Concerns:: I need some help getting him care in the home  Items Reviewed: Did you receive and understand the discharge instructions provided?: Yes Dietary orders reviewed?: NA Do you have support at home?: Yes People in Home: parent(s)  Home Care and Equipment/Supplies: [place] Ordered?: No  Functional Questionnaire: Do you need assistance with bathing/showering or dressing?: Yes Do you need assistance with meal preparation?: Yes Do you need assistance with eating?: Yes Do you have difficulty maintaining continence: Yes Do you need assistance with getting out of bed/getting out of a chair/moving?: Yes Do you have difficulty managing or taking your medications?: Yes  Folllow up appointments reviewed: PCP Follow-up appointment confirmed?: Yes Date of PCP follow-up appointment?: 05/29/22 Specialist Hospital Follow-up appointment confirmed?: Yes Date of Specialist follow-up appointment?: 05/31/22 Do you need transportation to your follow-up appointment?: No Do you understand care options if your condition(s) worsen?: Yes-patient verbalized understanding   Mickel Fuchs, BSW, [place] Medicaid Team  [phone]

## 2022-05-22 ENCOUNTER — Telehealth: Payer: Self-pay

## 2022-05-22 NOTE — Patient Outreach (Signed)
BSW received a voicemail from mom. BSW contacted mom back and she stated she contact [place] and they informed her patient was dismissed from the practice. She states patient goes to [place]. She did contact [place] and was able to speak with a case manager that will assist her with getting in home services for patient. BSW will follow up with mom on 06/25/22.   Mickel Fuchs, BSW, [place] Managed Medicaid Team  [phone]

## 2022-05-25 ENCOUNTER — Other Ambulatory Visit: Payer: No Typology Code available for payment source

## 2022-05-25 NOTE — Patient Outreach (Signed)
  Medicaid Managed Care   Unsuccessful Outreach Note  05/25/2022 Name: Lee Reilly MRN: [id number] DOB: 1985/07/18  Referred by: Patient, No Pcp Per Reason for referral : High Risk Managed Medicaid (MM social work telephone outreach )   An unsuccessful telephone outreach was attempted today. The patient was referred to the case management team for assistance with care management and care coordination.   Follow Up Plan: A HIPAA compliant phone message was left for the patient providing contact information and requesting a return call.   Mickel Fuchs, BSW, [place] Managed Medicaid Team  [phone]

## 2022-05-25 NOTE — Patient Outreach (Signed)
Medicaid Managed Care Social Work Note  05/25/2022 Name:  Deontrez Slavens MRN:  [id number] DOB:  08-Feb-1986  Rosevelt Flatley is an 37 y.o. year old male who is a primary patient of Patient, No Pcp Per.  The [facility] Managed Care Coordination team was consulted for assistance with:  Level of Care Concerns  Mr. Sharer was given information about Medicaid Managed Care Coordination team services today. Montez Morita Primary Caregiver agreed to services and verbal consent obtained.  Engaged with patient  for by telephone forfollow up visit in response to referral for case management and/or care coordination services.   Assessments/Interventions:  Review of past medical history, allergies, medications, health status, including review of consultants reports, laboratory and other test data, was performed as part of comprehensive evaluation and provision of chronic care management services.  SDOH: (Social Determinant of Health) assessments and interventions performed:   Advanced Directives Status:  Not addressed in this encounter.  Care Plan                 No Known Allergies  Medications Reviewed Today     Reviewed by Randa Ngo, RN (Registered Nurse) on 05/16/22 at 46  Med List Status: Complete   Medication Order Taking? Sig Documenting Provider Last Dose Status Informant  ciprofloxacin (CIPRO) 500 MG tablet [id number] No Take 1 tablet (500 mg total) by mouth 2 (two) times daily.  Patient not taking: Reported on 05/11/2022   Newt Minion, MD Not Taking Active Self, Pharmacy Records  Cyanocobalamin (VITAMIN B-12 PO) [id number] Yes Take 5 mLs by mouth daily. [provider] 05/09/2022 Active Self  doxycycline (VIBRA-TABS) 100 MG tablet [id number] Yes Take 1 tablet (100 mg total) by mouth 2 (two) times daily. Newt Minion, MD 05/10/2022 Active Self, Pharmacy Records  ferrous sulfate 325 (65 FE) MG tablet [id number] Yes Take 1 tablet (325 mg total) by mouth daily. Shelly Coss, MD  05/09/2022 Active Self  guaiFENesin (MUCINEX) 600 MG 12 hr tablet [id number] Yes Take 600 mg by mouth 2 (two) times daily as needed for cough (congestion). [provider] 05/10/2022 Active Self  Med List Note Dessie Coma, CPhT 05/29/21 1427): Patient's 1st name is pronounced "ahh-shish"            Patient Active Problem List   Diagnosis Date Noted   Osteomyelitis of left hip ([place]) 05/10/2022   Skin ulcer of left lower leg with fat layer exposed ([place]) 05/04/2022   Ulcer of left lower leg ([place]) 05/04/2022   Ankle wound, left, subsequent encounter 06/27/2021   Medication monitoring encounter 06/27/2021   Abscess of right thigh    Septic arthritis of right foot (HCC)    Subacute osteomyelitis, left ankle and foot ([place])    Leukocytosis    Pressure injury of skin 05/30/2021   Hypokalemia 05/29/2021   Microcytic anemia 05/29/2021   Protein-calorie malnutrition, severe ([place]) 05/29/2021   PCP NOTES >>>>>>>>>>>>>>>>>>>>>>>>>>>> 06/13/2015   Annual physical exam 05/01/2013   Acne 05/01/2013   Substance abuse ([place]) 09/05/2011   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   Paraplegia ([place]) 04/08/2007   Asthma 04/08/2007   Neurogenic bladder 04/08/2007   HIDRADENITIS SUPPURATIVA 04/08/2007   BSW completed a telephone outreach with patients mom. She stated she did hear from [facility] and a nurse will be coming out to complete the assessment of 3/29. Mom states she would like someone in the household sooner than 3/29. Mom states she goes to check on him in the mornings and at night. BSW informed mom  if she wanted to pay out of pocket for [facility] agency she could go that route, but if she did not want to pay she would need to wait until 3/29. Mom asked for BSW to send her a list of [place] agencies to [email or web address]. Conditions to be addressed/monitored per PCP order:   level of care  There are no care plans that you recently modified to display for this patient.   Follow up:  Patient agrees to Care Plan  and Follow-up.  Plan: The Managed Medicaid care management team will reach out to the patient again over the next 30 days.  Date/time of next scheduled Social Work care management/care coordination outreach:  06/25/22  Mickel Fuchs, [place] Miss, [place] Medicaid Team  [phone]

## 2022-05-25 NOTE — Patient Instructions (Addendum)
  Medicaid Managed Care   Unsuccessful Outreach Note  05/25/2022 Name: Lee Reilly MRN: [id number] DOB: [birth year]  Referred by: Patient, No Pcp Per Reason for referral : High Risk Managed Medicaid (MM social work telephone outreach )   An unsuccessful telephone outreach was attempted today. The patient was referred to the case management team for assistance with care management and care coordination.   Follow Up Plan: A HIPAA compliant phone message was left for the patient providing contact information and requesting a return call.   Lee Reilly, BSW, [place]  High Risk Managed Medicaid Team  [phone] Visit Information  Lee Reilly was given information about Medicaid Managed Care team care coordination services as a part of their [facility] Medicaid benefit. Lee Reilly verbally consented to engagement with the [facility] Managed Care team.   If you are experiencing a medical emergency, please call 911 or report to your local emergency department or urgent care.   If you have a non-emergency medical problem during routine business hours, please contact your provider's office and ask to speak with a nurse.   For questions related to your [facility] health plan, please call: [phone] or go here:[email or web address]  If you would like to schedule transportation through your [employer] plan, please call the following number at least 2 days in advance of your appointment: [phone].  You can also use the MTM portal or MTM mobile app to manage your rides. For the portal, please go to [email or web address].  Call the [place] at [phone], at any time, 24 hours a day, 7 days a week. If you are in danger or need immediate medical attention call 911.  If you would like help to quit smoking, call 1-800-QUIT-NOW 604-271-4835) OR Espaol: 1-855-Djelo-Ya HD:1601594) o para ms informacin haga clic  aqu or Text READY to 200-400 to register via text  Lee Reilly - following are the goals we discussed in your visit today:   Goals Addressed   None     Social Worker will follow up on 06/25/22.   Lee Reilly, BSW, [place] Managed Medicaid Team  [phone]   Following is a copy of your plan of care:  There are no care plans that you recently modified to display for this patient.

## 2022-05-28 ENCOUNTER — Other Ambulatory Visit: Payer: No Typology Code available for payment source | Admitting: *Deleted

## 2022-05-28 ENCOUNTER — Encounter: Payer: Self-pay | Admitting: *Deleted

## 2022-05-28 NOTE — Patient Outreach (Signed)
Care Coordination  05/28/2022  Lee Reilly 13-Nov-1985 [id number]   RNCM spoke to patient's mother, [name], today. [name] was on her way over to [name] home. She would prefer RNCM call and speak to [name] regarding Case Management needs. However, she explained to this RNCM that she is concerned about his care at home. He has not been receiving any assistance at home. She feels that he would benefit from having care in the home. RNCM scheduled to call patient on 05/30/22 at 2:30pm. RNCM updated the chart with current PCP, which is a [facility] provider. Advised to contact [facility] and provide updated information regarding PCP. Also advised to contact PCP and request referral for PCS. RNCM sent collaboration to [facility] Liaison regarding [name].  Lurena Joiner RN, BSN [place]  [employer] manager

## 2022-05-29 ENCOUNTER — Other Ambulatory Visit: Payer: Self-pay

## 2022-05-29 ENCOUNTER — Encounter: Payer: Self-pay | Admitting: Internal Medicine

## 2022-05-29 ENCOUNTER — Ambulatory Visit (INDEPENDENT_AMBULATORY_CARE_PROVIDER_SITE_OTHER): Payer: No Typology Code available for payment source | Admitting: Internal Medicine

## 2022-05-29 VITALS — BP 106/60 | HR 94 | Temp 98.5°F

## 2022-05-29 DIAGNOSIS — L97929 Non-pressure chronic ulcer of unspecified part of left lower leg with unspecified severity: Secondary | ICD-10-CM | POA: Diagnosis not present

## 2022-05-29 DIAGNOSIS — M869 Osteomyelitis, unspecified: Secondary | ICD-10-CM | POA: Diagnosis not present

## 2022-05-29 NOTE — Assessment & Plan Note (Signed)
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  He is on antibiotics as above due to his left hip osteomyelitis.

## 2022-05-29 NOTE — Assessment & Plan Note (Signed)
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  Will complete 4 weeks of antibiotics post amputation on 06/13/22.  His wound has some drainage associated with it right now and hopefully this will subside more with his wound care, keeping the area clean, off loading as able, and continued antibiotics.  He says he'll change the dressing when he gets home and sees Dr Sharol Given in a couple days.  He can follow up here as needed but we are available as needed if any further assistance needed.
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
## 2022-05-30 ENCOUNTER — Other Ambulatory Visit: Payer: No Typology Code available for payment source | Admitting: *Deleted

## 2022-05-30 NOTE — Patient Instructions (Signed)
Visit Information  Mr. Lee Reilly  - as a part of your Medicaid benefit, you are eligible for care management and care coordination services at no cost or copay. I was unable to reach you by phone today but would be happy to help you with your health related needs. Please feel free to call me @ [phone].   A member of the Managed Medicaid care management team will reach out to you again over the next 7 days.   Lurena Joiner RN, BSN [place]  [employer] manager

## 2022-05-30 NOTE — Patient Outreach (Signed)
  Medicaid Managed Care   Unsuccessful Attempt Note   05/30/2022 Name: Rual Boltz MRN: [id number] DOB: Oct 25, 1985  Referred by: Mindi Curling, PA-C Reason for referral : High Risk Managed Medicaid (Unsuccessful RNCM outreach)   An unsuccessful telephone outreach was attempted today. The patient was referred to the case management team for assistance with care management and care coordination.    Follow Up Plan: A HIPAA compliant phone message was left for the patient providing contact information and requesting a return call. and The Managed Medicaid care management team will reach out to the patient again over the next 7 days.    Lurena Joiner RN, BSN [place]  [employer] manager

## 2022-05-31 ENCOUNTER — Ambulatory Visit: Payer: No Typology Code available for payment source | Admitting: Orthopedic Surgery

## 2022-05-31 ENCOUNTER — Encounter: Payer: Self-pay | Admitting: Orthopedic Surgery

## 2022-05-31 DIAGNOSIS — S91002A Unspecified open wound, left ankle, initial encounter: Secondary | ICD-10-CM

## 2022-05-31 DIAGNOSIS — M869 Osteomyelitis, unspecified: Secondary | ICD-10-CM

## 2022-05-31 MED ORDER — SILVER SULFADIAZINE 1 % EX CREA: 1.0000 | TOPICAL_CREAM | Freq: Every day | CUTANEOUS | 3 refills | Status: DC
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
## 2022-06-01 ENCOUNTER — Telehealth: Payer: Self-pay | Admitting: Orthopedic Surgery

## 2022-06-01 NOTE — Telephone Encounter (Signed)
I called and sw pt's mother to advise that he does not have to use Mepilex dressing he can use 4x4 and tape. I gave him the sample of what I applied yesterday because he wanted to see if he could order from [employer]. Dry dressing is ok for hip and use silvadene to the left ankle and change both dressings daily. Verbalized understanding and will call with any questions.

## 2022-06-01 NOTE — Telephone Encounter (Signed)
Patient's mother [name] called advised she could not find the Mepilex Border that Dr. Sharol Given told patient to use. [name] asked for the link to order it. [name] said she can not find it on [employer].   The number to contact [name] is [phone]

## 2022-06-04 ENCOUNTER — Telehealth: Payer: Self-pay

## 2022-06-04 ENCOUNTER — Other Ambulatory Visit (HOSPITAL_COMMUNITY): Payer: Self-pay

## 2022-06-04 DIAGNOSIS — S71002D Unspecified open wound, left hip, subsequent encounter: Secondary | ICD-10-CM | POA: Diagnosis not present

## 2022-06-04 DIAGNOSIS — G8220 Paraplegia, unspecified: Secondary | ICD-10-CM | POA: Diagnosis not present

## 2022-06-04 DIAGNOSIS — M869 Osteomyelitis, unspecified: Secondary | ICD-10-CM | POA: Diagnosis not present

## 2022-06-04 DIAGNOSIS — S91002D Unspecified open wound, left ankle, subsequent encounter: Secondary | ICD-10-CM | POA: Diagnosis not present

## 2022-06-04 DIAGNOSIS — Z09 Encounter for follow-up examination after completed treatment for conditions other than malignant neoplasm: Secondary | ICD-10-CM | POA: Diagnosis not present

## 2022-06-04 DIAGNOSIS — D62 Acute posthemorrhagic anemia: Secondary | ICD-10-CM | POA: Diagnosis not present

## 2022-06-04 NOTE — Telephone Encounter (Signed)
..   Medicaid Managed Care   Unsuccessful Outreach Note  06/04/2022 Name: Lee Reilly MRN: [id number] DOB: 10/08/1985  Referred by: Mindi Curling, PA-C Reason for referral : Appointment (I called the patient today to reschedule his missed phone appt with the MM RNCM. I left my name and number on his VM.)   A second unsuccessful telephone outreach was attempted today. The patient was referred to the case management team for assistance with care management and care coordination.   Follow Up Plan: The care management team will reach out to the patient again over the next 7 days.   [place]  [phone]

## 2022-06-05 ENCOUNTER — Telehealth: Payer: Self-pay

## 2022-06-05 NOTE — Telephone Encounter (Signed)
..  Patient declines further follow up and engagement by the Managed Medicaid Team. Appropriate care team members and provider have been notified via electronic communication. The Managed Medicaid Team is available to follow up with the patient after provider conversation with the patient regarding recommendation for engagement and subsequent re-referral to the Managed Medicaid Team.    [place]  [phone]

## 2022-06-11 DIAGNOSIS — Z419 Encounter for procedure for purposes other than remedying health state, unspecified: Secondary | ICD-10-CM | POA: Diagnosis not present

## 2022-06-11 DIAGNOSIS — R32 Unspecified urinary incontinence: Secondary | ICD-10-CM | POA: Diagnosis not present

## 2022-06-19 ENCOUNTER — Encounter: Payer: Self-pay | Admitting: Orthopedic Surgery

## 2022-06-19 ENCOUNTER — Ambulatory Visit: Payer: No Typology Code available for payment source | Admitting: Orthopedic Surgery

## 2022-06-19 DIAGNOSIS — S91002A Unspecified open wound, left ankle, initial encounter: Secondary | ICD-10-CM

## 2022-06-19 DIAGNOSIS — L89224 Pressure ulcer of left hip, stage 4: Secondary | ICD-10-CM | POA: Diagnosis not present

## 2022-06-19 DIAGNOSIS — M86172 Other acute osteomyelitis, left ankle and foot: Secondary | ICD-10-CM

## 2022-06-19 DIAGNOSIS — S91002S Unspecified open wound, left ankle, sequela: Secondary | ICD-10-CM

## 2022-06-19 DIAGNOSIS — G8220 Paraplegia, unspecified: Secondary | ICD-10-CM | POA: Diagnosis not present

## 2022-06-19 DIAGNOSIS — M869 Osteomyelitis, unspecified: Secondary | ICD-10-CM

## 2022-06-19 DIAGNOSIS — Z993 Dependence on wheelchair: Secondary | ICD-10-CM | POA: Diagnosis not present

## 2022-06-19 DIAGNOSIS — L89524 Pressure ulcer of left ankle, stage 4: Secondary | ICD-10-CM | POA: Diagnosis not present
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
## 2022-06-20 ENCOUNTER — Telehealth: Payer: Self-pay | Admitting: Orthopedic Surgery

## 2022-06-20 NOTE — Telephone Encounter (Signed)
I called and lm on vm to advise not to sleep on surgical side. Hip surgery and ankle surgery are on the same side so lay on the opposite side of the surgery. Can call with any questions.

## 2022-06-20 NOTE — Telephone Encounter (Signed)
Patient's mom called. Would like Autumn to call her. Would like to know how he can sleep without putting pressure on the wound?

## 2022-06-21 ENCOUNTER — Other Ambulatory Visit: Payer: Self-pay

## 2022-06-21 ENCOUNTER — Encounter (HOSPITAL_COMMUNITY): Payer: Self-pay | Admitting: Orthopedic Surgery

## 2022-06-21 NOTE — Anesthesia Preprocedure Evaluation (Addendum)
Anesthesia Evaluation  Patient identified by MRN, date of birth, ID band Patient awake    Reviewed: Allergy & Precautions, NPO status , Patient's Chart, lab work & pertinent test results  Airway Mallampati: II  TM Distance: >3 FB Neck ROM: Full    Dental no notable dental hx. (+) Missing, Dental Advisory Given   Pulmonary asthma , former smoker   Pulmonary exam normal breath sounds clear to auscultation       Cardiovascular Normal cardiovascular exam Rhythm:Regular Rate:Normal     Neuro/Psych T4 paraplegic   Neuromuscular disease    GI/Hepatic   Endo/Other    Renal/GU Lab Results      Component                Value               Date                      CREATININE               0.46 (L)            05/18/2022                BUN                      <5 (L)              05/18/2022                NA                       135                 05/18/2022                K                        3.3 (L)             05/18/2022                CL                       107                 05/18/2022                CO2                      19 (L)              05/18/2022                Musculoskeletal  (+) Arthritis ,    Abdominal   Peds  Hematology Lab Results      Component                Value               Date                      WBC                      17.8 (H)            05/18/2022  HGB                      8.2 (L)             05/18/2022                HCT                      25.9 (L)            05/18/2022                MCV                      82.2                05/18/2022                PLT                      300                 05/18/2022              Anesthesia Other Findings   Reproductive/Obstetrics                             Anesthesia Physical Anesthesia Plan  ASA: 2  Anesthesia Plan: General   Post-op Pain Management: Precedex   Induction:  Intravenous  PONV Risk Score and Plan: 3 and Treatment may vary due to age or medical condition, Midazolam, Dexamethasone and Ondansetron  Airway Management Planned: LMA  Additional Equipment: None  Intra-op Plan:   Post-operative Plan: Extubation in OR  Informed Consent: I have reviewed the patients History and Physical, chart, labs and discussed the procedure including the risks, benefits and alternatives for the proposed anesthesia with the patient or authorized representative who has indicated his/her understanding and acceptance.     Dental advisory given  Plan Discussed with: CRNA and Anesthesiologist  Anesthesia Plan Comments: (Osteomyelitis L Hip)        Anesthesia Quick Evaluation

## 2022-06-21 NOTE — Progress Notes (Addendum)
SDW call  Patient was given pre-op instructions over the phone. Patient verbalized understanding of instructions provided.     PCP -  Infectious Disease: Dr. Gwynn Burly   PPM/ICD - Denies   Chest x-ray - 05/29/2021 EKG -  05/11/2022 Stress Test - ECHO -  Cardiac Cath -   Sleep Study/sleep apnea/CPAP: Denies  Non-diabetic   Blood Thinner Instructions: Denies Aspirin Instructions:Denies   ERAS Protcol - Yes, clear liquids until 0700 PRE-SURGERY Ensure or G2- No   COVID TEST- n/a     Anesthesia review: No   Patient denies shortness of breath, fever, cough and chest pain over the phone call    Your procedure is scheduled on Friday, June 22, 2022  Report to [facility] Main Entrance "A" at  0730  A.M., then check in with the Admitting office.  Call this number if you have problems the morning of surgery:  [phone]   If you have any questions prior to your surgery date call [phone]: Open Monday-Friday 8am-4pm If you experience any cold or flu symptoms such as cough, fever, chills, shortness of breath, etc. between now and your scheduled surgery, please notify us at the above number     Remember:  Do not eat after midnight the night before your surgery  You may drink clear liquids until 0700    the morning of your surgery.   Clear liquids allowed are: Water, Non-Citrus Juices (without pulp), Carbonated Beverages, Clear Tea, Black Coffee ONLY (NO MILK, CREAM OR POWDERED CREAMER of any kind), and Gatorade   Take these medicines the morning of surgery with A SIP OF WATER:  Cipro, Flagyl  As of today, STOP taking any Aspirin (unless otherwise instructed by your surgeon) Aleve, Naproxen, Ibuprofen, Motrin, Advil, Goody's, BC's, all herbal medications, fish oil, and all vitamins.

## 2022-06-22 ENCOUNTER — Encounter (HOSPITAL_COMMUNITY): Payer: Self-pay | Admitting: Orthopedic Surgery

## 2022-06-22 ENCOUNTER — Ambulatory Visit (HOSPITAL_COMMUNITY): Payer: No Typology Code available for payment source | Admitting: Anesthesiology

## 2022-06-22 ENCOUNTER — Telehealth: Payer: Self-pay | Admitting: Orthopedic Surgery

## 2022-06-22 ENCOUNTER — Encounter (HOSPITAL_COMMUNITY)
Admission: RE | Disposition: A | Discharge status: Home / Self Care | Payer: Self-pay | Source: Home / Self Care | Attending: Orthopedic Surgery

## 2022-06-22 ENCOUNTER — Ambulatory Visit (HOSPITAL_BASED_OUTPATIENT_CLINIC_OR_DEPARTMENT_OTHER)
Admission: RE | Admit: 2022-06-22 | Discharge: 2022-06-22 | Disposition: A | Discharge status: Home / Self Care | Payer: No Typology Code available for payment source | Source: Home / Self Care | Attending: Orthopedic Surgery | Admitting: Orthopedic Surgery

## 2022-06-22 ENCOUNTER — Ambulatory Visit (HOSPITAL_BASED_OUTPATIENT_CLINIC_OR_DEPARTMENT_OTHER): Payer: No Typology Code available for payment source | Admitting: Anesthesiology

## 2022-06-22 DIAGNOSIS — M869 Osteomyelitis, unspecified: Secondary | ICD-10-CM | POA: Diagnosis not present

## 2022-06-22 DIAGNOSIS — X58XXXA Exposure to other specified factors, initial encounter: Secondary | ICD-10-CM | POA: Insufficient documentation

## 2022-06-22 DIAGNOSIS — M86172 Other acute osteomyelitis, left ankle and foot: Secondary | ICD-10-CM | POA: Insufficient documentation

## 2022-06-22 DIAGNOSIS — Z87891 Personal history of nicotine dependence: Secondary | ICD-10-CM | POA: Insufficient documentation

## 2022-06-22 DIAGNOSIS — T8781 Dehiscence of amputation stump: Secondary | ICD-10-CM | POA: Insufficient documentation

## 2022-06-22 DIAGNOSIS — Z833 Family history of diabetes mellitus: Secondary | ICD-10-CM | POA: Insufficient documentation

## 2022-06-22 DIAGNOSIS — G8220 Paraplegia, unspecified: Secondary | ICD-10-CM | POA: Insufficient documentation

## 2022-06-22 DIAGNOSIS — J45909 Unspecified asthma, uncomplicated: Secondary | ICD-10-CM | POA: Insufficient documentation

## 2022-06-22 DIAGNOSIS — N319 Neuromuscular dysfunction of bladder, unspecified: Secondary | ICD-10-CM | POA: Insufficient documentation

## 2022-06-22 DIAGNOSIS — T8132XA Disruption of internal operation (surgical) wound, not elsewhere classified, initial encounter: Secondary | ICD-10-CM

## 2022-06-22 DIAGNOSIS — M96840 Postprocedural hematoma of a musculoskeletal structure following a musculoskeletal system procedure: Secondary | ICD-10-CM | POA: Diagnosis not present

## 2022-06-22 HISTORY — PX: APPLICATION OF WOUND VAC: SHX5189

## 2022-06-22 HISTORY — PX: I & D EXTREMITY: SHX5045

## 2022-06-22 LAB — CBC
HCT: 39.1 % (ref 39.0–52.0)
Hemoglobin: 11.8 g/dL — ABNORMAL LOW (ref 13.0–17.0)
MCH: 25.3 pg — ABNORMAL LOW (ref 26.0–34.0)
MCHC: 30.2 g/dL (ref 30.0–36.0)
MCV: 83.9 fL (ref 80.0–100.0)
Platelets: 479 10*3/uL — ABNORMAL HIGH (ref 150–400)
RBC: 4.66 MIL/uL (ref 4.22–5.81)
RDW: 16 % — ABNORMAL HIGH (ref 11.5–15.5)
WBC: 13.6 10*3/uL — ABNORMAL HIGH (ref 4.0–10.5)
nRBC: 0 % (ref 0.0–0.2)

## 2022-06-22 LAB — AEROBIC/ANAEROBIC CULTURE W GRAM STAIN (SURGICAL/DEEP WOUND)

## 2022-06-22 LAB — BASIC METABOLIC PANEL
Anion gap: 11 (ref 5–15)
BUN: 7 mg/dL (ref 6–20)
CO2: 23 mmol/L (ref 22–32)
Calcium: 8.6 mg/dL — ABNORMAL LOW (ref 8.9–10.3)
Chloride: 100 mmol/L (ref 98–111)
Creatinine, Ser: 0.46 mg/dL — ABNORMAL LOW (ref 0.61–1.24)
GFR, Estimated: >60 mL/min (ref >60)
Glucose, Bld: 102 mg/dL — ABNORMAL HIGH (ref 70–99)
Potassium: 3.6 mmol/L (ref 3.5–5.1)
Sodium: 134 mmol/L — ABNORMAL LOW (ref 135–145)

## 2022-06-22 SURGERY — IRRIGATION AND DEBRIDEMENT EXTREMITY
Anesthesia: General | Laterality: Left

## 2022-06-22 MED ORDER — MIDAZOLAM HCL 2 MG/2ML IJ SOLN
INTRAMUSCULAR | Status: DC | PRN
  Administered 2022-06-22: 2 mg via INTRAVENOUS

## 2022-06-22 MED ORDER — PROPOFOL 10 MG/ML IV BOLUS
INTRAVENOUS | Status: DC | PRN
  Administered 2022-06-22: 180 mg via INTRAVENOUS

## 2022-06-22 MED ORDER — LIDOCAINE 2% (20 MG/ML) 5 ML SYRINGE
INTRAMUSCULAR | Status: AC
  Filled 2022-06-22: qty 10

## 2022-06-22 MED ORDER — CEFAZOLIN SODIUM-DEXTROSE 2-4 GM/100ML-% IV SOLN
2.0000 g | INTRAVENOUS | Status: AC
  Administered 2022-06-22: 2 g via INTRAVENOUS

## 2022-06-22 MED ORDER — CEFAZOLIN SODIUM-DEXTROSE 2-4 GM/100ML-% IV SOLN
INTRAVENOUS | Status: AC
  Filled 2022-06-22: qty 100

## 2022-06-22 MED ORDER — AMISULPRIDE (ANTIEMETIC) 5 MG/2ML IV SOLN: 10.0000 mg | Freq: Once | INTRAVENOUS | Status: DC | PRN

## 2022-06-22 MED ORDER — ONDANSETRON HCL 4 MG/2ML IJ SOLN: 4.0000 mg | Freq: Once | INTRAMUSCULAR | Status: DC | PRN

## 2022-06-22 MED ORDER — LIDOCAINE 2% (20 MG/ML) 5 ML SYRINGE
INTRAMUSCULAR | Status: DC | PRN
  Administered 2022-06-22: 100 mg via INTRAVENOUS

## 2022-06-22 MED ORDER — MIDAZOLAM HCL 2 MG/2ML IJ SOLN
INTRAMUSCULAR | Status: AC
  Filled 2022-06-22: qty 2

## 2022-06-22 MED ORDER — DEXMEDETOMIDINE HCL IN NACL 80 MCG/20ML IV SOLN
INTRAVENOUS | Status: DC | PRN
  Administered 2022-06-22: 10 ug via BUCCAL

## 2022-06-22 MED ORDER — ONDANSETRON HCL 4 MG/2ML IJ SOLN
INTRAMUSCULAR | Status: DC | PRN
  Administered 2022-06-22: 4 mg via INTRAVENOUS

## 2022-06-22 MED ORDER — LACTATED RINGERS IV SOLN: INTRAVENOUS | Status: DC

## 2022-06-22 MED ORDER — ACETAMINOPHEN 10 MG/ML IV SOLN: 1000.0000 mg | Freq: Once | INTRAVENOUS | Status: DC | PRN

## 2022-06-22 MED ORDER — CHLORHEXIDINE GLUCONATE 0.12 % MT SOLN
15.0000 mL | Freq: Once | OROMUCOSAL | Status: AC
  Administered 2022-06-22: 15 mL via OROMUCOSAL
  Filled 2022-06-22: qty 15

## 2022-06-22 MED ORDER — ORAL CARE MOUTH RINSE: 15.0000 mL | Freq: Once | OROMUCOSAL | Status: AC

## 2022-06-22 MED ORDER — PHENYLEPHRINE 80 MCG/ML (10ML) SYRINGE FOR IV PUSH (FOR BLOOD PRESSURE SUPPORT)
PREFILLED_SYRINGE | INTRAVENOUS | Status: DC | PRN
  Administered 2022-06-22 (×4): 200 ug via INTRAVENOUS

## 2022-06-22 MED ORDER — PHENYLEPHRINE 80 MCG/ML (10ML) SYRINGE FOR IV PUSH (FOR BLOOD PRESSURE SUPPORT)
PREFILLED_SYRINGE | INTRAVENOUS | Status: AC
  Filled 2022-06-22: qty 30

## 2022-06-22 MED ORDER — ALBUMIN HUMAN 5 % IV SOLN: INTRAVENOUS | Status: DC | PRN

## 2022-06-22 MED ORDER — FENTANYL CITRATE (PF) 250 MCG/5ML IJ SOLN
INTRAMUSCULAR | Status: AC
  Filled 2022-06-22: qty 5

## 2022-06-22 MED ORDER — FENTANYL CITRATE (PF) 100 MCG/2ML IJ SOLN
INTRAMUSCULAR | Status: DC | PRN
  Administered 2022-06-22: 50 ug via INTRAVENOUS
  Administered 2022-06-22: 100 ug via INTRAVENOUS

## 2022-06-22 MED ORDER — HYDROMORPHONE HCL 1 MG/ML IJ SOLN: 0.2500 mg | INTRAMUSCULAR | Status: DC | PRN

## 2022-06-22 MED ORDER — OXYCODONE HCL 5 MG/5ML PO SOLN: 5.0000 mg | Freq: Once | ORAL | Status: DC | PRN

## 2022-06-22 MED ORDER — 0.9 % SODIUM CHLORIDE (POUR BTL) OPTIME
TOPICAL | Status: DC | PRN
  Administered 2022-06-22: 1000 mL

## 2022-06-22 MED ORDER — OXYCODONE HCL 5 MG PO TABS: 5.0000 mg | ORAL_TABLET | Freq: Once | ORAL | Status: DC | PRN

## 2022-06-22 MED ORDER — DEXAMETHASONE SODIUM PHOSPHATE 10 MG/ML IJ SOLN
INTRAMUSCULAR | Status: DC | PRN
  Administered 2022-06-22: 4 mg via INTRAVENOUS

## 2022-06-22 SURGICAL SUPPLY — 47 items
ADH SKN CLS APL DERMABOND .7 (GAUZE/BANDAGES/DRESSINGS) ×8
BAG COUNTER SPONGE SURGICOUNT (BAG) IMPLANT
BAG SPNG CNTER NS LX DISP (BAG)
BLADE SAW SAG 90X13X1.27 (BLADE) IMPLANT
BLADE SURG 21 STRL SS (BLADE) ×3 IMPLANT
BNDG CMPR 5X6 CHSV STRCH STRL (GAUZE/BANDAGES/DRESSINGS)
BNDG COHESIVE 6X5 TAN ST LF (GAUZE/BANDAGES/DRESSINGS) IMPLANT
BNDG GAUZE DERMACEA FLUFF 4 (GAUZE/BANDAGES/DRESSINGS) ×6 IMPLANT
BNDG GZE DERMACEA 4 6PLY (GAUZE/BANDAGES/DRESSINGS)
CANISTER PREVENA PLUS 150 (CANNISTER) IMPLANT
CANISTER WOUND CARE 500ML ATS (WOUND CARE) IMPLANT
COVER SURGICAL LIGHT HANDLE (MISCELLANEOUS) ×6 IMPLANT
DERMABOND ADVANCED .7 DNX12 (GAUZE/BANDAGES/DRESSINGS) IMPLANT
DRAPE DERMATAC (DRAPES) IMPLANT
DRAPE INCISE IOBAN 66X45 STRL (DRAPES) IMPLANT
DRAPE U-SHAPE 47X51 STRL (DRAPES) ×3 IMPLANT
DRESSING PREVENA PLUS CUSTOM (GAUZE/BANDAGES/DRESSINGS) IMPLANT
DRESSING VERAFLO CLEANSE CC (GAUZE/BANDAGES/DRESSINGS) IMPLANT
DRSG ADAPTIC 3X8 NADH LF (GAUZE/BANDAGES/DRESSINGS) ×3 IMPLANT
DRSG PREVENA PLUS CUSTOM (GAUZE/BANDAGES/DRESSINGS) ×2
DRSG VERAFLO CLEANSE CC (GAUZE/BANDAGES/DRESSINGS) ×2
DURAPREP 26ML APPLICATOR (WOUND CARE) ×3 IMPLANT
ELECT REM PT RETURN 9FT ADLT (ELECTROSURGICAL)
ELECTRODE REM PT RTRN 9FT ADLT (ELECTROSURGICAL) IMPLANT
GAUZE SPONGE 4X4 12PLY STRL (GAUZE/BANDAGES/DRESSINGS) ×3 IMPLANT
GLOVE BIOGEL PI IND STRL 9 (GLOVE) ×3 IMPLANT
GLOVE SURG ORTHO 9.0 STRL STRW (GLOVE) ×3 IMPLANT
GOWN STRL REUS W/ TWL XL LVL3 (GOWN DISPOSABLE) ×6 IMPLANT
GOWN STRL REUS W/TWL XL LVL3 (GOWN DISPOSABLE) ×4
GRAFT SKIN WND OMEGA3 SB 7X10 (Tissue) IMPLANT
GRAFT SKIN WND SURGICLOSE M95 (Tissue) IMPLANT
HANDPIECE INTERPULSE COAX TIP (DISPOSABLE)
KIT BASIN OR (CUSTOM PROCEDURE TRAY) ×3 IMPLANT
KIT DRSG PREVENA PLUS 7DAY 125 (MISCELLANEOUS) IMPLANT
KIT TURNOVER KIT B (KITS) ×3 IMPLANT
MANIFOLD NEPTUNE II (INSTRUMENTS) ×3 IMPLANT
NS IRRIG 1000ML POUR BTL (IV SOLUTION) ×3 IMPLANT
PACK ORTHO EXTREMITY (CUSTOM PROCEDURE TRAY) ×3 IMPLANT
PAD ARMBOARD 7.5X6 YLW CONV (MISCELLANEOUS) ×6 IMPLANT
SET HNDPC FAN SPRY TIP SCT (DISPOSABLE) IMPLANT
STOCKINETTE IMPERVIOUS 9X36 MD (GAUZE/BANDAGES/DRESSINGS) IMPLANT
SUT ETHILON 2 0 PSLX (SUTURE) ×3 IMPLANT
SWAB COLLECTION DEVICE MRSA (MISCELLANEOUS) ×3 IMPLANT
SWAB CULTURE ESWAB REG 1ML (MISCELLANEOUS) IMPLANT
TOWEL GREEN STERILE (TOWEL DISPOSABLE) ×3 IMPLANT
TUBE CONNECTING 12X1/4 (SUCTIONS) ×3 IMPLANT
YANKAUER SUCT BULB TIP NO VENT (SUCTIONS) ×3 IMPLANT

## 2022-06-22 NOTE — Anesthesia Procedure Notes (Signed)
Procedure Name: LMA Insertion Date/Time: 06/22/2022 9:22 AM  Performed by: Jodell Cipro, CRNAPre-anesthesia Checklist: Patient identified, Emergency Drugs available, Suction available and Patient being monitored Patient Re-evaluated:Patient Re-evaluated prior to induction Oxygen Delivery Method: Circle System Utilized Preoxygenation: Pre-oxygenation with 100% oxygen Induction Type: IV induction Ventilation: Mask ventilation without difficulty LMA: LMA inserted LMA Size: 5.0 Number of attempts: 1 Placement Confirmation: positive ETCO2 Tube secured with: Tape Dental Injury: Teeth and Oropharynx as per pre-operative assessment

## 2022-06-22 NOTE — Telephone Encounter (Signed)
Pending

## 2022-06-22 NOTE — Telephone Encounter (Signed)
I will set up [facility] services for patient. Will try [facility] or [facility] for services.

## 2022-06-22 NOTE — Interval H&P Note (Signed)
History and Physical Interval Note:  06/22/2022 9:07 AM  Lee Reilly  has presented today for surgery, with the diagnosis of Dehiscence Left Hip Wound.  The various methods of treatment have been discussed with the patient and family. After consideration of risks, benefits and other options for treatment, the patient has consented to  Procedure(s): LEFT HIP DEBRIDEMENT (Left) as a surgical intervention.  The patient's history has been reviewed, patient examined, no change in status, stable for surgery.  I have reviewed the patient's chart and labs.  Questions were answered to the patient's satisfaction.     Nadara Mustard

## 2022-06-22 NOTE — Transfer of Care (Signed)
Immediate Anesthesia Transfer of Care Note  Patient: Lee Reilly  Procedure(s) Performed: LEFT HIP DEBRIDEMENT (Left) APPLICATION OF WOUND VAC  Patient Location: PACU  Anesthesia Type:General  Level of Consciousness: awake, alert , and oriented  Airway & Oxygen Therapy: Patient Spontanous Breathing  Post-op Assessment: Report given to RN and Post -op Vital signs reviewed and stable  Post vital signs: Reviewed and stable  Last Vitals:  Vitals Value Taken Time  BP 88/57 06/22/22 1025  Temp    Pulse 127 06/22/22 1026  Resp 12 06/22/22 1026  SpO2 100 % 06/22/22 1026  Vitals shown include unvalidated device data.  Last Pain:  Vitals:   06/22/22 0833  TempSrc:   PainSc: 0-No pain      Patients Stated Pain Goal: 3 (06/22/22 2778)  Complications: No notable events documented.

## 2022-06-22 NOTE — Telephone Encounter (Signed)
Sent referral to [facility] awaiting reply

## 2022-06-22 NOTE — Telephone Encounter (Signed)
Patients mother states that Dr. Lajoyce Corners said to schedule home health therapy he is being discharged today. Please call when done--[phone] [address] ) his mother.

## 2022-06-22 NOTE — Telephone Encounter (Signed)
[facility] does not have the nursing staff right now. I will send referral to [facility] and see if they have the staff.

## 2022-06-22 NOTE — Telephone Encounter (Signed)
The social worker at the hospital sets this up for pt.

## 2022-06-22 NOTE — Op Note (Signed)
06/22/2022  10:24 AM  PATIENT:  Lee Reilly    PRE-OPERATIVE DIAGNOSIS:  Dehiscence Left Hip Wound  POST-OPERATIVE DIAGNOSIS:  Same  PROCEDURE: Partial excision left proximal femur and left acetabulum.    Local tissue rearrangement for wound closure 20 x 6 cm.  Application of Kerecis micro graft 95 cm and Kerecis sheet 7 x 10 cm.    APPLICATION OF CUSTOMIZABLE WOUND VAC  SURGEON:  Nadara Mustard, MD  PHYSICIAN ASSISTANT:None ANESTHESIA:   General  PREOPERATIVE INDICATIONS:  Lee Reilly is a  38 y.o. male with a diagnosis of Dehiscence Left Hip Wound who failed conservative measures and elected for surgical management.    The risks benefits and alternatives were discussed with the patient preoperatively including but not limited to the risks of infection, bleeding, nerve injury, cardiopulmonary complications, the need for revision surgery, among others, and the patient was willing to proceed.  OPERATIVE IMPLANTS:   Implant Name Type Inv. Item Serial No. Manufacturer Lot No. LRB No. Used Action  GRAFT SKIN WND SURGICLOSE M95 - [id number] Tissue GRAFT SKIN WND SURGICLOSE M95  [employer] [id number] Left 1 Implanted  GRAFT SKIN WND OMEGA3 SB 7X10 - [id number] Tissue GRAFT SKIN WND OMEGA3 SB 7X10  [employer] [id number] Left 1 Implanted    @ENCIMAGES @  OPERATIVE FINDINGS: Good petechial bleeding tissue and bone sent for cultures.  OPERATIVE PROCEDURE: Patient was brought the operating room underwent a general anesthetic.  After adequate levels anesthesia obtained patient's left lower extremity was bumped up prepped using DuraPrep draped into a sterile field a timeout was called.  Elliptical incision was made around the area of wound dehiscence and this left a wound that was 20 x 6 cm.  The proximal 3 cm of femur was resected.  There was also bony resection from the acetabulum.  Electrocautery was used for hemostasis soft tissue was sent for culture  The wound was irrigated  with normal saline and further hemostasis was obtained.  The wound was filled with 95 cm of Kerecis micro graft this was covered with a 7 x 10 sheet cut lengthwise.  Local tissue rearrangement was then used to perform to close the wound that was 20 x 6 cm.  The wound margins were well-approximated.  A customizable wound VAC was applied this was covered with derma tack Ioband and secured with Dermabond.  There was a good suction fit patient was extubated taken the PACU in stable condition.   DISCHARGE PLANNING:  Antibiotic duration: Preoperative antibiotics will adjust antibiotics based on tissue culture sensitivity.  Weightbearing: Not applicable  Pain medication: Not applicable  Dressing care/ Wound VAC: Continue wound VAC  Ambulatory devices: Wheelchair  Discharge to: Home.  Follow-up: In the office 1 week post operative.

## 2022-06-22 NOTE — Anesthesia Postprocedure Evaluation (Signed)
Anesthesia Post Note  Patient: Lee Reilly  Procedure(s) Performed: LEFT HIP DEBRIDEMENT (Left) APPLICATION OF WOUND VAC     Patient location during evaluation: PACU Anesthesia Type: General Level of consciousness: awake and alert Pain management: pain level controlled Vital Signs Assessment: post-procedure vital signs reviewed and stable Respiratory status: spontaneous breathing, nonlabored ventilation, respiratory function stable and patient connected to nasal cannula oxygen Cardiovascular status: blood pressure returned to baseline and stable Postop Assessment: no apparent nausea or vomiting Anesthetic complications: no  No notable events documented.  Last Vitals:  Vitals:   06/22/22 1045 06/22/22 1100  BP: 98/62 (!) 95/57  Pulse: 93 99  Resp: (!) 21 18  Temp:  (!) 36.4 C  SpO2: 99% 100%    Last Pain:  Vitals:   06/22/22 1100  TempSrc:   PainSc: 0-No pain                 Trevor Iha
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
## 2022-06-23 LAB — AEROBIC/ANAEROBIC CULTURE W GRAM STAIN (SURGICAL/DEEP WOUND)

## 2022-06-25 ENCOUNTER — Encounter (HOSPITAL_COMMUNITY)
Admission: EM | Disposition: A | Discharge status: Home / Self Care | Payer: Self-pay | Source: Home / Self Care | Attending: Orthopaedic Surgery

## 2022-06-25 ENCOUNTER — Emergency Department (HOSPITAL_COMMUNITY): Payer: No Typology Code available for payment source

## 2022-06-25 ENCOUNTER — Encounter (HOSPITAL_COMMUNITY): Payer: Self-pay | Admitting: Anesthesiology

## 2022-06-25 ENCOUNTER — Other Ambulatory Visit: Payer: Self-pay

## 2022-06-25 ENCOUNTER — Inpatient Hospital Stay (HOSPITAL_COMMUNITY)
Admission: EM | Admit: 2022-06-25 | Discharge: 2022-06-30 | DRG: 904 | Disposition: A | Discharge status: Home Health | Payer: No Typology Code available for payment source | Attending: Orthopedic Surgery | Admitting: Orthopedic Surgery

## 2022-06-25 ENCOUNTER — Telehealth: Payer: Self-pay | Admitting: Orthopedic Surgery

## 2022-06-25 ENCOUNTER — Emergency Department (HOSPITAL_COMMUNITY): Payer: No Typology Code available for payment source | Admitting: Anesthesiology

## 2022-06-25 DIAGNOSIS — J45909 Unspecified asthma, uncomplicated: Secondary | ICD-10-CM | POA: Diagnosis not present

## 2022-06-25 DIAGNOSIS — T8489XA Other specified complication of internal orthopedic prosthetic devices, implants and grafts, initial encounter: Secondary | ICD-10-CM | POA: Diagnosis not present

## 2022-06-25 DIAGNOSIS — Z87891 Personal history of nicotine dependence: Secondary | ICD-10-CM | POA: Diagnosis not present

## 2022-06-25 DIAGNOSIS — D62 Acute posthemorrhagic anemia: Secondary | ICD-10-CM | POA: Insufficient documentation

## 2022-06-25 DIAGNOSIS — E8809 Other disorders of plasma-protein metabolism, not elsewhere classified: Secondary | ICD-10-CM | POA: Diagnosis present

## 2022-06-25 DIAGNOSIS — Y838 Other surgical procedures as the cause of abnormal reaction of the patient, or of later complication, without mention of misadventure at the time of the procedure: Secondary | ICD-10-CM | POA: Diagnosis present

## 2022-06-25 DIAGNOSIS — T8189XA Other complications of procedures, not elsewhere classified, initial encounter: Secondary | ICD-10-CM | POA: Diagnosis not present

## 2022-06-25 DIAGNOSIS — L89326 Pressure-induced deep tissue damage of left buttock: Secondary | ICD-10-CM | POA: Diagnosis present

## 2022-06-25 DIAGNOSIS — Z9889 Other specified postprocedural states: Secondary | ICD-10-CM | POA: Diagnosis not present

## 2022-06-25 DIAGNOSIS — Z7409 Other reduced mobility: Secondary | ICD-10-CM | POA: Diagnosis present

## 2022-06-25 DIAGNOSIS — L89896 Pressure-induced deep tissue damage of other site: Secondary | ICD-10-CM | POA: Diagnosis not present

## 2022-06-25 DIAGNOSIS — F17210 Nicotine dependence, cigarettes, uncomplicated: Secondary | ICD-10-CM

## 2022-06-25 DIAGNOSIS — T8781 Dehiscence of amputation stump: Secondary | ICD-10-CM | POA: Diagnosis not present

## 2022-06-25 DIAGNOSIS — M96840 Postprocedural hematoma of a musculoskeletal structure following a musculoskeletal system procedure: Principal | ICD-10-CM | POA: Diagnosis present

## 2022-06-25 DIAGNOSIS — L89626 Pressure-induced deep tissue damage of left heel: Secondary | ICD-10-CM | POA: Diagnosis not present

## 2022-06-25 DIAGNOSIS — D649 Anemia, unspecified: Secondary | ICD-10-CM

## 2022-06-25 DIAGNOSIS — Z981 Arthrodesis status: Secondary | ICD-10-CM | POA: Diagnosis not present

## 2022-06-25 DIAGNOSIS — R000 Tachycardia, unspecified: Secondary | ICD-10-CM | POA: Diagnosis not present

## 2022-06-25 DIAGNOSIS — S71002A Unspecified open wound, left hip, initial encounter: Secondary | ICD-10-CM

## 2022-06-25 DIAGNOSIS — L89526 Pressure-induced deep tissue damage of left ankle: Secondary | ICD-10-CM | POA: Diagnosis not present

## 2022-06-25 DIAGNOSIS — R159 Full incontinence of feces: Secondary | ICD-10-CM | POA: Diagnosis present

## 2022-06-25 DIAGNOSIS — J90 Pleural effusion, not elsewhere classified: Secondary | ICD-10-CM | POA: Diagnosis not present

## 2022-06-25 DIAGNOSIS — Z85858 Personal history of malignant neoplasm of other endocrine glands: Secondary | ICD-10-CM

## 2022-06-25 DIAGNOSIS — G8220 Paraplegia, unspecified: Secondary | ICD-10-CM | POA: Diagnosis not present

## 2022-06-25 DIAGNOSIS — N319 Neuromuscular dysfunction of bladder, unspecified: Secondary | ICD-10-CM | POA: Diagnosis present

## 2022-06-25 DIAGNOSIS — S7002XA Contusion of left hip, initial encounter: Secondary | ICD-10-CM | POA: Diagnosis present

## 2022-06-25 DIAGNOSIS — S71002D Unspecified open wound, left hip, subsequent encounter: Secondary | ICD-10-CM | POA: Diagnosis not present

## 2022-06-25 DIAGNOSIS — D500 Iron deficiency anemia secondary to blood loss (chronic): Principal | ICD-10-CM

## 2022-06-25 DIAGNOSIS — Z833 Family history of diabetes mellitus: Secondary | ICD-10-CM

## 2022-06-25 HISTORY — PX: HEMATOMA EVACUATION: SHX5118

## 2022-06-25 LAB — CBC WITH DIFFERENTIAL/PLATELET
Abs Immature Granulocytes: 0.07 10*3/uL (ref 0.00–0.07)
Basophils Absolute: 0.1 10*3/uL (ref 0.0–0.1)
Basophils Relative: 0 %
Eosinophils Absolute: 0.8 10*3/uL — ABNORMAL HIGH (ref 0.0–0.5)
Eosinophils Relative: 4 %
HCT: 24.8 % — ABNORMAL LOW (ref 39.0–52.0)
Hemoglobin: 7.5 g/dL — ABNORMAL LOW (ref 13.0–17.0)
Immature Granulocytes: 0 %
Lymphocytes Relative: 12 %
Lymphs Abs: 2.2 10*3/uL (ref 0.7–4.0)
MCH: 25.8 pg — ABNORMAL LOW (ref 26.0–34.0)
MCHC: 30.2 g/dL (ref 30.0–36.0)
MCV: 85.2 fL (ref 80.0–100.0)
Monocytes Absolute: 1.3 10*3/uL — ABNORMAL HIGH (ref 0.1–1.0)
Monocytes Relative: 7 %
Neutro Abs: 14 10*3/uL — ABNORMAL HIGH (ref 1.7–7.7)
Neutrophils Relative %: 77 %
Platelets: 431 10*3/uL — ABNORMAL HIGH (ref 150–400)
RBC: 2.91 MIL/uL — ABNORMAL LOW (ref 4.22–5.81)
RDW: 15.8 % — ABNORMAL HIGH (ref 11.5–15.5)
WBC: 18.4 10*3/uL — ABNORMAL HIGH (ref 4.0–10.5)
nRBC: 0 % (ref 0.0–0.2)

## 2022-06-25 LAB — BPAM RBC
Blood Product Expiration Date: 202404202359
Blood Product Expiration Date: 202404252359
ISSUE DATE / TIME: 202404150215
Unit Type and Rh: 5100
Unit Type and Rh: 7300
Unit Type and Rh: 7300

## 2022-06-25 LAB — TYPE AND SCREEN: Unit division: 0

## 2022-06-25 LAB — COMPREHENSIVE METABOLIC PANEL
ALT: 11 U/L (ref 0–44)
AST: 18 U/L (ref 15–41)
Albumin: 1.9 g/dL — ABNORMAL LOW (ref 3.5–5.0)
Alkaline Phosphatase: 53 U/L (ref 38–126)
Anion gap: 8 (ref 5–15)
BUN: 8 mg/dL (ref 6–20)
CO2: 24 mmol/L (ref 22–32)
Calcium: 7.8 mg/dL — ABNORMAL LOW (ref 8.9–10.3)
Chloride: 106 mmol/L (ref 98–111)
Creatinine, Ser: 0.44 mg/dL — ABNORMAL LOW (ref 0.61–1.24)
GFR, Estimated: >60 mL/min (ref >60)
Glucose, Bld: 100 mg/dL — ABNORMAL HIGH (ref 70–99)
Potassium: 3.6 mmol/L (ref 3.5–5.1)
Sodium: 138 mmol/L (ref 135–145)
Total Bilirubin: 0.3 mg/dL (ref 0.3–1.2)
Total Protein: 6.3 g/dL — ABNORMAL LOW (ref 6.5–8.1)

## 2022-06-25 LAB — PROTIME-INR
INR: 1.2 (ref 0.8–1.2)
Prothrombin Time: 14.9 seconds (ref 11.4–15.2)

## 2022-06-25 LAB — HEMOGLOBIN AND HEMATOCRIT, BLOOD
HCT: 34.2 % — ABNORMAL LOW (ref 39.0–52.0)
Hemoglobin: 11.7 g/dL — ABNORMAL LOW (ref 13.0–17.0)

## 2022-06-25 LAB — PREPARE RBC (CROSSMATCH)

## 2022-06-25 LAB — POCT I-STAT, CHEM 8
BUN: 7 mg/dL (ref 6–20)
Calcium, Ion: 1.13 mmol/L — ABNORMAL LOW (ref 1.15–1.40)
Chloride: 106 mmol/L (ref 98–111)
Creatinine, Ser: <0.2 mg/dL — ABNORMAL LOW (ref 0.61–1.24)
Glucose, Bld: 101 mg/dL — ABNORMAL HIGH (ref 70–99)
HCT: 26 % — ABNORMAL LOW (ref 39.0–52.0)
Hemoglobin: 8.8 g/dL — ABNORMAL LOW (ref 13.0–17.0)
Potassium: 3.9 mmol/L (ref 3.5–5.1)
Sodium: 141 mmol/L (ref 135–145)
TCO2: 23 mmol/L (ref 22–32)

## 2022-06-25 LAB — I-STAT CHEM 8, ED
BUN: 8 mg/dL (ref 6–20)
Calcium, Ion: 1.13 mmol/L — ABNORMAL LOW (ref 1.15–1.40)
Chloride: 105 mmol/L (ref 98–111)
Creatinine, Ser: 0.3 mg/dL — ABNORMAL LOW (ref 0.61–1.24)
Glucose, Bld: 93 mg/dL (ref 70–99)
HCT: 25 % — ABNORMAL LOW (ref 39.0–52.0)
Hemoglobin: 8.5 g/dL — ABNORMAL LOW (ref 13.0–17.0)
Potassium: 3.7 mmol/L (ref 3.5–5.1)
Sodium: 140 mmol/L (ref 135–145)
TCO2: 24 mmol/L (ref 22–32)

## 2022-06-25 LAB — APTT: aPTT: 36 seconds (ref 24–36)

## 2022-06-25 LAB — AEROBIC CULTURE W GRAM STAIN (SUPERFICIAL SPECIMEN)

## 2022-06-25 LAB — LACTIC ACID, PLASMA: Lactic Acid, Venous: 1.2 mmol/L (ref 0.5–1.9)

## 2022-06-25 SURGERY — EVACUATION HEMATOMA
Anesthesia: General | Laterality: Left

## 2022-06-25 MED ORDER — POLYETHYLENE GLYCOL 3350 17 G PO PACK: 17.0000 g | PACK | Freq: Every day | ORAL | Status: DC | PRN

## 2022-06-25 MED ORDER — ADULT MULTIVITAMIN W/MINERALS CH
1.0000 | ORAL_TABLET | Freq: Every day | ORAL | Status: DC
  Filled 2022-06-25: qty 1

## 2022-06-25 MED ORDER — METOCLOPRAMIDE HCL 5 MG/ML IJ SOLN: 5.0000 mg | Freq: Three times a day (TID) | INTRAMUSCULAR | Status: DC | PRN

## 2022-06-25 MED ORDER — SODIUM CHLORIDE 0.9 % IV BOLUS (SEPSIS)
1000.0000 mL | Freq: Once | INTRAVENOUS | Status: AC
  Administered 2022-06-25: 1000 mL via INTRAVENOUS

## 2022-06-25 MED ORDER — DOCUSATE SODIUM 100 MG PO CAPS
100.0000 mg | ORAL_CAPSULE | Freq: Two times a day (BID) | ORAL | Status: DC
  Filled 2022-06-25 (×3): qty 1

## 2022-06-25 MED ORDER — SODIUM CHLORIDE 0.9% IV SOLUTION: Freq: Once | INTRAVENOUS | Status: DC

## 2022-06-25 MED ORDER — MEPERIDINE HCL 25 MG/ML IJ SOLN: 6.2500 mg | INTRAMUSCULAR | Status: DC | PRN

## 2022-06-25 MED ORDER — CEFAZOLIN SODIUM-DEXTROSE 2-3 GM-%(50ML) IV SOLR
INTRAVENOUS | Status: DC | PRN
  Administered 2022-06-25: 2 g via INTRAVENOUS

## 2022-06-25 MED ORDER — FENTANYL CITRATE (PF) 250 MCG/5ML IJ SOLN
INTRAMUSCULAR | Status: AC
  Filled 2022-06-25: qty 5

## 2022-06-25 MED ORDER — PROPOFOL 10 MG/ML IV BOLUS
INTRAVENOUS | Status: AC
  Filled 2022-06-25: qty 20

## 2022-06-25 MED ORDER — HYDROMORPHONE HCL 1 MG/ML IJ SOLN: 0.2500 mg | INTRAMUSCULAR | Status: DC | PRN

## 2022-06-25 MED ORDER — MIDAZOLAM HCL 2 MG/2ML IJ SOLN
INTRAMUSCULAR | Status: AC
  Filled 2022-06-25: qty 2

## 2022-06-25 MED ORDER — MIDAZOLAM HCL 5 MG/5ML IJ SOLN
INTRAMUSCULAR | Status: DC | PRN
  Administered 2022-06-25: 2 mg via INTRAVENOUS

## 2022-06-25 MED ORDER — FLEET ENEMA 7-19 GM/118ML RE ENEM: 1.0000 | ENEMA | Freq: Once | RECTAL | Status: DC | PRN

## 2022-06-25 MED ORDER — SUGAMMADEX SODIUM 200 MG/2ML IV SOLN
INTRAVENOUS | Status: DC | PRN
  Administered 2022-06-25: 200 mg via INTRAVENOUS

## 2022-06-25 MED ORDER — SODIUM CHLORIDE 0.9 % IV SOLN: INTRAVENOUS | Status: DC | PRN

## 2022-06-25 MED ORDER — ACETAMINOPHEN 325 MG PO TABS: 325.0000 mg | ORAL_TABLET | Freq: Four times a day (QID) | ORAL | Status: DC | PRN

## 2022-06-25 MED ORDER — LACTATED RINGERS IV SOLN: INTRAVENOUS | Status: DC | PRN

## 2022-06-25 MED ORDER — PHENYLEPHRINE HCL (PRESSORS) 10 MG/ML IV SOLN
INTRAVENOUS | Status: DC | PRN
  Administered 2022-06-25: 80 ug via INTRAVENOUS

## 2022-06-25 MED ORDER — ROCURONIUM BROMIDE 100 MG/10ML IV SOLN
INTRAVENOUS | Status: DC | PRN
  Administered 2022-06-25: 60 mg via INTRAVENOUS

## 2022-06-25 MED ORDER — PROMETHAZINE HCL 25 MG/ML IJ SOLN: 6.2500 mg | INTRAMUSCULAR | Status: DC | PRN

## 2022-06-25 MED ORDER — HYDROCODONE-ACETAMINOPHEN 5-325 MG PO TABS
1.0000 | ORAL_TABLET | ORAL | Status: DC | PRN
  Filled 2022-06-25: qty 1

## 2022-06-25 MED ORDER — FENTANYL CITRATE (PF) 250 MCG/5ML IJ SOLN
INTRAMUSCULAR | Status: DC | PRN
  Administered 2022-06-25: 50 ug via INTRAVENOUS

## 2022-06-25 MED ORDER — ONDANSETRON HCL 4 MG/2ML IJ SOLN: 4.0000 mg | Freq: Four times a day (QID) | INTRAMUSCULAR | Status: DC | PRN

## 2022-06-25 MED ORDER — LIDOCAINE HCL (CARDIAC) PF 100 MG/5ML IV SOSY
PREFILLED_SYRINGE | INTRAVENOUS | Status: DC | PRN
  Administered 2022-06-25: 20 mg via INTRATRACHEAL

## 2022-06-25 MED ORDER — OXYCODONE HCL 5 MG PO TABS: 5.0000 mg | ORAL_TABLET | Freq: Once | ORAL | Status: DC | PRN

## 2022-06-25 MED ORDER — ONDANSETRON HCL 4 MG/2ML IJ SOLN
INTRAMUSCULAR | Status: DC | PRN
  Administered 2022-06-25: 4 mg via INTRAVENOUS

## 2022-06-25 MED ORDER — CEFAZOLIN SODIUM-DEXTROSE 1-4 GM/50ML-% IV SOLN
1.0000 g | Freq: Four times a day (QID) | INTRAVENOUS | Status: AC
  Administered 2022-06-25 (×3): 1 g via INTRAVENOUS
  Filled 2022-06-25 (×3): qty 50

## 2022-06-25 MED ORDER — METOCLOPRAMIDE HCL 5 MG PO TABS: 5.0000 mg | ORAL_TABLET | Freq: Three times a day (TID) | ORAL | Status: DC | PRN

## 2022-06-25 MED ORDER — ACETAMINOPHEN 500 MG PO TABS
500.0000 mg | ORAL_TABLET | Freq: Four times a day (QID) | ORAL | Status: AC
  Filled 2022-06-25: qty 1

## 2022-06-25 MED ORDER — MIDAZOLAM HCL 2 MG/2ML IJ SOLN: 0.5000 mg | Freq: Once | INTRAMUSCULAR | Status: DC | PRN

## 2022-06-25 MED ORDER — JUVEN PO PACK
1.0000 | PACK | Freq: Two times a day (BID) | ORAL | Status: DC
  Administered 2022-06-28: 1
  Filled 2022-06-25 (×3): qty 1

## 2022-06-25 MED ORDER — ETOMIDATE 2 MG/ML IV SOLN
INTRAVENOUS | Status: DC | PRN
  Administered 2022-06-25: 20 mg via INTRAVENOUS

## 2022-06-25 MED ORDER — ONDANSETRON HCL 4 MG PO TABS: 4.0000 mg | ORAL_TABLET | Freq: Four times a day (QID) | ORAL | Status: DC | PRN

## 2022-06-25 MED ORDER — OXYCODONE HCL 5 MG/5ML PO SOLN: 5.0000 mg | Freq: Once | ORAL | Status: DC | PRN

## 2022-06-25 MED ORDER — TRANEXAMIC ACID-NACL 1000-0.7 MG/100ML-% IV SOLN
1000.0000 mg | Freq: Once | INTRAVENOUS | Status: AC
  Administered 2022-06-25: 1000 mg via INTRAVENOUS
  Filled 2022-06-25: qty 100

## 2022-06-25 SURGICAL SUPPLY — 52 items
APL SKNCLS STERI-STRIP NONHPOA (GAUZE/BANDAGES/DRESSINGS) ×3
BAG COUNTER SPONGE SURGICOUNT (BAG) ×2 IMPLANT
BAG SPNG CNTER NS LX DISP (BAG)
BENZOIN TINCTURE PRP APPL 2/3 (GAUZE/BANDAGES/DRESSINGS) IMPLANT
BOWL SMART MIX CTS (DISPOSABLE) IMPLANT
CANISTER WOUNDNEG PRESSURE 500 (CANNISTER) IMPLANT
CATH ROBINSON RED A/P 12FR (CATHETERS) IMPLANT
COVER SURGICAL LIGHT HANDLE (MISCELLANEOUS) ×2 IMPLANT
DRAPE IMP U-DRAPE 54X76 (DRAPES) ×2 IMPLANT
DRAPE ORTHO SPLIT 77X108 STRL (DRAPES) ×2
DRAPE SURG ORHT 6 SPLT 77X108 (DRAPES) ×4 IMPLANT
DRAPE U-SHAPE 47X51 STRL (DRAPES) ×2 IMPLANT
DRESSING PEEL AND PLAC PRVNA20 (GAUZE/BANDAGES/DRESSINGS) IMPLANT
DRSG PEEL AND PLACE PREVENA 20 (GAUZE/BANDAGES/DRESSINGS) ×1
DURAPREP 26ML APPLICATOR (WOUND CARE) ×2 IMPLANT
ELECT CAUTERY BLADE 6.4 (BLADE) IMPLANT
ELECT REM PT RETURN 9FT ADLT (ELECTROSURGICAL)
ELECTRODE REM PT RTRN 9FT ADLT (ELECTROSURGICAL) IMPLANT
GAUZE PAD ABD 8X10 STRL (GAUZE/BANDAGES/DRESSINGS) ×2 IMPLANT
GAUZE SPONGE 4X4 12PLY STRL (GAUZE/BANDAGES/DRESSINGS) ×2 IMPLANT
GAUZE XEROFORM 5X9 LF (GAUZE/BANDAGES/DRESSINGS) ×2 IMPLANT
GLOVE BIOGEL PI IND STRL 8 (GLOVE) ×4 IMPLANT
GLOVE ORTHO TXT STRL SZ7.5 (GLOVE) ×4 IMPLANT
GOWN STRL REUS W/ TWL LRG LVL3 (GOWN DISPOSABLE) ×2 IMPLANT
GOWN STRL REUS W/ TWL XL LVL3 (GOWN DISPOSABLE) ×2 IMPLANT
GOWN STRL REUS W/TWL 2XL LVL3 (GOWN DISPOSABLE) ×2 IMPLANT
GOWN STRL REUS W/TWL LRG LVL3 (GOWN DISPOSABLE) ×1
GOWN STRL REUS W/TWL XL LVL3 (GOWN DISPOSABLE) ×1
HANDPIECE INTERPULSE COAX TIP (DISPOSABLE)
KIT BASIN OR (CUSTOM PROCEDURE TRAY) ×2 IMPLANT
KIT TURNOVER KIT B (KITS) ×2 IMPLANT
MANIFOLD NEPTUNE II (INSTRUMENTS) ×2 IMPLANT
NS IRRIG 1000ML POUR BTL (IV SOLUTION) ×2 IMPLANT
PACK TOTAL JOINT (CUSTOM PROCEDURE TRAY) ×2 IMPLANT
PACK UNIVERSAL I (CUSTOM PROCEDURE TRAY) ×2 IMPLANT
PAD ARMBOARD 7.5X6 YLW CONV (MISCELLANEOUS) ×4 IMPLANT
SET HNDPC FAN SPRY TIP SCT (DISPOSABLE) IMPLANT
SPONGE T-LAP 18X18 ~~LOC~~+RFID (SPONGE) ×2 IMPLANT
STAPLER VISISTAT 35W (STAPLE) ×2 IMPLANT
SUT ETHILON 2 0 PSLX (SUTURE) IMPLANT
SUT ETHILON 2 LR (SUTURE) IMPLANT
SUT PDS AB 1 CT  36 (SUTURE)
SUT PDS AB 1 CT 36 (SUTURE) IMPLANT
SUT VIC AB 0 CT1 27 (SUTURE)
SUT VIC AB 0 CT1 27XBRD ANBCTR (SUTURE) ×4 IMPLANT
SUT VIC AB 2-0 CT1 27 (SUTURE)
SUT VIC AB 2-0 CT1 TAPERPNT 27 (SUTURE) ×4 IMPLANT
SWAB CULTURE ESWAB REG 1ML (MISCELLANEOUS) IMPLANT
TOWEL GREEN STERILE (TOWEL DISPOSABLE) ×2 IMPLANT
TOWEL GREEN STERILE FF (TOWEL DISPOSABLE) ×2 IMPLANT
UNDERPAD 30X36 HEAVY ABSORB (UNDERPADS AND DIAPERS) ×2 IMPLANT
WATER STERILE IRR 1000ML POUR (IV SOLUTION) ×2 IMPLANT

## 2022-06-25 NOTE — Interval H&P Note (Signed)
History and Physical Interval Note:  06/25/2022 3:09 AM  Lee Reilly  has presented today for surgery, with the diagnosis of BLEEDING LEFT HIP WOUND.  The various methods of treatment have been discussed with the patient and family. After consideration of risks, benefits and other options for treatment, the patient has consented to  Procedure(s): EVACUATION HEMATOMA, CONTROL OF BLEEDING, HIP WOUND (Left) as a surgical intervention.  The patient's history has been reviewed, patient examined, no change in status, stable for surgery.  I have reviewed the patient's chart and labs.  Questions were answered to the patient's satisfaction.     Eldred Manges

## 2022-06-25 NOTE — ED Provider Notes (Signed)
[facility] AT [facility] Provider Note  CSN: [id number] Arrival date & time: 06/25/22 0040  Chief Complaint(s) Wound Dehiscence and Tachycardia  HPI Lee Reilly is a 36 y.o. 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  He subsequently had a recent wound dehiscence status post debridement and closure by Dr. Lajoyce Corners on 4/12 that had wound VAC placement.  He presents today for bleeding from the left hip incision site.  Patient reports that he has been trying to stay off of his left side as instructed.  Family noted significant amount of bleeding on the sheets and on the floor.  Patient has no sensation below the waist and denies any pain.  He denies any fall or trauma.  The history is provided by the patient.    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    Neurogenic bladder    has no control, occasional cath   Paraplegia    Tumor T4 @ age 70month, had surgery   Pneumonia    Tachycardia    Patient Active Problem List   Diagnosis Date Noted   Osteomyelitis of left hip 05/10/2022   Skin ulcer of left lower leg with fat layer exposed 05/04/2022   Ulcer of left lower leg 05/04/2022   Ankle wound, left, subsequent encounter 06/27/2021   Medication monitoring encounter 06/27/2021   Abscess of right thigh    Septic arthritis of right foot    Subacute osteomyelitis, left ankle and foot    Leukocytosis    Pressure injury of skin 05/30/2021   Hypokalemia 05/29/2021   Microcytic anemia 05/29/2021   Protein-calorie malnutrition, severe 05/29/2021   PCP NOTES >>>>>>>>>>>>>>>>>>>>>>>>>>>> 06/13/2015   Annual physical exam 05/01/2013   Acne 05/01/2013   Substance abuse 09/05/2011   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   Paraplegia  04/08/2007   Asthma 04/08/2007   Neurogenic bladder 04/08/2007   HIDRADENITIS SUPPURATIVA 04/08/2007   Home Medication(s) Prior to Admission medications   Medication Sig Start Date End Date Taking? Authorizing Provider  silver sulfADIAZINE (SILVADENE) 1 % cream Apply 1 Application topically daily. Apply to affected area daily plus dry dressing Patient taking differently: Apply 1 Application topically daily as needed (for ankle wound). Apply to affected area daily plus dry dressing for 05/31/22  Yes Nadara Mustard, MD  ciprofloxacin (CIPRO) 500 MG tablet Take 500 mg by mouth 2 (two) times daily. Patient not taking: Reported on 06/25/2022    [provider]  docusate sodium (COLACE) 100 MG capsule Take 1 capsule (100 mg total) by mouth daily. Patient not taking: Reported on 06/21/2022 05/19/22   Lanae Boast, MD  metroNIDAZOLE (FLAGYL) 500 MG tablet Take 500 mg by mouth 2 (two) times daily. Patient not taking: Reported on 06/25/2022    [provider]  oxyCODONE-acetaminophen (PERCOCET/ROXICET) 5-325 MG tablet Take 1 tablet by mouth every 4 (four) hours as needed. Patient not taking: Reported on 06/21/2022 05/18/22   Nadara Mustard, MD  Allergies Patient has no known allergies.  Review of Systems Review of Systems As noted in HPI  Physical Exam Vital Signs  I have reviewed the triage vital signs BP 106/65   Pulse (!) 121   Temp 98.8 F (37.1 C)   Resp 20   Ht 5\' 3"  (1.6 m)   Wt 54.4 kg   SpO2 100%   BMI 21.26 kg/m   Physical Exam Vitals reviewed.  Constitutional:      General: He is not in acute distress.    Appearance: He is well-developed. He is not diaphoretic.  HENT:     Head: Normocephalic and atraumatic.     Right Ear: External ear normal.     Left Ear: External ear normal.     Nose: Nose normal.     Mouth/Throat:     Mouth:  Mucous membranes are moist.  Eyes:     General: No scleral icterus.    Conjunctiva/sclera: Conjunctivae normal.  Neck:     Trachea: Phonation normal.  Cardiovascular:     Rate and Rhythm: Regular rhythm. Tachycardia present.  Pulmonary:     Effort: Pulmonary effort is normal. No respiratory distress.     Breath sounds: No stridor.  Abdominal:     General: There is no distension.  Musculoskeletal:     Cervical back: Normal range of motion.       Legs:  Neurological:     Mental Status: He is alert and oriented to person, place, and time.  Psychiatric:        Behavior: Behavior normal.     ED Results and Treatments Labs (all labs ordered are listed, but only abnormal results are displayed) Labs Reviewed  COMPREHENSIVE METABOLIC PANEL - Abnormal; Notable for the following components:      Result Value   Glucose, Bld 100 (*)    Creatinine, Ser 0.44 (*)    Calcium 7.8 (*)    Total Protein 6.3 (*)    Albumin 1.9 (*)    All other components within normal limits  CBC WITH DIFFERENTIAL/PLATELET - Abnormal; Notable for the following components:   WBC 18.4 (*)    RBC 2.91 (*)    Hemoglobin 7.5 (*)    HCT 24.8 (*)    MCH 25.8 (*)    RDW 15.8 (*)    Platelets 431 (*)    Neutro Abs 14.0 (*)    Monocytes Absolute 1.3 (*)    Eosinophils Absolute 0.8 (*)    All other components within normal limits  I-STAT CHEM 8, ED - Abnormal; Notable for the following components:   Creatinine, Ser 0.30 (*)    Calcium, Ion 1.13 (*)    Hemoglobin 8.5 (*)    HCT 25.0 (*)    All other components within normal limits  CULTURE, BLOOD (ROUTINE X 2)  CULTURE, BLOOD (ROUTINE X 2)  LACTIC ACID, PLASMA  PROTIME-INR  APTT  LACTIC ACID, PLASMA  TYPE AND SCREEN  PREPARE RBC (CROSSMATCH)  PREPARE RBC (CROSSMATCH)  PREPARE RBC (CROSSMATCH)  EKG  EKG  Interpretation  Date/Time:  Monday June 25 2022 00:56:41 EDT Ventricular Rate:  142 PR Interval:  130 QRS Duration: 86 QT Interval:  331 QTC Calculation: 509 R Axis:   70 Text Interpretation: Sinus tachycardia Abnormal R-wave progression, early transition Probable LVH with secondary repol abnrm Prolonged QT interval Artifact in lead(s) I II III aVR aVL aVF V1 V2 V3 V5 V6 Confirmed by Drema Pry [phone]) on 06/25/2022 2:36:50 AM       Radiology DG Chest Port 1 View  Result Date: 06/25/2022 CLINICAL DATA:  Tachycardia EXAM: PORTABLE CHEST 1 VIEW COMPARISON:  05/29/2021 FINDINGS: Stable appearance of the chest. Haziness of the left hemithorax, favoring overlying soft tissue rather than a pleural effusion as noted on the prior report. Right paramediastinal mass, unchanged. Right lung is otherwise clear. The heart is normal in size. Thoracolumbar spine fixation hardware. IMPRESSION: Right paramediastinal mass, unchanged from 2023. No prior CT is available for comparison. If this is not a known finding, CT chest with contrast is suggested for further evaluation. No acute cardiopulmonary disease. Electronically Signed   By: Charline Bills M.D.   On: 06/25/2022 01:46    Medications Ordered in ED Medications  0.9 %  sodium chloride infusion (Manually program via Guardrails IV Fluids) (0 mLs Intravenous Hold 06/25/22 0320)  0.9 %  sodium chloride infusion (Manually program via Guardrails IV Fluids) (0 mLs Intravenous Hold 06/25/22 0320)  0.9 %  sodium chloride infusion (Manually program via Guardrails IV Fluids) (has no administration in time range)  sodium chloride 0.9 % bolus 1,000 mL (0 mLs Intravenous Stopped 06/25/22 0308)                                                                                                                                     Procedures .1-3 Lead EKG Interpretation  Performed by: Nira Conn, MD Authorized by: Nira Conn, MD      Interpretation: abnormal     ECG rate:  165   ECG rate assessment: tachycardic     Rhythm: sinus tachycardia     Ectopy: none     Conduction: normal   .Critical Care  Performed by: Nira Conn, MD Authorized by: Nira Conn, MD   Critical care provider statement:    Critical care time (minutes):  45   Critical care time was exclusive of:  Separately billable procedures and treating other patients   Critical care was necessary to treat or prevent imminent or life-threatening deterioration of the following conditions:  Circulatory failure   Critical care was time spent personally by me on the following activities:  Development of treatment plan with patient or surrogate, discussions with consultants, evaluation of patient's response to treatment, examination of patient, obtaining history from patient or surrogate, review of old charts, re-evaluation of patient's condition, pulse oximetry, ordering and review of radiographic studies, ordering and review of laboratory studies  and ordering and performing treatments and interventions   Care discussed with: admitting provider     (including critical care time)  Medical Decision Making / ED Course  Click here for ABCD2, HEART and other calculators  Medical Decision Making Amount and/or Complexity of Data Reviewed Labs: ordered. Radiology: ordered.  Risk Prescription drug management. Decision regarding hospitalization.    Patient presents with active bleeding from his left wound. He is tachycardic with soft blood pressures. Large pressure dressing was applied to wound. CBC was notable for approximately 4-1/2 g drop in hemoglobin. Patient started on emergent blood transfusion. Orthopedic surgery consulted  Clinical Course as of 06/25/22 0336  Mon Jun 25, 2022  0230 Spoke with Dr. Kevan Ny who will come evaluate patient for likely operative management [PC]    Clinical Course User Index [PC] Blinda Turek, Amadeo Garnet,  MD   Patient was taken to the OR for operative management.   Final Clinical Impression(s) / ED Diagnoses Final diagnoses:  Blood loss anemia  Bleeding from left hip wound, initial encounter           This chart was dictated using voice recognition software.  Despite best efforts to proofread,  errors can occur which can change the documentation meaning.    Nira Conn, MD 06/25/22 [phone]

## 2022-06-25 NOTE — Anesthesia Preprocedure Evaluation (Addendum)
Anesthesia Evaluation  Patient identified by MRN, date of birth, ID band Patient awake    Reviewed: Allergy & Precautions, NPO status , Patient's Chart, lab work & pertinent test results  History of Anesthesia Complications Negative for: history of anesthetic complications  Airway Mallampati: I  TM Distance: >3 FB Neck ROM: Full    Dental  (+) Dental Advisory Given   Pulmonary asthma , Current Smoker (marijuana) and Patient abstained from smoking.   breath sounds clear to auscultation       Cardiovascular  Rhythm:Regular Rate:Normal  On mitodrine for BP support   Neuro/Psych T4 paraplegia: neuroblastoma ([place])    GI/Hepatic negative GI ROS,,,(+)     substance abuse  marijuana use  Endo/Other  negative endocrine ROS    Renal/GU negative Renal ROS Bladder dysfunction (neurogenic bladder)      Musculoskeletal  (+) Arthritis ,    Abdominal   Peds  Hematology  (+) Blood dyscrasia (Hb 8.5), anemia Now receiving 3rd unit of pRBCs   Anesthesia Other Findings hidradenitis suppurativa of perianal/right buttock/bilateral groin S/p hip disarticulation for infection, now with bleeding from site   Reproductive/Obstetrics                             Anesthesia Physical Anesthesia Plan  ASA: 3 and emergent  Anesthesia Plan: General   Post-op Pain Management: Ofirmev IV (intra-op)*   Induction: Intravenous and Rapid sequence  PONV Risk Score and Plan: 2 and Ondansetron and Dexamethasone  Airway Management Planned: Oral ETT  Additional Equipment: None  Intra-op Plan:   Post-operative Plan: Extubation in OR  Informed Consent: I have reviewed the patients History and Physical, chart, labs and discussed the procedure including the risks, benefits and alternatives for the proposed anesthesia with the patient or authorized representative who has indicated his/her understanding and  acceptance.     Dental advisory given  Plan Discussed with: CRNA and Surgeon  Anesthesia Plan Comments:        Anesthesia Quick Evaluation

## 2022-06-25 NOTE — Progress Notes (Signed)
Wound vac drained approx 250 ml serosanguinous fluid.  MD messaged via epic chat.

## 2022-06-25 NOTE — Telephone Encounter (Signed)
Patient's mom called. She would like to speak with Autumn about his care when he gets home. Her call back number is [phone]

## 2022-06-25 NOTE — Progress Notes (Signed)
Patient ID: Lee Reilly, male   DOB: 04-17-1985, 37 y.o.   MRN: [id number] Patient is status post repeat debridement by Dr. Ophelia Charter over the weekend with concern of active bleeding.  No active bleeding identified.  The wound VAC canister has a good suction fit with no drainage.  Discussed with patient we will maintain his inpatient status to ensure he does not develop another leak from the wound VAC dressing.

## 2022-06-25 NOTE — Anesthesia Procedure Notes (Signed)
Procedure Name: Intubation Date/Time: 06/25/2022 4:30 AM  Performed by: Claudina Lick, CRNAPre-anesthesia Checklist: Patient identified, Emergency Drugs available, Suction available and Patient being monitored Patient Re-evaluated:Patient Re-evaluated prior to induction Oxygen Delivery Method: Circle system utilized Preoxygenation: Pre-oxygenation with 100% oxygen Induction Type: IV induction and Cricoid Pressure applied Ventilation: Mask ventilation without difficulty Laryngoscope Size: Miller and 2 Grade View: Grade I Tube type: Oral Tube size: 7.5 mm Number of attempts: 1 Airway Equipment and Method: Stylet Placement Confirmation: ETT inserted through vocal cords under direct vision, positive ETCO2 and breath sounds checked- equal and bilateral Secured at: 21 cm Tube secured with: Tape Dental Injury: Teeth and Oropharynx as per pre-operative assessment

## 2022-06-25 NOTE — Anesthesia Postprocedure Evaluation (Signed)
Anesthesia Post Note  Patient: Lee Reilly  Procedure(s) Performed: EVACUATION HEMATOMA, CONTROL OF BLEEDING, HIP WOUND (Left)     Patient location during evaluation: PACU Anesthesia Type: General Level of consciousness: awake and alert, patient cooperative and oriented Pain management: pain level controlled Vital Signs Assessment: post-procedure vital signs reviewed and stable Respiratory status: spontaneous breathing, nonlabored ventilation and respiratory function stable Cardiovascular status: blood pressure returned to baseline and stable Postop Assessment: no apparent nausea or vomiting Anesthetic complications: no   No notable events documented.  Last Vitals:  Vitals:   06/25/22 0600 06/25/22 0623  BP: (!) 101/59 101/71  Pulse: 78 80  Resp: (!) 23   Temp: (!) 36.2 C   SpO2: 99% 100%    Last Pain:  Vitals:   06/25/22 0600  TempSrc:   PainSc: 0-No pain                 Rocio Roam,E. Rayfield Beem

## 2022-06-25 NOTE — ED Triage Notes (Addendum)
Pt arrived via [facility] with c/c of wound care. Per EMS pt had a wound debridement on Friday. Pt is a paraplegic, pt was moving self to wheel chair when it burst. EMS stated about 1L of blood on floor.   180HR, 90/50 700NSS
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
## 2022-06-25 NOTE — TOC Initial Note (Signed)
Transition of Care [facility]) - Initial/Assessment Note    Patient Details  Name: Lee Reilly MRN: [id number] Date of Birth: [birth year]  Transition of Care [facility]) CM/SW Contact:    Lockie Pares, RN Phone Number: 06/25/2022, 3:22 PM  Clinical Narrative:                  37 yo male wheelchair bound with wound. Lives with mother, communicated with ortho office, they are setting up HHPT services.  Patient has DME wheelchair and shower stool at home.    Expected Discharge Plan: Home w Home Health Services Barriers to Discharge: Continued Medical Work up   Patient Goals and CMS Choice            Expected Discharge Plan and Services   Discharge Planning Services: CM Consult   Living arrangements for the past 2 months: Single Family Home                                      Prior Living Arrangements/Services Living arrangements for the past 2 months: Single Family Home Lives with:: Parents Patient language and need for interpreter reviewed:: Yes        Need for Family Participation in Patient Care: Yes (Comment) Care giver support system in place?: Yes (comment) Current home services: DME (wheelchair shower chair) Criminal Activity/Legal Involvement Pertinent to Current Situation/Hospitalization: No - Comment as needed  Activities of Daily Living      Permission Sought/Granted                  Emotional Assessment       Orientation: : Oriented to Self, Oriented to Place Alcohol / Substance Use: Not Applicable Psych Involvement: No (comment)  Admission diagnosis:  Blood loss anemia [D50.0] Bleeding from left hip wound, initial encounter [S71.002A] Status post surgery [Z98.890] Hematoma of left hip [S70.02XA] Patient Active Problem List   Diagnosis Date Noted   Status post surgery 06/25/2022   Hematoma of left hip 06/25/2022   Osteomyelitis of left hip 05/10/2022   Skin ulcer of left lower leg with fat layer exposed 05/04/2022   Ulcer of  left lower leg 05/04/2022   Ankle wound, left, subsequent encounter 06/27/2021   Medication monitoring encounter 06/27/2021   Abscess of right thigh    Septic arthritis of right foot    Subacute osteomyelitis, left ankle and foot    Leukocytosis    Pressure injury of skin 05/30/2021   Hypokalemia 05/29/2021   Microcytic anemia 05/29/2021   Protein-calorie malnutrition, severe 05/29/2021   PCP NOTES >>>>>>>>>>>>>>>>>>>>>>>>>>>> 06/13/2015   Annual physical exam 05/01/2013   Acne 05/01/2013   Substance abuse 09/05/2011   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   Paraplegia 04/08/2007   Asthma 04/08/2007   Neurogenic bladder 04/08/2007   HIDRADENITIS SUPPURATIVA 04/08/2007   PCP:  Jordan Hawks, PA-C Pharmacy:   [id number] [place], [place] - [address] AT [facility] [address] [place] [address] Phone: [phone] Fax: [phone]  [facility] [address] [place] [address] Phone: [phone] Fax: [phone]     Social Determinants of Health (SDOH) Social History: SDOH Screenings   Food Insecurity: No Food Insecurity (05/15/2022)  Housing: Low Risk  (05/15/2022)  Transportation Needs: No Transportation Needs (05/11/2022)  Utilities: Not At Risk (05/15/2022)  Depression (PHQ2-9): Low Risk  (05/29/2022)  Tobacco Use: Medium Risk (06/25/2022)   SDOH Interventions:     Readmission Risk Interventions    05/18/2022    2:25 PM  Readmission Risk Prevention Plan  Transportation Screening Complete  PCP or Specialist Appt within 3-5 Days Complete  HRI or Home Care Consult Complete  Social Work Consult for Recovery Care Planning/Counseling Complete  Palliative Care Screening Not Applicable  Medication Review Oceanographer) Complete

## 2022-06-25 NOTE — Transfer of Care (Signed)
Immediate Anesthesia Transfer of Care Note  Patient: Lee Reilly  Procedure(s) Performed: EVACUATION HEMATOMA, CONTROL OF BLEEDING, HIP WOUND (Left)  Patient Location: PACU  Anesthesia Type:General  Level of Consciousness: alert  and drowsy  Airway & Oxygen Therapy: Patient Spontanous Breathing  Post-op Assessment: Report given to RN and Post -op Vital signs reviewed and stable  Post vital signs: Reviewed and stable  Last Vitals:  Vitals Value Taken Time  BP 107/67 06/25/22 0524  Temp 36.2 C 06/25/22 0524  Pulse 86 06/25/22 0527  Resp 17 06/25/22 0527  SpO2 98 % 06/25/22 0527  Vitals shown include unvalidated device data.  Last Pain:  Vitals:   06/25/22 0524  TempSrc:   PainSc: 0-No pain         Complications: No notable events documented.

## 2022-06-25 NOTE — Progress Notes (Addendum)
Initial Nutrition Assessment  DOCUMENTATION CODES:   Not applicable  INTERVENTION:  Continue regular diet Double protein Carnation Breakfast Essentials TID, each packet provides 5 grams of protein and 130 calories + milk Magic cup TID with meals, each supplement provides 290 kcal and 9 grams of protein 1 packet Juven BID, each packet provides 95 calories, 2.5 grams of protein (collagen), and 9.8 grams of carbohydrate (3 grams sugar); also contains 7 grams of L-arginine and L-glutamine, 300 mg vitamin C, 15 mg vitamin E, 1.2 mcg vitamin B-12, 9.5 mg zinc, 200 mg calcium, and 1.5 g  Calcium Beta-hydroxy-Beta-methylbutyrate to support wound healing MVI with minerals daily Left wound healing nutrition handout with pt  NUTRITION DIAGNOSIS:   Increased nutrient needs related to wound healing as evidenced by estimated needs.  GOAL:   Patient will meet greater than or equal to 90% of their needs  MONITOR:   PO intake, Supplement acceptance, Skin, Labs  REASON FOR ASSESSMENT:   Consult Assessment of nutrition requirement/status, Wound healing (Albumin 1.5  wound healing problems. Vegitarian, but does eats fish . Needs ensure etc)  ASSESSMENT:   Pt with hx of paraplegia due to T4 tumor from neuroblastoma as a child presented to ED with significant bleeding from his left hip incision after recently undergoing left Girdlestone procedure with wound vac placement due to osteomyelitis.    4/15 - Op, Exploration left hip wound and evacuation of hematoma with tissue rearrangement and re-closure. Wound vac placement   Pt resting in bed at the time of assessment. Pt reports that he was eating well prior to admission. States that he is feeling a little nauseated currently but that he plans to order food this afternoon. Reports stable weight ~120 lbs.  Pt has been followed by nutrition team during previous admissions and has indicated he does not like ensure but has accepted juven in the past. Pt  reports that ensure hurts his stomach and continues to decline. Pt states that he does like the juven and actually ordered some for home use. Pt is open to trying magic cup and carnation instant breakfast. Also left education at bedside to increase kcal and protein in his diet while his wound is healing.    Nutritionally Relevant Medications: Scheduled Meds:  docusate sodium  100 mg Oral BID   Continuous Infusions:   ceFAZolin (ANCEF) IV 1 g (06/25/22 0812)   PRN Meds: metoCLOPramide, ondansetron, polyethylene glycol, sodium phosphate  Labs Reviewed: Creatinine <0.2  NUTRITION - FOCUSED PHYSICAL EXAM: Significant muscle wasting noted but related to paraplegia rather than malnutrition. Well nourished around face and stable weight.   Diet Order:   Diet Order             Diet regular Room service appropriate? Yes; Fluid consistency: Thin  Diet effective now                   EDUCATION NEEDS:   Education needs have been addressed  Skin:  Skin Assessment: Reviewed RN Assessment Dehisced hip wound, wound vac in place  Last BM:  unsure  Height:   Ht Readings from Last 1 Encounters:  06/25/22 5\' 3"  (1.6 m)    Weight:   Wt Readings from Last 1 Encounters:  06/25/22 54.4 kg    Ideal Body Weight:  53.6 kg (adjusted by 5% for paraplegia)  BMI:  Body mass index is 21.26 kg/m.  Estimated Nutritional Needs:  Kcal:  1500-1800 kcal/d Protein:  70-95g/d Fluid:  >/=1.8L/d  Ranell Patrick, RD, LDN Clinical Dietitian RD pager # available in [place]  After hours/weekend pager # available in [facility]

## 2022-06-25 NOTE — ED Notes (Signed)
Report given to Rushie Goltz, RN of PACU

## 2022-06-25 NOTE — Op Note (Signed)
Pre and postop diagnosis: Left hip Girdlestone procedure with active postoperative bleeding.  Procedure: Exploration left hip wound and evacuation of hematoma with tissue rearrangement and reclosure.  Surgeon: Ophelia Charter MD  Anesthesia General orotracheal  EBL 100 cc.  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 Lajoyce Corners with [facility] presented with active bleeding that started within the last 12 hours.  Mother called patient had blood in the bed hemoglobin dropped from 11.83 days ago to 7.5 with ongoing active bleeding and ER MD partially took dressing off had active bleeding and could not stop it requiring pressure wrapping with ongoing bleeding through the dressing.  He was taken back to the operating room for exploration for active bleeding and reclosure.  Procedure: After induction general anesthesia orotracheal ovation Ancef prophylaxis patient placed in the lateral position.  Split sheets drapes were used with the potential that more extreme exposure might be needed in case there was active arterial bleeding from femoral artery or profundus.  Patient had a Girdlestone procedure and on second procedure had further bone shortening of the femur and some debridement from the acetabulum.  Patient had retention sutures somewhere near far far near some simple sutures and patient had a T6 shaped incision with the posterior portion extending 4 cm.  All sutures removed after timeout procedure.  Clot was suctioned out cerebellar and we Zada Girt tractors were placed and continued suction.  There was some pieces of graft material down inside the wound which was removed.  No purulence was noted and I did obtain deep cultures.  Continued suctioning removing all blood clots active irrigation was performed and then careful inspection there is no active bleeding noted.  Sharp excisional  debridement of any questionable tissue was performed with pickups 10 blade scalpel and scissors.  Wound was then rearranged and closed with combination of far near near far sutures proximal and distal and then in the midportion using a pediatric red rubber catheters retention sutures were placed with a #2 nylon sutures reapproximating the wound.  A Prevena dressing VAC was applied with good seal.  Patient was transferred to care room in stable condition.

## 2022-06-26 ENCOUNTER — Other Ambulatory Visit: Payer: Self-pay | Admitting: Orthopedic Surgery

## 2022-06-26 ENCOUNTER — Encounter (HOSPITAL_COMMUNITY): Payer: Self-pay | Admitting: Orthopaedic Surgery

## 2022-06-26 DIAGNOSIS — M869 Osteomyelitis, unspecified: Secondary | ICD-10-CM

## 2022-06-26 LAB — BASIC METABOLIC PANEL
Anion gap: 6 (ref 5–15)
Anion gap: 8 (ref 5–15)
BUN: 6 mg/dL (ref 6–20)
BUN: 6 mg/dL (ref 6–20)
CO2: 22 mmol/L (ref 22–32)
CO2: 19 mmol/L — ABNORMAL LOW (ref 22–32)
Calcium: 7.4 mg/dL — ABNORMAL LOW (ref 8.9–10.3)
Calcium: 7.2 mg/dL — ABNORMAL LOW (ref 8.9–10.3)
Chloride: 110 mmol/L (ref 98–111)
Chloride: 108 mmol/L (ref 98–111)
Creatinine, Ser: 0.42 mg/dL — ABNORMAL LOW (ref 0.61–1.24)
Creatinine, Ser: 0.39 mg/dL — ABNORMAL LOW (ref 0.61–1.24)
GFR, Estimated: >60 mL/min (ref >60)
GFR, Estimated: >60 mL/min (ref >60)
Glucose, Bld: 95 mg/dL (ref 70–99)
Glucose, Bld: 109 mg/dL — ABNORMAL HIGH (ref 70–99)
Potassium: 3.1 mmol/L — ABNORMAL LOW (ref 3.5–5.1)
Potassium: 4 mmol/L (ref 3.5–5.1)
Sodium: 138 mmol/L (ref 135–145)
Sodium: 135 mmol/L (ref 135–145)

## 2022-06-26 LAB — CBC
HCT: 28.3 % — ABNORMAL LOW (ref 39.0–52.0)
Hemoglobin: 9.8 g/dL — ABNORMAL LOW (ref 13.0–17.0)
MCH: 29 pg (ref 26.0–34.0)
MCHC: 34.6 g/dL (ref 30.0–36.0)
MCV: 83.7 fL (ref 80.0–100.0)
Platelets: 291 10*3/uL (ref 150–400)
RBC: 3.38 MIL/uL — ABNORMAL LOW (ref 4.22–5.81)
RDW: 15.5 % (ref 11.5–15.5)
WBC: 18.6 10*3/uL — ABNORMAL HIGH (ref 4.0–10.5)
nRBC: 0 % (ref 0.0–0.2)

## 2022-06-26 LAB — TYPE AND SCREEN
ABO/RH(D): B NEG
Unit division: 0
Unit division: 0

## 2022-06-26 LAB — CULTURE, BLOOD (ROUTINE X 2): Culture: NO GROWTH

## 2022-06-26 LAB — BPAM RBC
Blood Product Expiration Date: 202404182359
Blood Product Expiration Date: 202404262359
Blood Product Expiration Date: 202405032359
ISSUE DATE / TIME: 202404150228
ISSUE DATE / TIME: 202404150228
ISSUE DATE / TIME: 202404150321
Unit Type and Rh: 7300
Unit Type and Rh: 7300

## 2022-06-26 LAB — AEROBIC CULTURE W GRAM STAIN (SUPERFICIAL SPECIMEN): Culture: NO GROWTH

## 2022-06-26 NOTE — Telephone Encounter (Signed)
Contacted rep with [facility], waiting to hear back on the status of referral.

## 2022-06-26 NOTE — Telephone Encounter (Signed)
I called and sw pt's mother and advised that a referral has been sent to [facility] home health and we are awaiting to see if they have the nursing staff to accept this referral. The pt would not be able to have a HHN until after the wound vac comes off as he will be d/c with a disposable vac and that there will be nothing for nursing to do or evaluate until after it is removed. The pt's mother also asked about a mattress and what we would recommend? Advised that I would speak to Dr. Lajoyce Corners about a gel over lay for a regular mattress and see if this would work and call her back.

## 2022-06-26 NOTE — Telephone Encounter (Signed)
I called after speaking with Dr. Lajoyce Corners and he said she could try a memory foam topper for his regular mattress and see if this would be more comfortable. She will look on [facility] for this and call with questions.

## 2022-06-26 NOTE — Progress Notes (Signed)
Patient ID: Lee Reilly, male   DOB: 07/06/1985, 37 y.o.   MRN: [id number] Patient is status post repeat debridement left hip wound.  There is 325 cc in the wound VAC canister up from 250 yesterday.  This is clear serosanguineous fluid no signs of active bleeding.  Patient and his mother have requested evaluation for discharge to skilled nursing.

## 2022-06-26 NOTE — Telephone Encounter (Signed)
Faxed referral to [facility].

## 2022-06-26 NOTE — Progress Notes (Signed)
OT EVALUATION  Patient is s/p L hip evacuation surgery resulting in functional limitations due to the deficits listed below (see OT problem list). Pt at baseline works from home, drives with hand controls and lives alone. Pt currently with limited mobility due to wounds and needs for bed level care with wound vac. Pt is a T4 paraplegic that is wearing diapers as currently peri care. Pt with skin integrity concerns and incontinence is adding to the progressive nature of the skin wounds. The patient could greatly benefit from a bowel and bladder program. Recommend a consult to a neuro expert that could help educated patient and staff on the importance of daily cath to avoid incontinence of bladder and digital stimulation for bowel movements to minimize the exposure of the skin to the irritant. Pt will require (A) for bowel/ bladder program at max to total (A) level.  Patient will benefit from skilled OT acutely to increase independence and safety with ADLS to allow discharge skilled inpatient follow up therapy, <3 hours/day.   Strongly recommend HOYER lift only OOB at this time when allowed due to high risk for skin tears and further skin integrity concerns. 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  Precautions  Precautions Fall  Precaution Comments L hip wound vac, wounds on sacrum with edema and weeping wounds, baseline w/c  bound with T4 injury  Restrictions  Weight Bearing Restrictions Yes  RLE Weight Bearing NWB  LLE Weight Bearing NWB  Home Living  Family/patient expects to be discharged to: Private residence  Living Arrangements Alone  Available Help at Discharge Family;Available 24 hours/day  Type of Home House  Home Access Level entry  Home Layout One level  Bathroom Shower/Tub Tub/shower unit;Curtain  Horticulturist, commercial Yes  How Accessible Accessible via wheelchair  Home Equipment Tub bench;Grab bars - tub/shower;Wheelchair - manual  Additional Comments lives alone, uses w/c, works from home, lives indep but reports that mother can help if needed  Prior Function  Prior Level of Function  Independent/Modified Independent;Driving;Working/employed  Mobility Comments mod I at manual w/c level  ADLs Comments Uses briefs for BM's and urine. Does all his own ADL's - bathing, dressing, drives, works for a [employer]. Pt with large wound on L hip. last admission pt requesting air mattress at home and now reports concerns with using mattress due to decreased mobility on the surface.  Pt also needs a more appropriate mattress for his bed at home as pt only sleeps on his L side due to scoliosis and this is where the wound is.Pt reports pending new w/c and discussed possible need to communicate new concerns with x3 surgeries  Communication  Communication No difficulties  Pain Assessment  Pain Assessment Faces  Faces Pain Scale 0  Pain Location pt has decreased sensation so does not report pain at wound. pt does reports some discomfort with R side lying on scapula but resolves with repositioning  Pain Intervention(s) Monitored during session;Repositioned;Limited activity within patient's  tolerance  Cognition  Arousal/Alertness Awake/alert  Behavior During Therapy WFL for tasks assessed/performed  Overall Cognitive Status Within Functional Limits for tasks assessed   Upper Extremity Assessment  Upper Extremity Assessment RUE deficits/detail;LUE deficits/detail  RUE Deficits / Details winging into abduction with muscle atrophie noted  LUE Deficits / Details winging into abduction with muscle atrophie noted  Lower Extremity Assessment  Lower Extremity Assessment Defer to PT evaluation  Cervical / Trunk Assessment  Cervical / Trunk Assessment Other exceptions  Vision- History  Baseline Vision/History 0 No visual deficits  Patient Visual Report No change from baseline  ADL  Overall ADL's  Needs assistance/impaired  General ADL Comments Pt required (A) for incontinence and lack of awareness. pt due to rolling with void of bladder noted with pressure of repositioning.  Bed Mobility  Overal bed mobility Needs Assistance  Bed Mobility Rolling  Rolling Mod assist;+2 for physical assistance  General bed mobility comments pt required modA to achieve full sidelying posture with HOB flat. worked on position with pillows to offweight R shoulder when laying on R side due prevent pain as pt was experiencing before. Pt educated on not placing knees up in bed and leaning forward as that puts more pressure on the L hip wound. pt rolled L/R for hygiene due to having a soiled depends and wound vac leaking in addition to soiled sheets  Transfers  General transfer comment pt with bed rest order. at this time recommending Hoyer lift if/when pt able to get out of bed to prevent further sheering injury with lateral scoot transfers in/out of w/c  General Comments  General comments (skin integrity, edema, etc.) pt with wound vac to L hip and noted edema and several open wounds around penis, groin, peri area and buttocks. Pt also with open wound on L elbow. RN called into the room to address the wound vac due to lack of seal and loud sound from wound vac with drainage noted. Pt with edema noted on L hip area.  OT - End of Session  Activity Tolerance Patient tolerated treatment well   Patient left in bed;with call bell/phone within reach;with bed alarm set  Nurse Communication Mobility status;Precautions;Need for lift equipment  OT Assessment  OT Recommendation/Assessment Patient needs continued OT Services  OT Visit Diagnosis Unsteadiness on feet (R26.81);Muscle weakness (generalized) (M62.81)  OT Problem List Decreased range of motion;Impaired balance (sitting and/or standing);Decreased knowledge of precautions;Decreased safety awareness;Decreased knowledge of use of DME or AE;Impaired sensation;Increased edema  OT Plan  OT Frequency (ACUTE ONLY) Min 2X/week  OT Treatment/Interventions (ACUTE ONLY) Self-care/ADL training;Therapeutic exercise;Energy conservation;DME and/or AE instruction;Manual therapy;Modalities;Therapeutic activities;Patient/family education;Balance training  AM-PAC OT "6 Clicks" Daily Activity Outcome Measure (Version 2)  Help from another person eating meals? 3  Help from another person taking care of personal grooming? 3  Help from another person toileting, which includes using toliet, bedpan, or urinal? 1  Help from another person bathing (including washing, rinsing, drying)? 1  Help from another person to put on and taking off regular upper body clothing? 1  Help from another person to put on and taking off regular lower body clothing? 1  6 Click Score 10  Progressive Mobility  What is the highest level of mobility based on the progressive mobility assessment? Level 1 (Bedfast) - Unable to balance while sitting on edge of bed  Mobility Referral No  Activity Turned to right side;Turned to left side  OT Recommendation  Recommendations for Other Services  (consult for bowel /  bladder education from MD with spinal cord expertise)  Follow Up Recommendations Skilled nursing-short term rehab (<3 hours/day)  Assistance recommended at discharge Set up Supervision/Assistance  Patient can return home with the following Two people to help with  bathing/dressing/bathroom  Functional Status Assessent Patient has had a recent decline in their functional status and demonstrates the ability to make significant improvements in function in a reasonable and predictable amount of time.  OT Equipment Other (comment) ([employer] pending a new w/c and needs to reconsult on this patient due to the new changes in skin and needs. pt likely to need a tilt in space based chair for pressure relief)  Individuals Consulted  Consulted and Agree with Results and Recommendations Patient  Acute Rehab OT Goals  Patient Stated Goal to do what the doctors say  OT Goal Formulation With patient  Time For Goal Achievement 07/10/22  Potential to Achieve Goals Good  OT Time Calculation  OT Start Time (ACUTE ONLY) 1043  OT Stop Time (ACUTE ONLY) 1125  OT Time Calculation (min) 42 min  OT General Charges  $OT Visit 1 Visit  OT Evaluation  $OT Eval Moderate Complexity 1 Mod  OT Treatments  $Self Care/Home Management  8-22 mins  Written Expression  Dominant Hand Right   Brynn, OTR/L  Acute Rehabilitation Services Office: [phone] .

## 2022-06-26 NOTE — Telephone Encounter (Signed)
[facility] does not have the nurse staff to take referral. I will try [facility]

## 2022-06-26 NOTE — Progress Notes (Addendum)
Wound vac dressing found peeled off on one end by PT/OT therapist. Vac therapy interrupted when pt off side. This RN notified and dressing change performed. Left lateral foot wound also cleaned and dressed. Pt tolerated both with no problem.

## 2022-06-26 NOTE — Evaluation (Signed)
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 Pt with L hip wound with large wound vac in addition to poor skin integrity in peri area with other open wounds on buttocks. Pt unsafe and unable to care for self properly at home and would benefit from inpatient rehab <3 hours a day to aide in wound closure/healing, mobility with hoyer lift vs lateral scoot which causes sheering injury and prevent L hip/sacral wound healing. Pt also unable to carry out his typical bowel and bladder program as he couldn't manage alone in which he started using depends which also caused further skin break down due to patient's lack of sensation due to T4 paraplegia. Pt was indep from w/c level and driving with modified car and hand controls. Acute PT to cont to follow.       Recommendations for follow up therapy are one component of a multi-disciplinary discharge planning process, led by the attending physician.  Recommendations may be updated based on patient status, additional functional criteria and insurance authorization.  Follow Up Recommendations Can patient physically be transported by private vehicle: No     Assistance Recommended at Discharge Frequent or constant Supervision/Assistance  Patient can return home with the following  A lot of help with walking and/or transfers;A lot of help with bathing/dressing/bathroom;Assist for transportation;Help with stairs or ramp for entrance;Assistance with cooking/housework    Equipment Recommendations  (TBD at next venue)  Recommendations for Other Services       Functional Status Assessment Patient has had a recent decline in  their functional status and demonstrates the ability to make significant improvements in function in a reasonable and predictable amount of time.     Precautions / Restrictions Precautions Precautions: Fall Precaution Comments: L hip wound vac, wounds on sacrum with edema and weeping wounds, baseline w/c bound with T4 injury Restrictions Weight Bearing Restrictions: Yes RLE Weight Bearing: Non weight bearing LLE Weight Bearing: Non weight bearing      Mobility  Bed Mobility Overal bed mobility: Needs Assistance Bed Mobility: Rolling Rolling: Mod assist, +2 for physical assistance         General bed mobility comments: pt required modA to achieve full sidelying posture with HOB flat. worked on position with pillows to offweight R shoulder when laying on R side due prevent pain as pt was experiencing before. Pt educated on not placing knees up in bed and leaning forward as that puts more pressure on the L hip wound. pt rolled L/R for hygiene due to having a soiled depends and wound vac leaking in addition to soiled sheets    Transfers                   General transfer comment: pt with bed rest order. at this time recommending Hoyer lift if/when pt able to get out of bed to prevent further sheering injury with lateral scoot transfers in/out of w/c    Ambulation/Gait               General Gait Details: non-ambulatory  Stairs            Wheelchair Mobility    Modified Rankin (Stroke Patients Only)  Balance                                             Pertinent Vitals/Pain Pain Assessment Pain Assessment: Faces Faces Pain Scale: No hurt Pain Location: pt with no sensation T4 below so does not report pain at wound and can't not sense bowel and bladder. pt does reports some discomfort with R side lying on scapula but resolves with repositioning    Home Living Family/patient expects to be discharged to:: Private residence Living  Arrangements: Alone Available Help at Discharge: Family;Available 24 hours/day (mother visits t/o the day) Type of Home: House Home Access: Level entry       Home Layout: One level Home Equipment: Tub bench;Grab bars - tub/shower;Wheelchair - manual Additional Comments: lives alone, uses w/c, works from home, lives indep but reports that mother can help if needed    Prior Function Prior Level of Function : Independent/Modified Independent;Driving;Working/employed             Mobility Comments: mod I at manual w/c level ADLs Comments: Uses briefs for BM's and urine. Does all his own ADL's - bathing, dressing, drives, works for a [employer]. Pt with large wound on L hip. last admission pt requesting air mattress at home and now reports concerns with using mattress due to decreased mobility on the surface.  Pt also needs a more appropriate mattress for his bed at home as pt only sleeps on his L side due to scoliosis and this is where the wound is.Pt reports pending new w/c and discussed possible need to communicate new concerns with x3 surgeries     Hand Dominance   Dominant Hand: Right    Extremity/Trunk Assessment   Upper Extremity Assessment Upper Extremity Assessment: Defer to OT evaluation RUE Deficits / Details: winging into abduction with muscle atrophie noted LUE Deficits / Details: winging into abduction with muscle atrophie noted    Lower Extremity Assessment Lower Extremity Assessment:  (pt with paraplegia, no active movement)    Cervical / Trunk Assessment Cervical / Trunk Assessment: Other exceptions Cervical / Trunk Exceptions: 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  Communication   Communication: No difficulties  Cognition Arousal/Alertness: Awake/alert Behavior During Therapy: WFL for tasks assessed/performed Overall Cognitive Status: Within Functional Limits for tasks assessed                                  General Comments: pt with awareness of need of bowel and bladder program and reports having had one but recently started using briefs and was unaware of the damage it was doing to his skin        General Comments General comments (skin integrity, edema, etc.): pt with wound vac to L hip and noted edema and several open wounds around penis, groin, peri area and buttocks. Pt also with open wound on L elbow    Exercises     Assessment/Plan    PT Assessment Patient needs continued PT services  PT Problem List Decreased strength;Decreased activity tolerance;Decreased balance;Decreased mobility;Decreased skin integrity       PT Treatment Interventions Functional mobility training;Therapeutic activities;Therapeutic exercise;Balance training;Patient/family education;Wheelchair mobility training    PT Goals (Current goals can be found in the Care Plan section)  Acute Rehab PT Goals Patient Stated Goal:  get better PT Goal Formulation: With patient Time For Goal Achievement: 07/10/22 Potential to Achieve Goals: Good Additional Goals Additional Goal #1: Pt to be independent with advocating for R sidelying with supportive pillows to off weight L hip for optimal healing.    Frequency Min 2X/week     Co-evaluation PT/OT/SLP Co-Evaluation/Treatment: Yes Reason for Co-Treatment: For patient/therapist safety;To address functional/ADL transfers;Complexity of the patient's impairments (multi-system involvement) PT goals addressed during session: Mobility/safety with mobility OT goals addressed during session: ADL's and self-care;Proper use of Adaptive equipment and DME;Strengthening/ROM       AM-PAC PT "6 Clicks" Mobility  Outcome Measure Help needed turning from your back to your side while in a flat bed without using bedrails?: A Lot Help needed moving from lying on your back to sitting on the side of a flat bed without using bedrails?: A Lot Help needed moving to and from a bed to a  chair (including a wheelchair)?: Total Help needed standing up from a chair using your arms (e.g., wheelchair or bedside chair)?: Total Help needed to walk in hospital room?: Total Help needed climbing 3-5 steps with a railing? : Total 6 Click Score: 8    End of Session   Activity Tolerance: Patient tolerated treatment well Patient left: in bed;with call bell/phone within reach;with nursing/sitter in room Nurse Communication: Mobility status (wound vac no suctioned) PT Visit Diagnosis: Other (comment) (paraplegia with wounds)    Time: 1610-9604 PT Time Calculation (min) (ACUTE ONLY): 40 min   Charges:   PT Evaluation $PT Eval Moderate Complexity: 1 Mod          Lewis Shock, PT, DPT Acute Rehabilitation Services Secure chat preferred Office #: [phone]   Iona Hansen 06/26/2022, 1:22 PM

## 2022-06-26 NOTE — TOC Initial Note (Signed)
Transition of Care [facility]) - Initial/Assessment Note    Patient Details  Name: Lee Reilly MRN: [id number] Date of Birth: 1985/03/16  Transition of Care [facility]) CM/SW Contact:    Lorri Frederick, LCSW Phone Number: 06/26/2022, 3:06 PM  Clinical Narrative:      CSW met with pt regarding DC recommendation for SNF.  Pt does want to pursue SNF, permission given to send out referral in hub.  Pt lives alone but is independent, works/drives.  Permission given to speak with mother Sharlett Iles.    Referral sent out in hub for SNF.            Expected Discharge Plan: Skilled Nursing Facility Barriers to Discharge: Continued Medical Work up, SNF Pending bed offer   Patient Goals and CMS Choice Patient states their goals for this hospitalization and ongoing recovery are:: be able to move without risk of skin injury          Expected Discharge Plan and Services In-house Referral: Clinical Social Work Discharge Planning Services: CM Consult Post Acute Care Choice: Skilled Nursing Facility Living arrangements for the past 2 months: Single Family Home                                      Prior Living Arrangements/Services Living arrangements for the past 2 months: Single Family Home Lives with:: Self Patient language and need for interpreter reviewed:: Yes Do you feel safe going back to the place where you live?: Yes      Need for Family Participation in Patient Care: Yes (Comment) Care giver support system in place?: Yes (comment) Current home services: DME (wheelchair shower chair) Criminal Activity/Legal Involvement Pertinent to Current Situation/Hospitalization: No - Comment as needed  Activities of Daily Living      Permission Sought/Granted Permission sought to share information with : Family Supports Permission granted to share information with : Yes, Verbal Permission Granted  Share Information with NAME: mother and step father  Permission granted to share info w  AGENCY: SNF        Emotional Assessment Appearance:: Appears stated age Attitude/Demeanor/Rapport: Engaged Affect (typically observed): Appropriate, Pleasant Orientation: : Oriented to Self, Oriented to Place, Oriented to  Time, Oriented to Situation Alcohol / Substance Use: Not Applicable Psych Involvement: No (comment)  Admission diagnosis:  Blood loss anemia [D50.0] Bleeding from left hip wound, initial encounter [S71.002A] Status post surgery [Z98.890] Hematoma of left hip [S70.02XA] Patient Active Problem List   Diagnosis Date Noted   Status post surgery 06/25/2022   Hematoma of left hip 06/25/2022   Osteomyelitis of left hip 05/10/2022   Skin ulcer of left lower leg with fat layer exposed 05/04/2022   Ulcer of left lower leg 05/04/2022   Ankle wound, left, subsequent encounter 06/27/2021   Medication monitoring encounter 06/27/2021   Abscess of right thigh    Septic arthritis of right foot    Subacute osteomyelitis, left ankle and foot    Leukocytosis    Pressure injury of skin 05/30/2021   Hypokalemia 05/29/2021   Microcytic anemia 05/29/2021   Protein-calorie malnutrition, severe 05/29/2021   PCP NOTES >>>>>>>>>>>>>>>>>>>>>>>>>>>> 06/13/2015   Annual physical exam 05/01/2013   Acne 05/01/2013   Substance abuse 09/05/2011   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   Paraplegia 04/08/2007   Asthma 04/08/2007   Neurogenic bladder 04/08/2007   HIDRADENITIS SUPPURATIVA 04/08/2007   PCP:  Jordan Hawks, PA-C Pharmacy:   [id number] [place], [place] - [address] AT [facility] OF [address] & [address] [place] [address] Phone: [phone] Fax: [phone]  [facility] [address] [place] [address] Phone: [phone] Fax: [phone]     Social Determinants of Health (SDOH) Social History: SDOH Screenings   Food Insecurity: No Food Insecurity (05/15/2022)  Housing: Low Risk  (05/15/2022)   Transportation Needs: No Transportation Needs (05/11/2022)  Utilities: Not At Risk (05/15/2022)  Depression (PHQ2-9): Low Risk  (05/29/2022)  Tobacco Use: Medium Risk (06/26/2022)   SDOH Interventions:     Readmission Risk Interventions    05/18/2022    2:25 PM  Readmission Risk Prevention Plan  Transportation Screening Complete  PCP or Specialist Appt within 3-5 Days Complete  HRI or Home Care Consult Complete  Social Work Consult for Recovery Care Planning/Counseling Complete  Palliative Care Screening Not Applicable  Medication Review Oceanographer) Complete

## 2022-06-26 NOTE — [place] FL2 (Signed)
[place] MEDICAID FL2 LEVEL OF CARE FORM     IDENTIFICATION  Patient Name: Maximiano Lott Birthdate: [birth year] Sex: male Admission Date (Current Location): 06/25/2022  [place] and [place] Number:  Haynes Bast [id number] Facility and Address:  [facility]. [facility], [address] [place], [place] [address]      Provider Number: [id number]  Attending Physician Name and Address:  Eldred Manges, MD  Relative Name and Phone Number:  Domenic, Schoenberger Mother [phone]    Current Level of Care: Hospital Recommended Level of Care: Skilled Nursing Facility Prior Approval Number:    Date Approved/Denied:   PASRR Number: [id number]  Discharge Plan: SNF    Current Diagnoses: Patient Active Problem List   Diagnosis Date Noted   Status post surgery 06/25/2022   Hematoma of left hip 06/25/2022   Osteomyelitis of left hip 05/10/2022   Skin ulcer of left lower leg with fat layer exposed 05/04/2022   Ulcer of left lower leg 05/04/2022   Ankle wound, left, subsequent encounter 06/27/2021   Medication monitoring encounter 06/27/2021   Abscess of right thigh    Septic arthritis of right foot    Subacute osteomyelitis, left ankle and foot    Leukocytosis    Pressure injury of skin 05/30/2021   Hypokalemia 05/29/2021   Microcytic anemia 05/29/2021   Protein-calorie malnutrition, severe 05/29/2021   PCP NOTES >>>>>>>>>>>>>>>>>>>>>>>>>>>> 06/13/2015   Annual physical exam 05/01/2013   Acne 05/01/2013   Substance abuse 09/05/2011   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  Wound vac in place)                       Personal Care Assistance Level of Assistance  Bathing, Feeding, Dressing Bathing Assistance: Limited assistance Feeding assistance: Independent Dressing Assistance: Limited assistance     Functional Limitations Info  Sight, Hearing, Speech Sight Info: Adequate Hearing Info: Adequate Speech Info: Adequate    SPECIAL CARE FACTORS FREQUENCY  PT (By licensed PT), OT (By licensed OT)     PT Frequency: 5x week OT Frequency: 5x week            Contractures Contractures Info: Not present    Additional Factors Info  Code Status, Allergies Code Status Info: full Allergies Info: NKA           Current Medications (06/26/2022):  This is the current hospital active medication list Current Facility-Administered Medications  Medication Dose Route Frequency Provider Last Rate Last Admin   0.9 %  sodium chloride infusion (Manually program via Guardrails IV Fluids)   Intravenous Once Cardama, Amadeo Garnet, MD   Held at 06/25/22 0320   0.9 %  sodium chloride infusion (Manually program via Guardrails IV Fluids)   Intravenous Once Cardama, Amadeo Garnet, MD   Held at 06/25/22 0320   0.9 %  sodium chloride infusion (Manually program via Guardrails IV Fluids)   Intravenous Once Eldred Manges, MD       acetaminophen (  TYLENOL) tablet 325-650 mg  325-650 mg Oral Q6H PRN Eldred Manges, MD       docusate sodium (COLACE) capsule 100 mg  100 mg Oral BID Eldred Manges, MD       HYDROcodone-acetaminophen (NORCO/VICODIN) 5-325 MG per tablet 1-2 tablet  1-2 tablet Oral Q4H PRN Eldred Manges, MD       metoCLOPramide (REGLAN) tablet 5-10 mg  5-10 mg Oral Q8H PRN Eldred Manges, MD       Or   metoCLOPramide (REGLAN) injection 5-10 mg  5-10 mg Intravenous Q8H PRN Eldred Manges, MD       multivitamin with minerals  tablet 1 tablet  1 tablet Oral Daily Eldred Manges, MD       nutrition supplement (JUVEN) (JUVEN) powder packet 1 packet  1 packet Per Tube BID BM Eldred Manges, MD       ondansetron [facility]) tablet 4 mg  4 mg Oral Q6H PRN Eldred Manges, MD       Or   ondansetron [facility]) injection 4 mg  4 mg Intravenous Q6H PRN Eldred Manges, MD       polyethylene glycol (MIRALAX / GLYCOLAX) packet 17 g  17 g Oral Daily PRN Eldred Manges, MD       sodium phosphate (FLEET) 7-19 GM/118ML enema 1 enema  1 enema Rectal Once PRN Eldred Manges, MD         Discharge Medications: Please see discharge summary for a list of discharge medications.  Relevant Imaging Results:  Relevant Lab Results:   Additional Information SS#: [id number]  Lorri Frederick, LCSW

## 2022-06-27 ENCOUNTER — Telehealth: Payer: Self-pay | Admitting: Orthopedic Surgery

## 2022-06-27 DIAGNOSIS — Z9889 Other specified postprocedural states: Secondary | ICD-10-CM | POA: Diagnosis not present

## 2022-06-27 DIAGNOSIS — D62 Acute posthemorrhagic anemia: Secondary | ICD-10-CM | POA: Insufficient documentation

## 2022-06-27 LAB — CBC
HCT: 29.4 % — ABNORMAL LOW (ref 39.0–52.0)
Hemoglobin: 10.2 g/dL — ABNORMAL LOW (ref 13.0–17.0)
MCH: 29.1 pg (ref 26.0–34.0)
MCHC: 34.7 g/dL (ref 30.0–36.0)
MCV: 84 fL (ref 80.0–100.0)
Platelets: 342 10*3/uL (ref 150–400)
RBC: 3.5 MIL/uL — ABNORMAL LOW (ref 4.22–5.81)
RDW: 15.7 % — ABNORMAL HIGH (ref 11.5–15.5)
WBC: 16.7 10*3/uL — ABNORMAL HIGH (ref 4.0–10.5)
nRBC: 0 % (ref 0.0–0.2)

## 2022-06-27 LAB — AEROBIC CULTURE W GRAM STAIN (SUPERFICIAL SPECIMEN)

## 2022-06-27 LAB — AEROBIC/ANAEROBIC CULTURE W GRAM STAIN (SURGICAL/DEEP WOUND)

## 2022-06-27 MED ORDER — ZINC OXIDE 40 % EX OINT: TOPICAL_OINTMENT | Freq: Every day | CUTANEOUS | Status: DC | PRN

## 2022-06-27 NOTE — Consult Note (Addendum)
WOC Nurse Consult Note: patient known to [facility] team from prior admissions; followed by Dr. Lajoyce Corners and [facility]  Reason for Consult:  sacral wound, L ankle wound  Wound type: 1.  Full thickness draining wounds surrounding L hip wound  2.  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  Wound Center uses Hydrofera blue 3 times a week.  Pressure Injury POA: Yes Measurement: 1.  Multiple draining full thickness areas to L posterior thigh approximately 1 cm x 1 cm x 0.2 cm, L buttock 2 cms x 2 cms x  0.2 cms  2.  L ankle 3 cms x 2 cms x 0.1 cm 90% pink and moist 10% yellow fibrin 3.  L heel Deep Tissue Injury 2.5 cms x 3 cms  Wound bed: pink and moist, L heel DTI skin intact  Drainage (amount, consistency, odor) moderate amount of purulent  drainage from L posterior thigh/buttock, minimal serosanguinous to L ankle, heel dry  Periwound: Prevena in place L hip postop 06/25/2022 Dressing procedure/placement/frequency:  Clean draining areas  around L hip wound with NS, apply Silver Hydrofiber [place] [phone]) daily and cover with ABD pads to help manage exudate.   L ankle clean with NS, apply Silver Hydrofiber cut to fit wound bed every other day, cover with silicone foam.  May lift foam to replace Silver.  MAY NEED TO SOAK SILVER WITH NS IF STUCK TO WOUND BED.  Change foam dressing q3 days and prn soiling.  L heel paint with Betadine 2 times daily and leave open to air.   Patient would benefit from [facility] boots [place] [phone]) to offload pressure.   Low air loss mattress has been ordered for patient for pressure redistribution and moisture management.    POC discussed with patient and bedside nurse.  WOC team will not follow at this time.  Re-consult if further needs arise.   Thank you,    Priscella Mann MSN, RN-BC, [employer] [phone]

## 2022-06-27 NOTE — Telephone Encounter (Signed)
Patient's mother called asked if she should purchase a 1 inch or 2 inch topper for patients mattress. The number to contact Mrs. Henson is [phone]

## 2022-06-27 NOTE — Progress Notes (Signed)
Inpatient Rehabilitation Admissions Coordinator   Rehab MD has been notified to consult on patient for bowel and bladder Management per request. Dr Carlis Abbott to see him today.  Ottie Glazier, RN, MSN Rehab Admissions Coordinator [phone] 06/27/2022 10:12 AM

## 2022-06-27 NOTE — Progress Notes (Signed)
OT NOTE  OT returning mother's call regarding patient needs and answering all questions. OT to continue to follow acutely   Timmothy Euler, OTR/L  Acute Rehabilitation Services Office: [phone] .

## 2022-06-27 NOTE — Telephone Encounter (Signed)
Sheri, I have not yet faxed referral to [facility]. I have all the materials together to do so, but wanted to wait and see once I spoke with you. Patient is currently in the hospital.  [facility] and [facility] cannot take patient on for nursing. Can we see if social worker at hospital can help with this referral? Let me know. ( I don't think that Dr. Lajoyce Corners anticipated patient being in there as long as he has so that is why we started referral process in office)
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
## 2022-06-27 NOTE — Telephone Encounter (Signed)
Okay thank you! I will not proceed further with this at this time.

## 2022-06-27 NOTE — TOC Progression Note (Addendum)
Transition of Care [facility]) - Progression Note    Patient Details  Name: Lee Reilly MRN: [id number] Date of Birth: 01-Nov-1985  Transition of Care [facility]) CM/SW Contact  Lorri Frederick, LCSW Phone Number: 06/27/2022, 10:16 AM  Clinical Narrative:   CSW met with pt and mother, who goes by [name], provided bed offers.  They are asking for higher star rated SNF options, said distance does not matter.  Specifically would like response from [facility].  Referral sent out to wider range of SNF, reached out to [facility].   1115: [facility] cannot accept pt insurance.   Expected Discharge Plan: Skilled Nursing Facility Barriers to Discharge: Continued Medical Work up, SNF Pending bed offer  Expected Discharge Plan and Services In-house Referral: Clinical Social Work Discharge Planning Services: CM Consult Post Acute Care Choice: Skilled Nursing Facility Living arrangements for the past 2 months: Single Family Home                                       Social Determinants of Health (SDOH) Interventions SDOH Screenings   Food Insecurity: No Food Insecurity (05/15/2022)  Housing: Low Risk  (05/15/2022)  Transportation Needs: No Transportation Needs (05/11/2022)  Utilities: Not At Risk (05/15/2022)  Depression (PHQ2-9): Low Risk  (05/29/2022)  Tobacco Use: Medium Risk (06/26/2022)    Readmission Risk Interventions    05/18/2022    2:25 PM  Readmission Risk Prevention Plan  Transportation Screening Complete  PCP or Specialist Appt within 3-5 Days Complete  HRI or Home Care Consult Complete  Social Work Consult for Recovery Care Planning/Counseling Complete  Palliative Care Screening Not Applicable  Medication Review Oceanographer) Complete

## 2022-06-28 LAB — CBC
HCT: 31.9 % — ABNORMAL LOW (ref 39.0–52.0)
Hemoglobin: 10.3 g/dL — ABNORMAL LOW (ref 13.0–17.0)
MCH: 28.1 pg (ref 26.0–34.0)
MCHC: 32.3 g/dL (ref 30.0–36.0)
MCV: 87.2 fL (ref 80.0–100.0)
Platelets: 351 10*3/uL (ref 150–400)
RBC: 3.66 MIL/uL — ABNORMAL LOW (ref 4.22–5.81)
RDW: 15.9 % — ABNORMAL HIGH (ref 11.5–15.5)
WBC: 16.7 10*3/uL — ABNORMAL HIGH (ref 4.0–10.5)
nRBC: 0 % (ref 0.0–0.2)

## 2022-06-28 LAB — CULTURE, BLOOD (ROUTINE X 2)

## 2022-06-28 NOTE — Progress Notes (Signed)
Patient ID: Lee Reilly, male   DOB: June 21, 1985, 37 y.o.   MRN: [id number] Patient is status post repeat debridement left hip wound.  There is 425 cc in the wound VAC canister with persistent clear serosanguineous drainage.  Patient states that he is interested in [place] [facility] for discharge and states that he does have both Medicare Medicaid and the [place] healthcare supplemental.  If patient does not go to [place] burn he would like to consider home health nursing.

## 2022-06-28 NOTE — TOC Progression Note (Signed)
Transition of Care [facility]) - Progression Note    Patient Details  Name: Isley Zinni MRN: [id number] Date of Birth: Sep 29, 1985  [facility]) CM/SW Contact  Kermit Balo, RN Phone Number: 06/28/2022, 4:01 PM  Clinical Narrative:    Cm received message that pt and his mother have decided to d/c home with home health services. CM attempted to call mother x2 without answer and voicemail left with no return call.  Home health arranged with [facility]. Information on the AVS.  CM has confirmed with MD that he will d/c with prevena vac so no HH Rn needed for vac changes. He does have other wounds that CM has asked the bedside RN to instruct pts mother in dressing changes.  Plan if for d/c home tomorrow. ToC following.   Expected Discharge Plan: Home w Home Health Services Barriers to Discharge: Continued Medical Work up  Expected Discharge Plan and Services In-house Referral: Clinical Social Work Discharge Planning Services: CM Consult Post Acute Care Choice: Home Health Living arrangements for the past 2 months: Single Family Home                           HH Arranged: PT, OT, RN HH Agency: [facility] Date HH Agency Contacted: 06/28/22   Representative spoke with at [facility] Agency: Morrie Sheldon   Social Determinants of Health (SDOH) Interventions SDOH Screenings   Food Insecurity: No Food Insecurity (05/15/2022)  Housing: Low Risk  (05/15/2022)  Transportation Needs: No Transportation Needs (05/11/2022)  Utilities: Not At Risk (05/15/2022)  Depression (PHQ2-9): Low Risk  (05/29/2022)  Tobacco Use: Medium Risk (06/26/2022)    Readmission Risk Interventions    05/18/2022    2:25 PM  Readmission Risk Prevention Plan  Transportation Screening Complete  PCP or Specialist Appt within 3-5 Days Complete  HRI or Home Care Consult Complete  Social Work Consult for Recovery Care Planning/Counseling Complete  Palliative Care Screening Not Applicable  Medication  Review Oceanographer) Complete

## 2022-06-28 NOTE — TOC Progression Note (Signed)
Transition of Care [facility]) - Progression Note    Patient Details  Name: Lee Reilly MRN: [id number] Date of Birth: 1985-07-03  Transition of Care [facility]) CM/SW Contact  Lorri Frederick, LCSW Phone Number: 06/28/2022, 12:12 PM  Clinical Narrative:   CSW updated pt and mother that there are no additional bed offers this morning.  They are asking if they could private pay for [facility].  Message sent to Whitney/[facility] who reports not beds available currently.  CSW informed them.  They would like to discuss DC home with [facility], do not want to pursue admission to current SNFs that have offered.  MD/RNCM notified.     Expected Discharge Plan: Skilled Nursing Facility Barriers to Discharge: Continued Medical Work up, SNF Pending bed offer  Expected Discharge Plan and Services In-house Referral: Clinical Social Work Discharge Planning Services: CM Consult Post Acute Care Choice: Skilled Nursing Facility Living arrangements for the past 2 months: Single Family Home                                       Social Determinants of Health (SDOH) Interventions SDOH Screenings   Food Insecurity: No Food Insecurity (05/15/2022)  Housing: Low Risk  (05/15/2022)  Transportation Needs: No Transportation Needs (05/11/2022)  Utilities: Not At Risk (05/15/2022)  Depression (PHQ2-9): Low Risk  (05/29/2022)  Tobacco Use: Medium Risk (06/26/2022)    Readmission Risk Interventions    05/18/2022    2:25 PM  Readmission Risk Prevention Plan  Transportation Screening Complete  PCP or Specialist Appt within 3-5 Days Complete  HRI or Home Care Consult Complete  Social Work Consult for Recovery Care Planning/Counseling Complete  Palliative Care Screening Not Applicable  Medication Review Oceanographer) Complete

## 2022-06-29 ENCOUNTER — Telehealth: Payer: Self-pay | Admitting: Orthopedic Surgery

## 2022-06-29 LAB — BPAM RBC
Blood Product Expiration Date: 202404182359
Blood Product Expiration Date: 202404192359
Blood Product Expiration Date: 202404202359
Blood Product Expiration Date: 202404252359
Blood Product Expiration Date: 202404252359
Blood Product Expiration Date: 202404252359
ISSUE DATE / TIME: 202404092002
ISSUE DATE / TIME: 202404150228
ISSUE DATE / TIME: 202404150228
ISSUE DATE / TIME: 202404172107
Unit Type and Rh: 7300
Unit Type and Rh: 7300
Unit Type and Rh: 7300
Unit Type and Rh: 7300
Unit Type and Rh: 7300
Unit Type and Rh: 7300

## 2022-06-29 LAB — TYPE AND SCREEN
Antibody Screen: NEGATIVE
Unit division: 0
Unit division: 0
Unit division: 0
Unit division: 0
Unit division: 0
Unit division: 0
Unit division: 0
Unit division: 0

## 2022-06-29 NOTE — Progress Notes (Signed)
Patient's mother  

## 2022-06-29 NOTE — Telephone Encounter (Signed)
SW pt, let her know that it shouldn't make a difference in either 1 to 2 inches for top overlay.

## 2022-06-29 NOTE — Telephone Encounter (Signed)
SW pt's mom and she wanted me to send you a message. She has just left [facility] and they do not have any opening and they also do not take his insurance. It looks like from readings the notes that if this didn't work out he would be d/c home with home health. FYI

## 2022-06-29 NOTE — Progress Notes (Signed)
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  Patient moved under mostly his own power with some assistance for legs and keeping surface friction to a minimum due to hip wound.  Spoke with pt regarding some of his mother's concerns and OT followed up to speak with her as they now plan to return home with mother to assist and use hoyer lift for transfers and keeping pt off L hip as much as possible.  Engaged to educate on sitting limitations and possible need for 3:1 as well to keep mother from having to get hoyer lift into bathroom.  Patient reports plans to initiate better bowel and bladder program after speaking with MD from rehab.  Communicated with [facility] agency as well to ensure appropriate follow up for progression and to prevent further skin issues.  PT will follow up if not d/c though pt anticipates home tomorrow.    Recommendations for follow up therapy are one component of a multi-disciplinary discharge planning process, led by the attending physician.  Recommendations may be updated based on patient status, additional functional criteria and insurance authorization.  Follow Up Recommendations  Can patient physically be transported by private vehicle: No    Assistance Recommended at Discharge Frequent or constant Supervision/Assistance  Patient can return home with the following A lot of help with walking and/or transfers;A lot of help with bathing/dressing/bathroom;Assist for transportation;Help with stairs or ramp for entrance;Assistance with cooking/housework   Equipment Recommendations  Other  (comment);BSC/3in1 (hoyer lift)    Recommendations for Other Services       Precautions / Restrictions Precautions Precautions: Fall Precaution Comments: L hip wound vac, wounds on sacrum with edema and weeping wounds, baseline w/c bound with T4 injury Required Braces or Orthoses: Other Brace Other Brace: prevlons for mainly L ankle wound Restrictions Weight Bearing Restrictions: Yes RLE Weight Bearing: Non weight bearing LLE Weight Bearing: Non weight bearing     Mobility  Bed Mobility Overal bed mobility: Needs Assistance Bed Mobility: Supine to Sit, Sit to Supine     Supine to sit: Supervision, Min assist Sit to supine: Min assist   General bed mobility comments: able to use UE's to come up to EOB using rail and slightly increased HOB; scooting to EOB with some friction, but limited to EOB only; to supine assist for LE's    Transfers                   General transfer comment: Discussed wtih pt need to use hoyer for OOB transfers to wheelchair and to [facility] and to sit only for 1 hour or less at a time to prevent further skin breakdown; discussed his plan to re-initiate bowel program and in/out caths to limit moisture on skin    Ambulation/Gait                   Stairs             Wheelchair Mobility    Modified Rankin (Stroke Patients Only)       Balance Overall balance assessment: Needs assistance   Sitting balance-Leahy Scale: Fair  Sitting balance - Comments: sitting EOB feet unsupported and hands in lap no LOB                                    Cognition Arousal/Alertness: Awake/alert Behavior During Therapy: WFL for tasks assessed/performed Overall Cognitive Status: Within Functional Limits for tasks assessed                                          Exercises      General Comments General comments (skin integrity, edema, etc.): declined any symptoms of dizziness after sitting few minutes and  noted no issues with skin or increased drainage; reinforced safety with wound vac and that HH therapies will educate his mom in hoyer lift transfers and encouraged movement to EOB and lifting up in chair to maintain arm strength for when he can return to scooting transfers. Communicated with Morrie Sheldon at [facility] to relay information to [facility] therapists for education with mom on hoyer transfers, sitting only one hour at a time and for helping to maintain some strength to allow transfers on his own when skin healed.      Pertinent Vitals/Pain Pain Assessment Faces Pain Scale: No hurt    Home Living                          Prior Function            PT Goals (current goals can now be found in the care plan section) Progress towards PT goals: Progressing toward goals    Frequency    Min 2X/week      PT Plan Current plan remains appropriate    Co-evaluation PT/OT/SLP Co-Evaluation/Treatment: Yes Reason for Co-Treatment: For patient/therapist safety;To address functional/ADL transfers;Complexity of the patient's impairments (multi-system involvement) PT goals addressed during session: Mobility/safety with mobility;Balance OT goals addressed during session: ADL's and self-care;Proper use of Adaptive equipment and DME      AM-PAC PT "6 Clicks" Mobility   Outcome Measure  Help needed turning from your back to your side while in a flat bed without using bedrails?: A Little Help needed moving from lying on your back to sitting on the side of a flat bed without using bedrails?: A Little Help needed moving to and from a bed to a chair (including a wheelchair)?: Total Help needed standing up from a chair using your arms (e.g., wheelchair or bedside chair)?: Total Help needed to walk in hospital room?: Total Help needed climbing 3-5 steps with a railing? : Total 6 Click Score: 10    End of Session   Activity Tolerance: Patient tolerated treatment well Patient left: in  bed;with call bell/phone within reach   PT Visit Diagnosis: Other (comment) (paraplegia with wounds)     Time: 7829-5621 PT Time Calculation (min) (ACUTE ONLY): 33 min  Charges:  $Self Care/Home Management: 8-22                     Sheran Lawless, PT Acute Rehabilitation Services Office:[phone] 06/29/2022    Lee Reilly 06/29/2022, 2:44 PM

## 2022-06-29 NOTE — Progress Notes (Signed)
    Durable Medical Equipment  (From admission, onward)           Start     Ordered   06/29/22 1603  For home use only DME Bedside commode  Once       Question:  Patient needs a bedside commode to treat with the following condition  Answer:  Weakness   06/29/22 1602   06/29/22 1308  For home use only DME Other see comment  Once       Comments: Michiel Sites lift  Question:  Length of Need  Answer:  6 Months   06/29/22 1308            Pt requires a BSC as he is unable to make it to the restroom in a timely manner on the level of the home in which he will be staying.

## 2022-06-29 NOTE — Progress Notes (Signed)
Patient ID: Lee Reilly, male   DOB: Aug 05, 1985, 37 y.o.   MRN: [id number] Patient has 100 cc since yesterday.  The wound VAC does not have as good as seal.  Will plan to change the wound VAC dressing seal tomorrow morning.  Patient's mother is visiting pending burn today to try to resolve discharge depending [place] otherwise we will plan for discharge to home on Saturday.

## 2022-06-29 NOTE — TOC Progression Note (Addendum)
Transition of Care [facility]) - Progression Note    Patient Details  Name: Lee Reilly MRN: [id number] Date of Birth: 11-Sep-1985  Transition of Care [facility]) CM/SW Contact  Kermit Balo, RN Phone Number: 06/29/2022, 3:18 PM  Clinical Narrative:    Therapies with some concerns about pt discharging home. CM was finally able to talk with patients mother today. She is also concerned about d/c home but they dont like any of their SNF rehab choices.  CM has impressed upon her that he does need close to 24 hour care and assistance getting OOB. Hoyer lift ordered for home through [employer]. She states they are getting aides 18 hours a week through [facility] and is willing to private pay for more assistance. CM has provided her resources to find caregivers. She is going to see what she can get set up. She plans on staying with him at night to provide the support he needs.  Once caregivers are arranged pt should be able to dc home.  [facility] also ordered through [employer] to be delivered to the home.  Pt will require ambulance transport.  TOC following.   Expected Discharge Plan: Home w Home Health Services Barriers to Discharge: Continued Medical Work up  Expected Discharge Plan and Services In-house Referral: Clinical Social Work Discharge Planning Services: CM Consult Post Acute Care Choice: Home Health Living arrangements for the past 2 months: Single Family Home                           HH Arranged: PT, OT, RN HH Agency: [facility] Date HH Agency Contacted: 06/28/22   Representative spoke with at [facility] Agency: Morrie Sheldon   Social Determinants of Health (SDOH) Interventions SDOH Screenings   Food Insecurity: No Food Insecurity (05/15/2022)  Housing: Low Risk  (05/15/2022)  Transportation Needs: No Transportation Needs (05/11/2022)  Utilities: Not At Risk (05/15/2022)  Depression (PHQ2-9): Low Risk  (05/29/2022)  Tobacco Use: Medium Risk (06/26/2022)     Readmission Risk Interventions    05/18/2022    2:25 PM  Readmission Risk Prevention Plan  Transportation Screening Complete  PCP or Specialist Appt within 3-5 Days Complete  HRI or Home Care Consult Complete  Social Work Consult for Recovery Care Planning/Counseling Complete  Palliative Care Screening Not Applicable  Medication Review Oceanographer) Complete

## 2022-06-29 NOTE — Progress Notes (Signed)
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 Pt's mother expressed concerns for the first time ever in her ability to provide the adequate care that patient needs to safely leave the hospital. Pt and mother agreeable to home services and education on a hoyer lift for safe transfers. Mother wanted more information regarding wound care management. Pt's mother will be at work teaching 1-5:30 PM and they have hired Chiropodist" for 2.5 hours of help. Pt could greatly benefit from maximized services for home as pt currently does not have a bed offer at skilled community based therapy.    Recommendations for follow up therapy are one component of a multi-disciplinary discharge planning process, led by the attending physician.  Recommendations may be updated based on patient status, additional functional criteria and insurance authorization.    Assistance Recommended at Discharge Intermittent Supervision/Assistance  Patient can return home with the following  A lot of help with bathing/dressing/bathroom;Assist for transportation   Equipment Recommendations  BSC/3in1 (pt needs a BSC bariatric with wide sides to help with transfers and lateral leans for peri care.)    Recommendations for Other Services      Precautions / Restrictions Precautions Precautions: Fall Precaution Comments: L hip wound vac, wounds on sacrum with edema and weeping wounds, baseline w/c bound with T4 injury Required Braces or Orthoses: Other Brace Other Brace: prevlons for mainly L ankle wound Restrictions Weight Bearing Restrictions: Yes RLE Weight Bearing: Non  weight bearing LLE Weight Bearing: Non weight bearing       Mobility Bed Mobility Overal bed mobility: Needs Assistance Bed Mobility: Rolling, Supine to Sit Rolling: Supervision   Supine to sit: Supervision     General bed mobility comments: pt needs cues for wound vac tube placement and making sure not to pull when transfer. pt was able to scoot toward the EOB and static sit mod I    Transfers                         Balance                                           ADL either performed or assessed with clinical judgement   ADL Overall ADL's : Needs assistance/impaired                                       General ADL Comments: Pt was able to recall educational advice regarding bowel/ bladder. pt was able to verbalize strategies that he wanted to use for home. pt enjoys a dark room to help with making him feel more comfortable and does not express any concerns for light sensitivity when asked    Extremity/Trunk Assessment Upper Extremity Assessment Upper Extremity Assessment: Overall WFL for tasks assessed RUE Deficits / Details: reports R shoulder discomfort is better   Lower Extremity Assessment Lower Extremity Assessment: Defer to PT evaluation;LLE deficits/detail LLE Deficits / Details: prior surgery  wound in march 2024        Vision       Perception     Praxis      Cognition Arousal/Alertness: Awake/alert Behavior During Therapy: [facility] for tasks assessed/performed Overall Cognitive Status: Within Functional Limits for tasks assessed                                          Exercises      Shoulder Instructions       General Comments educated on recommendation for hoyer for transfers and what activities include hoyer such as to the wheelchair for no more than 1 hour, to the bedside commode to void or bath.    Pertinent Vitals/ Pain       Pain Assessment Pain Assessment: No/denies  pain  Home Living                                          Prior Functioning/Environment              Frequency  Min 2X/week        Progress Toward Goals  OT Goals(current goals can now be found in the care plan section)  Progress towards OT goals: Progressing toward goals  Acute Rehab OT Goals Patient Stated Goal: to be able to get additional help at home to be able to go home OT Goal Formulation: With patient Time For Goal Achievement: 07/10/22 Potential to Achieve Goals: Good  Plan Discharge plan remains appropriate    Co-evaluation    PT/OT/SLP Co-Evaluation/Treatment: Yes Reason for Co-Treatment: For patient/therapist safety;To address functional/ADL transfers;Complexity of the patient's impairments (multi-system involvement) PT goals addressed during session: Mobility/safety with mobility;Balance OT goals addressed during session: ADL's and self-care;Proper use of Adaptive equipment and DME      AM-PAC OT "6 Clicks" Daily Activity     Outcome Measure   Help from another person eating meals?: A Little Help from another person taking care of personal grooming?: A Little Help from another person toileting, which includes using toliet, bedpan, or urinal?: A Lot Help from another person bathing (including washing, rinsing, drying)?: A Lot Help from another person to put on and taking off regular upper body clothing?: A Lot Help from another person to put on and taking off regular lower body clothing?: A Lot 6 Click Score: 14    End of Session    OT Visit Diagnosis: Unsteadiness on feet (R26.81);Muscle weakness (generalized) (M62.81)   Activity Tolerance Patient tolerated treatment well   Patient Left in bed;with call bell/phone within reach   Nurse Communication Mobility status;Precautions        Time: 6962-9528 OT Time Calculation (min): 26 min  Charges: OT General Charges $OT Visit: 1 Visit OT Treatments $Self Care/Home  Management : 8-22 mins   Brynn, OTR/L  Acute Rehabilitation Services Office: [phone] .   Mateo Flow 06/29/2022, 2:36 PM

## 2022-06-30 LAB — CULTURE, BLOOD (ROUTINE X 2): Culture: NO GROWTH

## 2022-06-30 NOTE — Discharge Summary (Signed)
Discharge Diagnoses:  Principal Problem:   Status post surgery Active Problems:   Hematoma of left hip   Acute blood loss anemia   Surgeries: Procedure(s): EVACUATION HEMATOMA, CONTROL OF BLEEDING, HIP WOUND on 06/25/2022    Consultants: Treatment Team:  Nadara Mustard, MD  Discharged Condition: Improved  Hospital Course: Lee Reilly is an 37 y.o. male who was admitted 06/25/2022 with a chief complaint of left hip wound, with a final diagnosis of BLEEDING LEFT HIP WOUND.  Patient was brought to the operating room on 06/25/2022 and underwent Procedure(s): EVACUATION HEMATOMA, CONTROL OF BLEEDING, HIP WOUND.    Patient was given perioperative antibiotics:  Anti-infectives (From admission, onward)    Start     Dose/Rate Route Frequency Ordered Stop   06/25/22 0715  ceFAZolin (ANCEF) IVPB 1 g/50 mL premix        1 g 100 mL/hr over 30 Minutes Intravenous Every 6 hours 06/25/22 0619 06/25/22 1916     .  Patient was given sequential compression devices, early ambulation, and aspirin for DVT prophylaxis.  Recent vital signs: Patient Vitals for the past 24 hrs:  BP Temp Temp src Pulse Resp SpO2  06/30/22 0527 103/61 98.6 F (37 C) Oral 95 19 100 %  06/29/22 2017 (!) 109/56 97.7 F (36.5 C) Oral (!) 125 18 100 %  06/29/22 1406 102/64 98 F (36.7 C) -- 80 18 99 %  .  Recent laboratory studies: No results found.  Discharge Medications:   Allergies as of 06/30/2022   No Known Allergies      Medication List     TAKE these medications    ciprofloxacin 500 MG tablet Commonly known as: CIPRO Take 500 mg by mouth 2 (two) times daily.   docusate sodium 100 MG capsule Commonly known as: COLACE Take 1 capsule (100 mg total) by mouth daily.   metroNIDAZOLE 500 MG tablet Commonly known as: FLAGYL Take 500 mg by mouth 2 (two) times daily.   oxyCODONE-acetaminophen 5-325 MG tablet Commonly known as: PERCOCET/ROXICET Take 1 tablet by mouth every 4 (four) hours as needed.    silver sulfADIAZINE 1 % cream Commonly known as: SILVADENE Apply 1 Application topically daily. Apply to affected area daily plus dry dressing What changed:  when to take this reasons to take this additional instructions               Durable Medical Equipment  (From admission, onward)           Start     Ordered   06/29/22 1603  For home use only DME Bedside commode  Once       Question:  Patient needs a bedside commode to treat with the following condition  Answer:  Weakness   06/29/22 1602   06/29/22 1308  For home use only DME Other see comment  Once       Comments: Michiel Sites lift  Question:  Length of Need  Answer:  6 Months   06/29/22 1308            Diagnostic Studies: DG Chest Port 1 View  Result Date: 06/25/2022 CLINICAL DATA:  Tachycardia EXAM: PORTABLE CHEST 1 VIEW COMPARISON:  05/29/2021 FINDINGS: Stable appearance of the chest. Haziness of the left hemithorax, favoring overlying soft tissue rather than a pleural effusion as noted on the prior report. Right paramediastinal mass, unchanged. Right lung is otherwise clear. The heart is normal in size. Thoracolumbar spine fixation hardware. IMPRESSION: Right paramediastinal mass, unchanged from 2023.  No prior CT is available for comparison. If this is not a known finding, CT chest with contrast is suggested for further evaluation. No acute cardiopulmonary disease. Electronically Signed   By: Charline Bills M.D.   On: 06/25/2022 01:46    Patient benefited maximally from their hospital stay and there were no complications.     Disposition:  There are no questions and answers to display.         Follow-up Information     [facility] Follow up.   Why: The home health agency will contact you for the first home visit. Contact information: [phone]        Nadara Mustard, MD Follow up in 1 week(s).   Specialty: Orthopedic Surgery Contact information: [address] [place]  [address] [phone]                  Signed: Nadara Mustard 06/30/2022, 8:43 AM

## 2022-06-30 NOTE — Plan of Care (Signed)

## 2022-06-30 NOTE — Progress Notes (Signed)
Patient discharged, discharge instructions given and explained by [facility] nurse. Patient and mother understood, no additional questions and or concerns at this time. Patient mother wanted primary physician to be within [facility], case management notified and information given to family. Patient and mother given wound care instructions with supplies. Patient and mother states understanding. Patient IV removed by SWOT nurse. Patient safely discharged to wheelchair and transported to vehicle with family.

## 2022-07-01 DIAGNOSIS — J45909 Unspecified asthma, uncomplicated: Secondary | ICD-10-CM | POA: Diagnosis not present

## 2022-07-01 DIAGNOSIS — S7002XD Contusion of left hip, subsequent encounter: Secondary | ICD-10-CM | POA: Diagnosis not present

## 2022-07-01 DIAGNOSIS — T8131XD Disruption of external operation (surgical) wound, not elsewhere classified, subsequent encounter: Secondary | ICD-10-CM | POA: Diagnosis not present

## 2022-07-01 DIAGNOSIS — C7490 Malignant neoplasm of unspecified part of unspecified adrenal gland: Secondary | ICD-10-CM | POA: Diagnosis not present

## 2022-07-01 DIAGNOSIS — D62 Acute posthemorrhagic anemia: Secondary | ICD-10-CM | POA: Diagnosis not present

## 2022-07-01 DIAGNOSIS — L709 Acne, unspecified: Secondary | ICD-10-CM | POA: Diagnosis not present

## 2022-07-01 DIAGNOSIS — Z556 Problems related to health literacy: Secondary | ICD-10-CM | POA: Diagnosis not present

## 2022-07-01 DIAGNOSIS — G8220 Paraplegia, unspecified: Secondary | ICD-10-CM | POA: Diagnosis not present

## 2022-07-01 DIAGNOSIS — M96840 Postprocedural hematoma of a musculoskeletal structure following a musculoskeletal system procedure: Secondary | ICD-10-CM | POA: Diagnosis not present

## 2022-07-02 ENCOUNTER — Ambulatory Visit (INDEPENDENT_AMBULATORY_CARE_PROVIDER_SITE_OTHER): Payer: No Typology Code available for payment source | Admitting: Orthopedic Surgery

## 2022-07-02 ENCOUNTER — Telehealth: Payer: Self-pay

## 2022-07-02 ENCOUNTER — Telehealth: Payer: Self-pay | Admitting: Orthopedic Surgery

## 2022-07-02 ENCOUNTER — Encounter: Payer: No Typology Code available for payment source | Admitting: Orthopedic Surgery

## 2022-07-02 ENCOUNTER — Encounter: Payer: Self-pay | Admitting: Orthopedic Surgery

## 2022-07-02 DIAGNOSIS — S7002XA Contusion of left hip, initial encounter: Secondary | ICD-10-CM | POA: Diagnosis not present

## 2022-07-02 DIAGNOSIS — M623 Immobility syndrome (paraplegic): Secondary | ICD-10-CM | POA: Diagnosis not present

## 2022-07-02 DIAGNOSIS — D62 Acute posthemorrhagic anemia: Secondary | ICD-10-CM | POA: Diagnosis not present

## 2022-07-02 DIAGNOSIS — L97929 Non-pressure chronic ulcer of unspecified part of left lower leg with unspecified severity: Secondary | ICD-10-CM | POA: Diagnosis not present

## 2022-07-02 DIAGNOSIS — M869 Osteomyelitis, unspecified: Secondary | ICD-10-CM | POA: Diagnosis not present

## 2022-07-02 DIAGNOSIS — T8131XA Disruption of external operation (surgical) wound, not elsewhere classified, initial encounter: Secondary | ICD-10-CM

## 2022-07-02 DIAGNOSIS — G8220 Paraplegia, unspecified: Secondary | ICD-10-CM | POA: Diagnosis not present

## 2022-07-02 DIAGNOSIS — R3981 Functional urinary incontinence: Secondary | ICD-10-CM | POA: Diagnosis not present

## 2022-07-02 DIAGNOSIS — L97922 Non-pressure chronic ulcer of unspecified part of left lower leg with fat layer exposed: Secondary | ICD-10-CM | POA: Diagnosis not present

## 2022-07-02 NOTE — Telephone Encounter (Signed)
Pt is scheduled for surgery on Wednesday. Per Dr. Lajoyce Corners pt needs to be in skilled nursing after discharge from hospital.

## 2022-07-02 NOTE — Telephone Encounter (Signed)
Patient's mother Lee Reilly called again and asked if Dr. Lajoyce Corners would send an order for wound care to [facility] to come out to the home for wound care 3 times a week instead of once a week. Lee Reilly said the wound vac has stopped working and asked if her son can come in today to see Dr. Lajoyce Corners?  Lee Reilly said the bandage is coming off as well. The number to contact Lee Reilly is [phone]

## 2022-07-02 NOTE — Telephone Encounter (Signed)
Holding this message until pending appointment to come in today to eval the dressing and see what Dr. Lajoyce Corners will advise further in regards of wound vac and if a new one needs to be placed.

## 2022-07-02 NOTE — Telephone Encounter (Signed)
Can you please call pt and make an appt for this afternoon with Dr. Lajoyce Corners. Wound vac malfunction needs appt. Thanks!

## 2022-07-02 NOTE — Telephone Encounter (Signed)
Duplicate message. Message sent ot triage to make an appt for this afternoon for eval.

## 2022-07-02 NOTE — Telephone Encounter (Signed)
Patient's mother called in. Patient had surgery with Dr.Duda on the 12th, and then with Dr.Yates on the 15th. Patient's wound vac is not working and the dressings are coming off. Please advise on what to do please. Call back #[phone]
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
## 2022-07-03 ENCOUNTER — Encounter (HOSPITAL_COMMUNITY): Payer: Self-pay | Admitting: Orthopedic Surgery

## 2022-07-03 ENCOUNTER — Other Ambulatory Visit: Payer: Self-pay

## 2022-07-03 NOTE — Progress Notes (Signed)
I called Mr. Lee Reilly preferred number, it was patient's mother's number, she is a designated party release.  Mr. Lee Reilly lives alone, he wants [place] to speak with his mother. Mrs. Lee Reilly reports that Lee Reilly has not complained of chest pain or shortness of breath. Mrs. Grider states that Lee Reilly denies having any s/s of Covid in his household, also denies any known exposure to Covid. Mrs Lee Reilly states that Lee Reilly has not had any  any s/s of upper or lower respiratory in the past 8 weeks.   Mr. Lee Reilly PCP is Ralene Ok, PA-C.

## 2022-07-04 ENCOUNTER — Inpatient Hospital Stay (HOSPITAL_COMMUNITY)
Admission: AD | Admit: 2022-07-04 | Discharge: 2022-07-09 | DRG: 902 | Disposition: A | Discharge status: Home / Self Care | Payer: No Typology Code available for payment source | Attending: Orthopedic Surgery | Admitting: Orthopedic Surgery

## 2022-07-04 ENCOUNTER — Ambulatory Visit (HOSPITAL_COMMUNITY): Payer: No Typology Code available for payment source | Admitting: Anesthesiology

## 2022-07-04 ENCOUNTER — Other Ambulatory Visit: Payer: Self-pay

## 2022-07-04 ENCOUNTER — Encounter (HOSPITAL_COMMUNITY)
Admission: AD | Disposition: A | Discharge status: Home / Self Care | Payer: Self-pay | Source: Home / Self Care | Attending: Orthopedic Surgery

## 2022-07-04 ENCOUNTER — Encounter (HOSPITAL_COMMUNITY): Payer: Self-pay | Admitting: Orthopedic Surgery

## 2022-07-04 DIAGNOSIS — D649 Anemia, unspecified: Secondary | ICD-10-CM

## 2022-07-04 DIAGNOSIS — N319 Neuromuscular dysfunction of bladder, unspecified: Secondary | ICD-10-CM | POA: Diagnosis not present

## 2022-07-04 DIAGNOSIS — Z87891 Personal history of nicotine dependence: Secondary | ICD-10-CM

## 2022-07-04 DIAGNOSIS — J45909 Unspecified asthma, uncomplicated: Secondary | ICD-10-CM | POA: Diagnosis not present

## 2022-07-04 DIAGNOSIS — Z981 Arthrodesis status: Secondary | ICD-10-CM | POA: Diagnosis not present

## 2022-07-04 DIAGNOSIS — Y838 Other surgical procedures as the cause of abnormal reaction of the patient, or of later complication, without mention of misadventure at the time of the procedure: Secondary | ICD-10-CM | POA: Diagnosis present

## 2022-07-04 DIAGNOSIS — S71002D Unspecified open wound, left hip, subsequent encounter: Secondary | ICD-10-CM | POA: Diagnosis not present

## 2022-07-04 DIAGNOSIS — T8189XA Other complications of procedures, not elsewhere classified, initial encounter: Secondary | ICD-10-CM | POA: Diagnosis not present

## 2022-07-04 DIAGNOSIS — T8131XA Disruption of external operation (surgical) wound, not elsewhere classified, initial encounter: Secondary | ICD-10-CM | POA: Diagnosis not present

## 2022-07-04 DIAGNOSIS — G8220 Paraplegia, unspecified: Secondary | ICD-10-CM | POA: Diagnosis not present

## 2022-07-04 HISTORY — PX: APPLICATION OF WOUND VAC: SHX5189

## 2022-07-04 HISTORY — PX: I & D EXTREMITY: SHX5045

## 2022-07-04 HISTORY — DX: Unspecified urinary incontinence: R32

## 2022-07-04 HISTORY — DX: Personal history of other medical treatment: Z92.89

## 2022-07-04 HISTORY — DX: Full incontinence of feces: R15.9

## 2022-07-04 LAB — CBC
HCT: 35 % — ABNORMAL LOW (ref 39.0–52.0)
Hemoglobin: 11.3 g/dL — ABNORMAL LOW (ref 13.0–17.0)
MCH: 28.2 pg (ref 26.0–34.0)
MCHC: 32.3 g/dL (ref 30.0–36.0)
MCV: 87.3 fL (ref 80.0–100.0)
Platelets: 434 10*3/uL — ABNORMAL HIGH (ref 150–400)
RBC: 4.01 MIL/uL — ABNORMAL LOW (ref 4.22–5.81)
RDW: 15.2 % (ref 11.5–15.5)
WBC: 16.4 10*3/uL — ABNORMAL HIGH (ref 4.0–10.5)
nRBC: 0 % (ref 0.0–0.2)

## 2022-07-04 LAB — AEROBIC/ANAEROBIC CULTURE W GRAM STAIN (SURGICAL/DEEP WOUND)

## 2022-07-04 SURGERY — IRRIGATION AND DEBRIDEMENT EXTREMITY
Anesthesia: General | Site: Hip | Laterality: Left

## 2022-07-04 MED ORDER — ALUM & MAG HYDROXIDE-SIMETH 200-200-20 MG/5ML PO SUSP: 15.0000 mL | ORAL | Status: DC | PRN

## 2022-07-04 MED ORDER — GUAIFENESIN-DM 100-10 MG/5ML PO SYRP: 15.0000 mL | ORAL_SOLUTION | ORAL | Status: DC | PRN

## 2022-07-04 MED ORDER — HYDROCODONE-ACETAMINOPHEN 5-325 MG PO TABS: 1.0000 | ORAL_TABLET | ORAL | Status: DC | PRN

## 2022-07-04 MED ORDER — MORPHINE SULFATE (PF) 2 MG/ML IV SOLN: 0.5000 mg | INTRAVENOUS | Status: DC | PRN

## 2022-07-04 MED ORDER — HYDRALAZINE HCL 20 MG/ML IJ SOLN: 5.0000 mg | INTRAMUSCULAR | Status: DC | PRN

## 2022-07-04 MED ORDER — MAGNESIUM SULFATE 2 GM/50ML IV SOLN: 2.0000 g | Freq: Every day | INTRAVENOUS | Status: DC | PRN

## 2022-07-04 MED ORDER — HYDROMORPHONE HCL 1 MG/ML IJ SOLN: 0.2500 mg | INTRAMUSCULAR | Status: DC | PRN

## 2022-07-04 MED ORDER — MAGNESIUM CITRATE PO SOLN: 1.0000 | Freq: Once | ORAL | Status: DC | PRN

## 2022-07-04 MED ORDER — DOCUSATE SODIUM 100 MG PO CAPS
100.0000 mg | ORAL_CAPSULE | Freq: Every day | ORAL | Status: DC
  Administered 2022-07-05 – 2022-07-09 (×4): 100 mg via ORAL
  Filled 2022-07-04 (×5): qty 1

## 2022-07-04 MED ORDER — POTASSIUM CHLORIDE CRYS ER 20 MEQ PO TBCR: 20.0000 meq | EXTENDED_RELEASE_TABLET | Freq: Every day | ORAL | Status: DC | PRN

## 2022-07-04 MED ORDER — VITAMIN C 500 MG PO TABS
1000.0000 mg | ORAL_TABLET | Freq: Every day | ORAL | Status: DC
  Administered 2022-07-04 – 2022-07-09 (×5): 1000 mg via ORAL
  Filled 2022-07-04 (×6): qty 2

## 2022-07-04 MED ORDER — PHENYLEPHRINE 80 MCG/ML (10ML) SYRINGE FOR IV PUSH (FOR BLOOD PRESSURE SUPPORT)
PREFILLED_SYRINGE | INTRAVENOUS | Status: DC | PRN
  Administered 2022-07-04: 240 ug via INTRAVENOUS
  Administered 2022-07-04: 160 ug via INTRAVENOUS

## 2022-07-04 MED ORDER — SODIUM CHLORIDE 0.9 % IV SOLN: INTRAVENOUS | Status: DC

## 2022-07-04 MED ORDER — VANCOMYCIN HCL 1000 MG IV SOLR
INTRAVENOUS | Status: AC
  Filled 2022-07-04: qty 20

## 2022-07-04 MED ORDER — ACETAMINOPHEN 325 MG PO TABS: 325.0000 mg | ORAL_TABLET | Freq: Four times a day (QID) | ORAL | Status: DC | PRN

## 2022-07-04 MED ORDER — LABETALOL HCL 5 MG/ML IV SOLN: 10.0000 mg | INTRAVENOUS | Status: DC | PRN

## 2022-07-04 MED ORDER — CEFAZOLIN SODIUM-DEXTROSE 2-4 GM/100ML-% IV SOLN
2.0000 g | INTRAVENOUS | Status: AC
  Administered 2022-07-04: 2 g via INTRAVENOUS

## 2022-07-04 MED ORDER — OXYCODONE HCL 5 MG/5ML PO SOLN: 5.0000 mg | Freq: Once | ORAL | Status: DC | PRN

## 2022-07-04 MED ORDER — SODIUM CHLORIDE 0.9 % IV SOLN
2.0000 g | Freq: Three times a day (TID) | INTRAVENOUS | Status: DC
  Administered 2022-07-04 – 2022-07-08 (×11): 2 g via INTRAVENOUS
  Filled 2022-07-04 (×12): qty 12.5

## 2022-07-04 MED ORDER — FENTANYL CITRATE (PF) 250 MCG/5ML IJ SOLN
INTRAMUSCULAR | Status: AC
  Filled 2022-07-04: qty 5

## 2022-07-04 MED ORDER — OXYCODONE HCL 5 MG PO TABS: 5.0000 mg | ORAL_TABLET | Freq: Once | ORAL | Status: DC | PRN

## 2022-07-04 MED ORDER — FENTANYL CITRATE (PF) 250 MCG/5ML IJ SOLN
INTRAMUSCULAR | Status: DC | PRN
  Administered 2022-07-04: 100 ug via INTRAVENOUS

## 2022-07-04 MED ORDER — ORAL CARE MOUTH RINSE: 15.0000 mL | Freq: Once | OROMUCOSAL | Status: AC

## 2022-07-04 MED ORDER — HYDROCODONE-ACETAMINOPHEN 7.5-325 MG PO TABS: 1.0000 | ORAL_TABLET | ORAL | Status: DC | PRN

## 2022-07-04 MED ORDER — CHLORHEXIDINE GLUCONATE 0.12 % MT SOLN
15.0000 mL | Freq: Once | OROMUCOSAL | Status: AC
  Administered 2022-07-04: 15 mL via OROMUCOSAL
  Filled 2022-07-04: qty 15

## 2022-07-04 MED ORDER — METRONIDAZOLE 500 MG PO TABS
500.0000 mg | ORAL_TABLET | Freq: Three times a day (TID) | ORAL | Status: DC
  Administered 2022-07-04 – 2022-07-09 (×12): 500 mg via ORAL
  Filled 2022-07-04 (×13): qty 1

## 2022-07-04 MED ORDER — AMISULPRIDE (ANTIEMETIC) 5 MG/2ML IV SOLN: 10.0000 mg | Freq: Once | INTRAVENOUS | Status: DC | PRN

## 2022-07-04 MED ORDER — PHENOL 1.4 % MT LIQD: 1.0000 | OROMUCOSAL | Status: DC | PRN

## 2022-07-04 MED ORDER — 0.9 % SODIUM CHLORIDE (POUR BTL) OPTIME
TOPICAL | Status: DC | PRN
  Administered 2022-07-04 (×2): 1000 mL

## 2022-07-04 MED ORDER — SUGAMMADEX SODIUM 200 MG/2ML IV SOLN
INTRAVENOUS | Status: DC | PRN
  Administered 2022-07-04 (×2): 200 mg via INTRAVENOUS

## 2022-07-04 MED ORDER — PROMETHAZINE HCL 25 MG/ML IJ SOLN: 6.2500 mg | INTRAMUSCULAR | Status: DC | PRN

## 2022-07-04 MED ORDER — PANTOPRAZOLE SODIUM 40 MG PO TBEC
40.0000 mg | DELAYED_RELEASE_TABLET | Freq: Every day | ORAL | Status: DC
  Administered 2022-07-04 – 2022-07-09 (×5): 40 mg via ORAL
  Filled 2022-07-04 (×6): qty 1

## 2022-07-04 MED ORDER — PROPOFOL 10 MG/ML IV BOLUS
INTRAVENOUS | Status: DC | PRN
  Administered 2022-07-04: 150 mg via INTRAVENOUS

## 2022-07-04 MED ORDER — MEPERIDINE HCL 25 MG/ML IJ SOLN: 6.2500 mg | INTRAMUSCULAR | Status: DC | PRN

## 2022-07-04 MED ORDER — POLYETHYLENE GLYCOL 3350 17 G PO PACK: 17.0000 g | PACK | Freq: Every day | ORAL | Status: DC | PRN

## 2022-07-04 MED ORDER — VANCOMYCIN HCL 750 MG/150ML IV SOLN
750.0000 mg | Freq: Two times a day (BID) | INTRAVENOUS | Status: DC
  Administered 2022-07-04 – 2022-07-07 (×7): 750 mg via INTRAVENOUS
  Filled 2022-07-04 (×7): qty 150

## 2022-07-04 MED ORDER — LACTATED RINGERS IV SOLN: INTRAVENOUS | Status: DC

## 2022-07-04 MED ORDER — METOPROLOL TARTRATE 5 MG/5ML IV SOLN: 2.0000 mg | INTRAVENOUS | Status: DC | PRN

## 2022-07-04 MED ORDER — EPHEDRINE SULFATE-NACL 50-0.9 MG/10ML-% IV SOSY
PREFILLED_SYRINGE | INTRAVENOUS | Status: DC | PRN
  Administered 2022-07-04: 5 mg via INTRAVENOUS

## 2022-07-04 MED ORDER — ONDANSETRON HCL 4 MG/2ML IJ SOLN: 4.0000 mg | Freq: Four times a day (QID) | INTRAMUSCULAR | Status: DC | PRN

## 2022-07-04 MED ORDER — VANCOMYCIN HCL 1000 MG IV SOLR
INTRAVENOUS | Status: DC | PRN
  Administered 2022-07-04: 1000 mg via TOPICAL

## 2022-07-04 MED ORDER — ROCURONIUM BROMIDE 10 MG/ML (PF) SYRINGE
PREFILLED_SYRINGE | INTRAVENOUS | Status: DC | PRN
  Administered 2022-07-04: 50 mg via INTRAVENOUS

## 2022-07-04 MED ORDER — BISACODYL 5 MG PO TBEC: 5.0000 mg | DELAYED_RELEASE_TABLET | Freq: Every day | ORAL | Status: DC | PRN

## 2022-07-04 MED ORDER — JUVEN PO PACK
1.0000 | PACK | Freq: Two times a day (BID) | ORAL | Status: DC
  Administered 2022-07-05 – 2022-07-09 (×2): 1 via ORAL
  Filled 2022-07-04 (×7): qty 1

## 2022-07-04 MED ORDER — ONDANSETRON HCL 4 MG/2ML IJ SOLN
INTRAMUSCULAR | Status: DC | PRN
  Administered 2022-07-04: 4 mg via INTRAVENOUS

## 2022-07-04 MED ORDER — PHENYLEPHRINE HCL-NACL 20-0.9 MG/250ML-% IV SOLN
INTRAVENOUS | Status: DC | PRN
  Administered 2022-07-04: 50 ug/min via INTRAVENOUS

## 2022-07-04 MED ORDER — LIDOCAINE 2% (20 MG/ML) 5 ML SYRINGE
INTRAMUSCULAR | Status: DC | PRN
  Administered 2022-07-04: 60 mg via INTRAVENOUS

## 2022-07-04 MED ORDER — ZINC SULFATE 220 (50 ZN) MG PO CAPS
220.0000 mg | ORAL_CAPSULE | Freq: Every day | ORAL | Status: DC
  Administered 2022-07-04 – 2022-07-09 (×5): 220 mg via ORAL
  Filled 2022-07-04 (×6): qty 1

## 2022-07-04 SURGICAL SUPPLY — 44 items
BAG COUNTER SPONGE SURGICOUNT (BAG) IMPLANT
BAG SPNG CNTER NS LX DISP (BAG)
BLADE SURG 21 STRL SS (BLADE) ×2 IMPLANT
BNDG CMPR 5X6 CHSV STRCH STRL (GAUZE/BANDAGES/DRESSINGS)
BNDG COHESIVE 6X5 TAN ST LF (GAUZE/BANDAGES/DRESSINGS) IMPLANT
BNDG GAUZE DERMACEA FLUFF 4 (GAUZE/BANDAGES/DRESSINGS) ×4 IMPLANT
BNDG GZE DERMACEA 4 6PLY (GAUZE/BANDAGES/DRESSINGS)
CANISTER WOUNDNEG PRESSURE 500 (CANNISTER) IMPLANT
CNTNR URN SCR LID CUP LEK RST (MISCELLANEOUS) IMPLANT
CONT SPEC 4OZ STRL OR WHT (MISCELLANEOUS) ×1
COVER SURGICAL LIGHT HANDLE (MISCELLANEOUS) ×4 IMPLANT
DRAPE DERMATAC (DRAPES) IMPLANT
DRAPE INCISE IOBAN 66X45 STRL (DRAPES) IMPLANT
DRAPE U-SHAPE 47X51 STRL (DRAPES) ×2 IMPLANT
DRESSING PREVENA PLUS CUSTOM (GAUZE/BANDAGES/DRESSINGS) IMPLANT
DRESSING VERAFLO CLEANS CC MED (GAUZE/BANDAGES/DRESSINGS) IMPLANT
DRSG ADAPTIC 3X8 NADH LF (GAUZE/BANDAGES/DRESSINGS) ×2 IMPLANT
DRSG PREVENA PLUS CUSTOM (GAUZE/BANDAGES/DRESSINGS) ×1
DRSG VERAFLO CLEANSE CC MED (GAUZE/BANDAGES/DRESSINGS)
DURAPREP 26ML APPLICATOR (WOUND CARE) ×2 IMPLANT
ELECT REM PT RETURN 9FT ADLT (ELECTROSURGICAL)
ELECTRODE REM PT RTRN 9FT ADLT (ELECTROSURGICAL) IMPLANT
GAUZE SPONGE 4X4 12PLY STRL (GAUZE/BANDAGES/DRESSINGS) ×2 IMPLANT
GLOVE BIOGEL PI IND STRL 9 (GLOVE) ×2 IMPLANT
GLOVE SURG ORTHO 9.0 STRL STRW (GLOVE) ×2 IMPLANT
GOWN STRL REUS W/ TWL XL LVL3 (GOWN DISPOSABLE) ×4 IMPLANT
GOWN STRL REUS W/TWL XL LVL3 (GOWN DISPOSABLE) ×1
GRAFT SKIN WND MICRO 38 (Tissue) IMPLANT
HANDPIECE INTERPULSE COAX TIP (DISPOSABLE)
KIT BASIN OR (CUSTOM PROCEDURE TRAY) ×2 IMPLANT
KIT TURNOVER KIT B (KITS) ×2 IMPLANT
MANIFOLD NEPTUNE II (INSTRUMENTS) ×2 IMPLANT
NS IRRIG 1000ML POUR BTL (IV SOLUTION) ×2 IMPLANT
PACK ORTHO EXTREMITY (CUSTOM PROCEDURE TRAY) ×2 IMPLANT
PAD ARMBOARD 7.5X6 YLW CONV (MISCELLANEOUS) ×4 IMPLANT
PAD NEG PRESSURE SENSATRAC (MISCELLANEOUS) IMPLANT
SET HNDPC FAN SPRY TIP SCT (DISPOSABLE) IMPLANT
STOCKINETTE IMPERVIOUS 9X36 MD (GAUZE/BANDAGES/DRESSINGS) IMPLANT
SUT ETHILON 2 0 PSLX (SUTURE) ×2 IMPLANT
SWAB COLLECTION DEVICE MRSA (MISCELLANEOUS) ×2 IMPLANT
SWAB CULTURE ESWAB REG 1ML (MISCELLANEOUS) IMPLANT
TOWEL GREEN STERILE (TOWEL DISPOSABLE) ×2 IMPLANT
TUBE CONNECTING 12X1/4 (SUCTIONS) ×2 IMPLANT
YANKAUER SUCT BULB TIP NO VENT (SUCTIONS) ×2 IMPLANT

## 2022-07-04 NOTE — Progress Notes (Signed)
Pharmacy Antibiotic Note  Lee Reilly is a 37 y.o. 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  Pharmacy has been consulted for cefepime / vanco dosing.  Plan: Vancomycin 750 mg IV every 12 hours.  Scr 0.8 (actual is 0.39) eAUC 454 mcg*hr/mL Cefepime 2 grams iv q8h  Height:  (160 cm) Weight: 54.4 kg (120 lb) IBW/kg (Calculated) : 56.9  Temp (24hrs), Avg:97.8 F (36.6 C), Min:97.6 F (36.4 C), Max:97.9 F (36.6 C)  Recent Labs  Lab 06/28/22 0302 07/04/22 1334  WBC 16.7* 16.4*    Estimated Creatinine Clearance: 96.3 mL/min (A) (by C-G formula based on SCr of 0.39 mg/dL (L)).    No Known Allergies  Antimicrobials this admission: Cefepime 4/24 >>  vanco 4/24 >>      Thank you for allowing pharmacy to be a part of this patient's care.  Greta Doom BS, PharmD, BCPS Clinical Pharmacist 07/04/2022 5:32 PM  Contact: [phone] after 3 PM  "Be curious, not judgmental..." -Debbora Dus

## 2022-07-04 NOTE — Anesthesia Procedure Notes (Signed)
Procedure Name: Intubation Date/Time: 07/04/2022 3:16 PM  Performed by: Lelon Perla, CRNAPre-anesthesia Checklist: Patient identified, Emergency Drugs available, Suction available and Patient being monitored Patient Re-evaluated:Patient Re-evaluated prior to induction Oxygen Delivery Method: Circle system utilized Preoxygenation: Pre-oxygenation with 100% oxygen Induction Type: IV induction Ventilation: Mask ventilation without difficulty and Oral airway inserted - appropriate to patient size Laryngoscope Size: Mac and 4 Grade View: Grade I Tube type: Oral Tube size: 7.5 mm Number of attempts: 1 Airway Equipment and Method: Stylet and Oral airway Placement Confirmation: ETT inserted through vocal cords under direct vision, positive ETCO2 and breath sounds checked- equal and bilateral Secured at: 21 cm Tube secured with: Tape Dental Injury: Teeth and Oropharynx as per pre-operative assessment

## 2022-07-04 NOTE — Transfer of Care (Signed)
Immediate Anesthesia Transfer of Care Note  Patient: Lee Reilly  Procedure(s) Performed: LEFT HIP DEBRIDEMENT (Left: Hip)  Patient Location: PACU  Anesthesia Type:General  Level of Consciousness: awake, alert , and oriented  Airway & Oxygen Therapy: Patient Spontanous Breathing  Post-op Assessment: Report given to RN and Post -op Vital signs reviewed and stable  Post vital signs: Reviewed and stable  Last Vitals:  Vitals Value Taken Time  BP 112/63 07/04/22 1615  Temp    Pulse 110 07/04/22 1618  Resp 19 07/04/22 1618  SpO2 99 % 07/04/22 1618  Vitals shown include unvalidated device data.  Last Pain:  Vitals:   07/04/22 1207  TempSrc: Oral         Complications: No notable events documented.
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
## 2022-07-04 NOTE — Interval H&P Note (Signed)
History and Physical Interval Note:  07/04/2022 1:41 PM  Lee Reilly  has presented today for surgery, with the diagnosis of Contaminated Left Hip Wound.  The various methods of treatment have been discussed with the patient and family. After consideration of risks, benefits and other options for treatment, the patient has consented to  Procedure(s): LEFT HIP DEBRIDEMENT (Left) as a surgical intervention.  The patient's history has been reviewed, patient examined, no change in status, stable for surgery.  I have reviewed the patient's chart and labs.  Questions were answered to the patient's satisfaction.     Nadara Mustard

## 2022-07-04 NOTE — Op Note (Signed)
07/04/2022  4:15 PM  PATIENT:  Lee Reilly    PRE-OPERATIVE DIAGNOSIS:  Contaminated Left Hip Wound with stool.  POST-OPERATIVE DIAGNOSIS:  Same  PROCEDURE:  LEFT HIP DEBRIDEMENT excision skin and soft tissue muscle and fascia. Tissue sent for cultures. Wound filled with 38 cm Kerecis micro graft and 1 g vancomycin powder. Application of customizable wound VAC sponge.  SURGEON:  Nadara Mustard, MD  PHYSICIAN ASSISTANT:None ANESTHESIA:   General  PREOPERATIVE INDICATIONS:  [name] is a  37 y.o. male with a diagnosis of Contaminated Left Hip Wound who failed conservative measures and elected for surgical management.    The risks benefits and alternatives were discussed with the patient preoperatively including but not limited to the risks of infection, bleeding, nerve injury, cardiopulmonary complications, the need for revision surgery, among others, and the patient was willing to proceed.  OPERATIVE IMPLANTS:   Implant Name Type Inv. Item Serial No. Manufacturer Lot No. LRB No. Used Action  GRAFT SKIN WND MICRO 38 - [id number] Tissue GRAFT SKIN WND MICRO 38  [employer] [id number] Left 1 Implanted    @  OPERATIVE FINDINGS: There was stool covering the wound.  The deep tissue was sent for cultures.  OPERATIVE PROCEDURE: Patient was brought the operating room underwent a general anesthetic.  After adequate levels anesthesia obtained patient's left hip was prepped using DuraPrep draped into a sterile field a timeout was called.  Elliptical incision was made around the wound.  A Cobb elevator and rondure were used to further resect skin soft tissue muscle and fascia.  The contaminated tissue was sent for cultures.  The wound was irrigated with normal saline.  The wound was then filled with 1 g vancomycin powder and 38 cm a Kerecis micro graft.  The wound was closed using 2-0 nylon.  A customizable wound VAC was applied this had a good suction fit patient was extubated  taken the PACU in stable condition   DISCHARGE PLANNING:  Antibiotic duration: Will need to continue antibiotics based on culture sensitivities.  Weightbearing: Not applicable  Pain medication: Opioid pathway  Dressing care/ Wound VAC: Wound VAC  Ambulatory devices: Not applicable  Discharge to: Patient may require repeat debridement on Friday.  Plan for discharge to skilled nursing.  Follow-up: In the office 1 week post operative.

## 2022-07-04 NOTE — Progress Notes (Signed)
Pt arrived to room 5N06 via bed after surgery. Received report from [place], RN in PACU. See assessment. Will continue to monitor.

## 2022-07-04 NOTE — Anesthesia Postprocedure Evaluation (Signed)
Anesthesia Post Note  Patient: Lee Reilly  Procedure(s) Performed: LEFT HIP DEBRIDEMENT (Left: Hip) APPLICATION OF WOUND VAC (Left: Hip)     Patient location during evaluation: PACU Anesthesia Type: General Level of consciousness: awake and alert Pain management: pain level controlled Vital Signs Assessment: post-procedure vital signs reviewed and stable Respiratory status: spontaneous breathing, nonlabored ventilation and respiratory function stable Cardiovascular status: blood pressure returned to baseline and stable Postop Assessment: no apparent nausea or vomiting Anesthetic complications: no   No notable events documented.  Last Vitals:  Vitals:   07/04/22 1630 07/04/22 1645  BP: 114/64   Pulse: 94 99  Resp: 20 19  Temp:  36.6 C  SpO2: 97% 99%    Last Pain:  Vitals:   07/04/22 1645  TempSrc:   PainSc: 0-No pain                 Lowella Curb

## 2022-07-04 NOTE — Anesthesia Preprocedure Evaluation (Signed)
Anesthesia Evaluation  Patient identified by MRN, date of birth, ID band Patient awake    Reviewed: Allergy & Precautions, NPO status , Patient's Chart, lab work & pertinent test results  History of Anesthesia Complications Negative for: history of anesthetic complications  Airway Mallampati: I  TM Distance: >3 FB Neck ROM: Full    Dental  (+) Dental Advisory Given   Pulmonary asthma , Patient abstained from smoking., former smoker   breath sounds clear to auscultation       Cardiovascular  Rhythm:Regular Rate:Normal  On mitodrine for BP support   Neuro/Psych T4 paraplegia: neuroblastoma ([facility] [place])    GI/Hepatic negative GI ROS,,,(+)     substance abuse  marijuana use  Endo/Other  negative endocrine ROS    Renal/GU negative Renal ROS Bladder dysfunction (neurogenic bladder)      Musculoskeletal  (+) Arthritis , Osteoarthritis,    Abdominal   Peds  Hematology  (+) Blood dyscrasia (Hb 8.5), anemia Now receiving 3rd unit of pRBCs   Anesthesia Other Findings Paraplegia  Reproductive/Obstetrics                             Anesthesia Physical Anesthesia Plan  ASA: 3  Anesthesia Plan: General   Post-op Pain Management: Dilaudid IV   Induction: Intravenous  PONV Risk Score and Plan: 2 and Ondansetron, Midazolam and Treatment may vary due to age or medical condition  Airway Management Planned: LMA and Oral ETT  Additional Equipment: None  Intra-op Plan:   Post-operative Plan: Extubation in OR  Informed Consent: I have reviewed the patients History and Physical, chart, labs and discussed the procedure including the risks, benefits and alternatives for the proposed anesthesia with the patient or authorized representative who has indicated his/her understanding and acceptance.     Dental advisory given  Plan Discussed with: CRNA and Surgeon  Anesthesia Plan Comments:         Anesthesia Quick Evaluation

## 2022-07-05 ENCOUNTER — Encounter (HOSPITAL_COMMUNITY): Payer: Self-pay | Admitting: Orthopedic Surgery

## 2022-07-05 DIAGNOSIS — T8189XA Other complications of procedures, not elsewhere classified, initial encounter: Secondary | ICD-10-CM | POA: Diagnosis not present

## 2022-07-05 LAB — CBC
HCT: 30.1 % — ABNORMAL LOW (ref 39.0–52.0)
Hemoglobin: 9.8 g/dL — ABNORMAL LOW (ref 13.0–17.0)
MCH: 28 pg (ref 26.0–34.0)
MCHC: 32.6 g/dL (ref 30.0–36.0)
MCV: 86 fL (ref 80.0–100.0)
Platelets: 415 10*3/uL — ABNORMAL HIGH (ref 150–400)
RBC: 3.5 MIL/uL — ABNORMAL LOW (ref 4.22–5.81)
RDW: 15.2 % (ref 11.5–15.5)
WBC: 15.7 10*3/uL — ABNORMAL HIGH (ref 4.0–10.5)
nRBC: 0 % (ref 0.0–0.2)

## 2022-07-05 LAB — BASIC METABOLIC PANEL
Anion gap: 8 (ref 5–15)
BUN: 8 mg/dL (ref 6–20)
CO2: 22 mmol/L (ref 22–32)
Calcium: 8 mg/dL — ABNORMAL LOW (ref 8.9–10.3)
Chloride: 109 mmol/L (ref 98–111)
Creatinine, Ser: 0.43 mg/dL — ABNORMAL LOW (ref 0.61–1.24)
GFR, Estimated: >60 mL/min (ref >60)
Glucose, Bld: 92 mg/dL (ref 70–99)
Potassium: 3.6 mmol/L (ref 3.5–5.1)
Sodium: 139 mmol/L (ref 135–145)

## 2022-07-05 MED ORDER — ZINC OXIDE 40 % EX OINT
TOPICAL_OINTMENT | Freq: Two times a day (BID) | CUTANEOUS | Status: DC
  Filled 2022-07-05 (×3): qty 57

## 2022-07-05 NOTE — Progress Notes (Signed)
Prevalon boot ordered.

## 2022-07-05 NOTE — H&P (View-Only) (Signed)
Patient ID: Lee Reilly, male   DOB: 01/08/1986, 36 y.o.   MRN: [id number] Patient is postoperative day 1 debridement left hip contaminated wound.  Cultures are showing gram-positive rods.  The wound VAC has a good suction fit.  Plan to return to the operating room for repeat debridement and discharged to skilled nursing.  Plan for surgery tomorrow.  Discussed with the patient's mother the possibility of a ostomy to divert the colon.  She feels like this may be too risky of a surgery. 

## 2022-07-05 NOTE — Progress Notes (Signed)
PT Cancellation Note  Patient Details Name: Lee Reilly MRN: [id number] DOB: December 11, 1985   Cancelled Treatment:    Reason Eval/Treat Not Completed: PT screened, no needs identified, will sign off  Spoke with patient (and called his mother at his request). Patient currently does not have skilled PT needs to qualify him for a skilled rehab stay. He should only be doing passive transfers (hoyer lift) to reduce risk of friction and delayed wound healing. Both expressed understanding that his primary skilled need right now is for wound care. Spoke with Tammy Sours, LCSW re: no skilled PT needs and ?if pt could qualify for [facility] for complex wound care. He plans to look into pt's discharge options (SNF vs LTACH). No further PT needs at this time.    Jerolyn Center, PT Acute Rehabilitation Services  Office [phone]    Zena Amos 07/05/2022, 1:13 PM

## 2022-07-05 NOTE — Progress Notes (Signed)
Patient ID: Lee Reilly   DOB: May 12, 1985, 37 y.o.   MRN: [id number] Patient is postoperative day 1 debridement left hip contaminated wound.  Cultures are showing gram-positive rods.  The wound VAC has a good suction fit.  Plan to return to the operating room for repeat debridement and discharged to skilled nursing.  Plan for surgery tomorrow.  Discussed with the patient's mother the possibility of a ostomy to divert the colon.  She feels like this may be too risky of a surgery.

## 2022-07-05 NOTE — Consult Note (Signed)
WOC Nurse Consult Note: Reason for Consult:Wounds to ankle and above penis. Patient is known to our service from previous admissions. Seen on 06/27/22 by my associate, H. Bullins. I will continue that POC. Hip wounds to be managed by Dr. Lajoyce Corners post operative procedure. Wound type:Full thickness healing stage 4 PI to left ankle, let heel DTPI, pubic area partial thickness wound Pressure Injury POA: Yes Measurement: Left ankle: Healing Stage 4:  1.8cm x 1.6cm x 0.1cm minimal serosanguinous exudate Pubic area (non pressure, partial thickness) 0.7cm x 0.4cm x 0.2cm, pink, moist, scant exudate Left heel DTPI: 2.5cm x 3cm  purple discoloration, intact, no drainage Wound bed:As noted above Drainage (amount, consistency, odor) As noted above Periwound: intact. Dressing procedure/placement/frequency: I have provided topical care guidance for the care of the above lesions using silver hydrofiber to the left ankle, Prevalon boots for the floatation of the heels and Desitin ointment for the pubic are lesion.  WOC nursing team will not follow, but will remain available to this patient, the nursing and medical teams.  Please re-consult if needed.  Thank you for inviting [place] to participate in this patient's Plan of Care.  Ladona Mow, MSN, RN, CNS, GNP, Leda Min, [employer], [employer] phone:  [phone]

## 2022-07-06 ENCOUNTER — Inpatient Hospital Stay (HOSPITAL_COMMUNITY): Payer: No Typology Code available for payment source | Admitting: Certified Registered"

## 2022-07-06 ENCOUNTER — Encounter (HOSPITAL_COMMUNITY): Payer: Self-pay | Admitting: Orthopedic Surgery

## 2022-07-06 ENCOUNTER — Other Ambulatory Visit: Payer: Self-pay | Admitting: Orthopedic Surgery

## 2022-07-06 ENCOUNTER — Encounter (HOSPITAL_COMMUNITY)
Admission: AD | Disposition: A | Discharge status: Home / Self Care | Payer: Self-pay | Source: Home / Self Care | Attending: Orthopedic Surgery

## 2022-07-06 ENCOUNTER — Other Ambulatory Visit: Payer: Self-pay

## 2022-07-06 DIAGNOSIS — T8189XA Other complications of procedures, not elsewhere classified, initial encounter: Secondary | ICD-10-CM

## 2022-07-06 DIAGNOSIS — T8131XA Disruption of external operation (surgical) wound, not elsewhere classified, initial encounter: Secondary | ICD-10-CM | POA: Diagnosis not present

## 2022-07-06 DIAGNOSIS — M869 Osteomyelitis, unspecified: Secondary | ICD-10-CM

## 2022-07-06 DIAGNOSIS — J45909 Unspecified asthma, uncomplicated: Secondary | ICD-10-CM

## 2022-07-06 DIAGNOSIS — S71002D Unspecified open wound, left hip, subsequent encounter: Secondary | ICD-10-CM

## 2022-07-06 DIAGNOSIS — Z87891 Personal history of nicotine dependence: Secondary | ICD-10-CM

## 2022-07-06 HISTORY — PX: I & D EXTREMITY: SHX5045

## 2022-07-06 LAB — BASIC METABOLIC PANEL
Anion gap: 8 (ref 5–15)
BUN: 6 mg/dL (ref 6–20)
CO2: 21 mmol/L — ABNORMAL LOW (ref 22–32)
Calcium: 8 mg/dL — ABNORMAL LOW (ref 8.9–10.3)
Chloride: 108 mmol/L (ref 98–111)
Creatinine, Ser: 0.37 mg/dL — ABNORMAL LOW (ref 0.61–1.24)
GFR, Estimated: >60 mL/min (ref >60)
Glucose, Bld: 104 mg/dL — ABNORMAL HIGH (ref 70–99)
Potassium: 4.2 mmol/L (ref 3.5–5.1)
Sodium: 137 mmol/L (ref 135–145)

## 2022-07-06 LAB — CBC
HCT: 33.1 % — ABNORMAL LOW (ref 39.0–52.0)
Hemoglobin: 10 g/dL — ABNORMAL LOW (ref 13.0–17.0)
MCH: 27.3 pg (ref 26.0–34.0)
MCHC: 30.2 g/dL (ref 30.0–36.0)
MCV: 90.4 fL (ref 80.0–100.0)
Platelets: 436 10*3/uL — ABNORMAL HIGH (ref 150–400)
RBC: 3.66 MIL/uL — ABNORMAL LOW (ref 4.22–5.81)
RDW: 15.2 % (ref 11.5–15.5)
WBC: 13.8 10*3/uL — ABNORMAL HIGH (ref 4.0–10.5)
nRBC: 0 % (ref 0.0–0.2)

## 2022-07-06 SURGERY — IRRIGATION AND DEBRIDEMENT EXTREMITY
Anesthesia: General | Laterality: Left

## 2022-07-06 MED ORDER — LACTATED RINGERS IV SOLN: INTRAVENOUS | Status: DC | PRN

## 2022-07-06 MED ORDER — FENTANYL CITRATE (PF) 100 MCG/2ML IJ SOLN
INTRAMUSCULAR | Status: DC | PRN
  Administered 2022-07-06: 50 ug via INTRAVENOUS

## 2022-07-06 MED ORDER — MIDAZOLAM HCL 5 MG/5ML IJ SOLN
INTRAMUSCULAR | Status: DC | PRN
  Administered 2022-07-06 (×2): 1 mg via INTRAVENOUS

## 2022-07-06 MED ORDER — PROPOFOL 10 MG/ML IV BOLUS
INTRAVENOUS | Status: DC | PRN
  Administered 2022-07-06 (×2): 10 mg via INTRAVENOUS
  Administered 2022-07-06: 20 mg via INTRAVENOUS
  Administered 2022-07-06: 10 mg via INTRAVENOUS

## 2022-07-06 MED ORDER — FENTANYL CITRATE (PF) 100 MCG/2ML IJ SOLN: 25.0000 ug | INTRAMUSCULAR | Status: DC | PRN

## 2022-07-06 MED ORDER — GLYCOPYRROLATE 0.2 MG/ML IJ SOLN
INTRAMUSCULAR | Status: DC | PRN
  Administered 2022-07-06 (×2): .1 mg via INTRAVENOUS

## 2022-07-06 MED ORDER — ALBUMIN HUMAN 5 % IV SOLN
12.5000 g | Freq: Once | INTRAVENOUS | Status: AC
  Administered 2022-07-06: 12.5 g via INTRAVENOUS

## 2022-07-06 MED ORDER — MIDAZOLAM HCL 2 MG/2ML IJ SOLN
INTRAMUSCULAR | Status: AC
  Filled 2022-07-06: qty 2

## 2022-07-06 MED ORDER — CEFAZOLIN SODIUM-DEXTROSE 2-4 GM/100ML-% IV SOLN
2.0000 g | INTRAVENOUS | Status: AC
  Administered 2022-07-06: 2 g via INTRAVENOUS

## 2022-07-06 MED ORDER — CHLORHEXIDINE GLUCONATE 4 % EX SOLN: 60.0000 mL | Freq: Once | CUTANEOUS | Status: DC

## 2022-07-06 MED ORDER — ALBUMIN HUMAN 5 % IV SOLN
INTRAVENOUS | Status: AC
  Filled 2022-07-06: qty 250

## 2022-07-06 MED ORDER — LIDOCAINE HCL (CARDIAC) PF 100 MG/5ML IV SOSY
PREFILLED_SYRINGE | INTRAVENOUS | Status: DC | PRN
  Administered 2022-07-06: 40 mg via INTRAVENOUS

## 2022-07-06 MED ORDER — POVIDONE-IODINE 10 % EX SWAB
2.0000 | Freq: Once | CUTANEOUS | Status: AC
  Administered 2022-07-06: 2 via TOPICAL

## 2022-07-06 MED ORDER — ONDANSETRON HCL 4 MG/2ML IJ SOLN
INTRAMUSCULAR | Status: DC | PRN
  Administered 2022-07-06: 4 mg via INTRAVENOUS

## 2022-07-06 MED ORDER — PHENYLEPHRINE 80 MCG/ML (10ML) SYRINGE FOR IV PUSH (FOR BLOOD PRESSURE SUPPORT)
PREFILLED_SYRINGE | INTRAVENOUS | Status: DC | PRN
  Administered 2022-07-06: 80 ug via INTRAVENOUS

## 2022-07-06 MED ORDER — VANCOMYCIN HCL 1000 MG IV SOLR
INTRAVENOUS | Status: AC
  Filled 2022-07-06: qty 40

## 2022-07-06 MED ORDER — CHLORHEXIDINE GLUCONATE 0.12 % MT SOLN
OROMUCOSAL | Status: AC
  Administered 2022-07-06: 15 mL
  Filled 2022-07-06: qty 15

## 2022-07-06 MED ORDER — FENTANYL CITRATE (PF) 100 MCG/2ML IJ SOLN
INTRAMUSCULAR | Status: AC
  Filled 2022-07-06: qty 2

## 2022-07-06 MED ORDER — ACETAMINOPHEN 10 MG/ML IV SOLN: 1000.0000 mg | Freq: Once | INTRAVENOUS | Status: DC | PRN

## 2022-07-06 MED ORDER — CEFAZOLIN SODIUM-DEXTROSE 2-4 GM/100ML-% IV SOLN
INTRAVENOUS | Status: AC
  Filled 2022-07-06: qty 100

## 2022-07-06 SURGICAL SUPPLY — 35 items
ADH SKN CLS LQ APL DERMABOND (GAUZE/BANDAGES/DRESSINGS) ×2
BAG COUNTER SPONGE SURGICOUNT (BAG) IMPLANT
BAG SPNG CNTER NS LX DISP (BAG)
BLADE SURG 21 STRL SS (BLADE) ×2 IMPLANT
BNDG CMPR 5X6 CHSV STRCH STRL (GAUZE/BANDAGES/DRESSINGS)
BNDG COHESIVE 6X5 TAN ST LF (GAUZE/BANDAGES/DRESSINGS) IMPLANT
BNDG GAUZE DERMACEA FLUFF 4 (GAUZE/BANDAGES/DRESSINGS) ×4 IMPLANT
BNDG GZE DERMACEA 4 6PLY (GAUZE/BANDAGES/DRESSINGS) ×2
COVER SURGICAL LIGHT HANDLE (MISCELLANEOUS) ×4 IMPLANT
DERMABOND ADVANCED .7 DNX6 (GAUZE/BANDAGES/DRESSINGS) IMPLANT
DRAPE U-SHAPE 47X51 STRL (DRAPES) ×2 IMPLANT
DRSG ADAPTIC 3X8 NADH LF (GAUZE/BANDAGES/DRESSINGS) ×2 IMPLANT
DURAPREP 26ML APPLICATOR (WOUND CARE) ×2 IMPLANT
ELECT REM PT RETURN 9FT ADLT (ELECTROSURGICAL)
ELECTRODE REM PT RTRN 9FT ADLT (ELECTROSURGICAL) IMPLANT
GAUZE SPONGE 4X4 12PLY STRL (GAUZE/BANDAGES/DRESSINGS) ×2 IMPLANT
GLOVE BIOGEL PI IND STRL 9 (GLOVE) ×2 IMPLANT
GLOVE SURG ORTHO 9.0 STRL STRW (GLOVE) ×2 IMPLANT
GOWN STRL REUS W/ TWL XL LVL3 (GOWN DISPOSABLE) ×4 IMPLANT
GOWN STRL REUS W/TWL XL LVL3 (GOWN DISPOSABLE) ×2
HANDPIECE INTERPULSE COAX TIP (DISPOSABLE)
KIT BASIN OR (CUSTOM PROCEDURE TRAY) ×2 IMPLANT
KIT TURNOVER KIT B (KITS) ×2 IMPLANT
MANIFOLD NEPTUNE II (INSTRUMENTS) ×2 IMPLANT
NS IRRIG 1000ML POUR BTL (IV SOLUTION) ×2 IMPLANT
PACK ORTHO EXTREMITY (CUSTOM PROCEDURE TRAY) ×2 IMPLANT
PAD ARMBOARD 7.5X6 YLW CONV (MISCELLANEOUS) ×4 IMPLANT
SET HNDPC FAN SPRY TIP SCT (DISPOSABLE) IMPLANT
STOCKINETTE IMPERVIOUS 9X36 MD (GAUZE/BANDAGES/DRESSINGS) IMPLANT
SUT ETHILON 2 0 PSLX (SUTURE) ×2 IMPLANT
SWAB COLLECTION DEVICE MRSA (MISCELLANEOUS) ×2 IMPLANT
SWAB CULTURE ESWAB REG 1ML (MISCELLANEOUS) IMPLANT
TOWEL GREEN STERILE (TOWEL DISPOSABLE) ×2 IMPLANT
TUBE CONNECTING 12X1/4 (SUCTIONS) ×2 IMPLANT
YANKAUER SUCT BULB TIP NO VENT (SUCTIONS) ×2 IMPLANT

## 2022-07-06 NOTE — Anesthesia Postprocedure Evaluation (Signed)
Anesthesia Post Note  Patient: Lee Reilly  Procedure(s) Performed: LEFT HIP DEBRIDEMENT (Left)     Patient location during evaluation: PACU Anesthesia Type: MAC Level of consciousness: sedated Pain management: pain level controlled Vital Signs Assessment: post-procedure vital signs reviewed and stable Respiratory status: spontaneous breathing and respiratory function stable Cardiovascular status: stable Postop Assessment: no apparent nausea or vomiting Anesthetic complications: no   No notable events documented.  Last Vitals:  Vitals:   07/06/22 1640 07/06/22 1645  BP: (!) 87/58 (!) 87/64  Pulse: 98 91  Resp: (!) 24 (!) 24  Temp:    SpO2: 100% 100%    Last Pain:  Vitals:   07/06/22 1645  TempSrc:   PainSc: 0-No pain                 Kalil Woessner DANIEL

## 2022-07-06 NOTE — Interval H&P Note (Signed)
History and Physical Interval Note:  07/06/2022 6:44 AM  Lee Reilly  has presented today for surgery, with the diagnosis of Left Hip Wound Contamination.  The various methods of treatment have been discussed with the patient and family. After consideration of risks, benefits and other options for treatment, the patient has consented to  Procedure(s): LEFT HIP DEBRIDEMENT (Left) as a surgical intervention.  The patient's history has been reviewed, patient examined, no change in status, stable for surgery.  I have reviewed the patient's chart and labs.  Questions were answered to the patient's satisfaction.     Nadara Mustard

## 2022-07-06 NOTE — TOC Progression Note (Signed)
Transition of Care [facility]) - Progression Note    Patient Details  Name: Lee Reilly MRN: [id number] Date of Birth: 1985-06-30  Transition of Care [facility]) CM/SW Contact  Lorri Frederick, LCSW Phone Number: 07/06/2022, 3:10 PM  Clinical Narrative:    Per Jen/Select, pt does not have appropriate codes for LTAC admission.   Per Dr Lajoyce Corners, any IV abx needed at DC are still to be determined pending culture results.  Email received from pt mother, but no attachment with SNF options.  Referral sent out in hub for SNF, wound care referral.     Expected Discharge Plan: Skilled Nursing Facility Barriers to Discharge: Continued Medical Work up, SNF Pending bed offer  Expected Discharge Plan and Services     Post Acute Care Choice: Skilled Nursing Facility Living arrangements for the past 2 months: Single Family Home                                       Social Determinants of Health (SDOH) Interventions SDOH Screenings   Food Insecurity: No Food Insecurity (07/04/2022)  Housing: Low Risk  (07/04/2022)  Transportation Needs: No Transportation Needs (07/04/2022)  Utilities: Not At Risk (07/04/2022)  Depression (PHQ2-9): Low Risk  (05/29/2022)  Tobacco Use: Medium Risk (07/06/2022)    Readmission Risk Interventions    05/18/2022    2:25 PM  Readmission Risk Prevention Plan  Transportation Screening Complete  PCP or Specialist Appt within 3-5 Days Complete  HRI or Home Care Consult Complete  Social Work Consult for Recovery Care Planning/Counseling Complete  Palliative Care Screening Not Applicable  Medication Review Oceanographer) Complete

## 2022-07-06 NOTE — Transfer of Care (Signed)
Immediate Anesthesia Transfer of Care Note  Patient: Lee Reilly  Procedure(s) Performed: LEFT HIP DEBRIDEMENT (Left)  Patient Location: PACU  Anesthesia Type:MAC  Level of Consciousness: awake, alert , and oriented  Airway & Oxygen Therapy: Patient Spontanous Breathing  Post-op Assessment: Report given to RN and Post -op Vital signs reviewed and stable  Post vital signs: Reviewed and stable  Last Vitals:  Vitals Value Taken Time  BP 144/129 07/06/22 1556  Temp    Pulse 96 07/06/22 1557  Resp 27 07/06/22 1557  SpO2 100 % 07/06/22 1557  Vitals shown include unvalidated device data.  Last Pain:  Vitals:   07/06/22 1345  TempSrc:   PainSc: 0-No pain         Complications: No notable events documented.

## 2022-07-06 NOTE — Anesthesia Preprocedure Evaluation (Addendum)
Anesthesia Evaluation  Patient identified by MRN, date of birth, ID band Patient awake    Reviewed: Allergy & Precautions, NPO status , Patient's Chart, lab work & pertinent test results  Airway Mallampati: II  TM Distance: >3 FB Neck ROM: Full    Dental no notable dental hx.    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  negative psych ROS   GI/Hepatic negative GI ROS, Neg liver ROS,,,  Endo/Other  negative endocrine ROS    Renal/GU negative Renal ROS  negative genitourinary   Musculoskeletal  (+) Arthritis , Osteoarthritis,    Abdominal Normal abdominal exam  (+)   Peds  Hematology  (+) Blood dyscrasia, anemia   Anesthesia Other Findings   Reproductive/Obstetrics                             Anesthesia Physical Anesthesia Plan  ASA: 3  Anesthesia Plan: MAC   Post-op Pain Management: Minimal or no pain anticipated   Induction: Intravenous  PONV Risk Score and Plan: 2 and Ondansetron, Dexamethasone, Midazolam and Treatment may vary due to age or medical condition  Airway Management Planned: Simple Face Mask  Additional Equipment: None  Intra-op Plan:   Post-operative Plan: Extubation in OR  Informed Consent: I have reviewed the patients History and Physical, chart, labs and discussed the procedure including the risks, benefits and alternatives for the proposed anesthesia with the patient or authorized representative who has indicated his/her understanding and acceptance.     Dental advisory given  Plan Discussed with: CRNA, Anesthesiologist and Surgeon  Anesthesia Plan Comments:        Anesthesia Quick Evaluation

## 2022-07-06 NOTE — TOC Initial Note (Signed)
Transition of Care [facility]) - Initial/Assessment Note    Patient Details  Name: Lee Reilly MRN: [id number] Date of Birth: 06-02-1985  Transition of Care [facility]) CM/SW Contact:    Lee Frederick, LCSW Phone Number: 07/06/2022, 3:06 PM  Clinical Narrative:    CSW met with pt and mother regarding DC plan.  Pt readmitted after DC home with [facility], back for further wound care.  They are again saying they are open to SNF, mother talking about SNF facilities some distance away.  Mother states she will email CSW list, email address provided.  Pt declined SNF during last admission, had limited offers and was not OK with the offers received, chose to go home instead.  Mother talks to CSW later in the hall and again mentions possibly returning home with current provider,  [facility].    CSW spoke with Lee Reilly/PT.  She completed her eval, no PT needs that would require SNF.  So this will be wound care only referral.  She suggested LTAC be investigated as option.                Expected Discharge Plan: Skilled Nursing Facility Barriers to Discharge: Continued Medical Work up, SNF Pending bed offer   Patient Goals and CMS Choice            Expected Discharge Plan and Services     Post Acute Care Choice: Skilled Nursing Facility Living arrangements for the past 2 months: Single Family Home                                      Prior Living Arrangements/Services Living arrangements for the past 2 months: Single Family Home Lives with:: Parents Patient language and need for interpreter reviewed:: Yes Do you feel safe going back to the place where you live?: Yes      Need for Family Participation in Patient Care: Yes (Comment) Care giver support system in place?: Yes (comment) Current home services: Home RN ([facility]) Criminal Activity/Legal Involvement Pertinent to Current Situation/Hospitalization: No - Comment as needed  Activities of Daily Living Home Assistive  Devices/Equipment: Shower chair with back, Wheelchair, Other (Comment) (air mattress) ADL Screening (condition at time of admission) Patient's cognitive ability adequate to safely complete daily activities?: Yes Is the patient deaf or have difficulty hearing?: No Does the patient have difficulty seeing, even when wearing glasses/contacts?: No Does the patient have difficulty concentrating, remembering, or making decisions?: No Patient able to express need for assistance with ADLs?: Yes Does the patient have difficulty dressing or bathing?: No Independently performs ADLs?: No Communication: Independent Dressing (OT): Needs assistance Is this a change from baseline?: Pre-admission baseline Grooming: Independent Feeding: Independent Bathing: Independent with device (comment) Is this a change from baseline?: Pre-admission baseline Toileting: Independent with device (comment) In/Out Bed: Independent with device (comment) Walks in Home: Independent with device (comment) Does the patient have difficulty walking or climbing stairs?: Yes Weakness of Legs: Both Weakness of Arms/Hands: None  Permission Sought/Granted Permission sought to share information with : Family Supports Permission granted to share information with : Yes, Verbal Permission Granted  Share Information with NAME: mother,Lee Reilly  Permission granted to share info w AGENCY: SNF        Emotional Assessment Appearance:: Appears stated age Attitude/Demeanor/Rapport: Engaged Affect (typically observed): Appropriate, Pleasant Orientation: : Oriented to Self, Oriented to Place, Oriented to  Time, Oriented to Situation  Admission diagnosis:  Wound dehiscence, surgical, initial encounter [T81.31XA] Patient Active Problem List   Diagnosis Date Noted   Disruption of external surgical wound 07/04/2022   Wound dehiscence, surgical, initial encounter 07/04/2022   Acute blood loss anemia 06/27/2022   Status post surgery  06/25/2022   Hematoma of left hip 06/25/2022   Osteomyelitis of left hip (HCC) 05/10/2022   Skin ulcer of left lower leg with fat layer exposed (HCC) 05/04/2022   Ulcer of left lower leg (HCC) 05/04/2022   Ankle wound, left, subsequent encounter 06/27/2021   Medication monitoring encounter 06/27/2021   Abscess of right thigh    Septic arthritis of right foot (HCC)    Subacute osteomyelitis, left ankle and foot (HCC)    Leukocytosis    Pressure injury of skin 05/30/2021   Hypokalemia 05/29/2021   Microcytic anemia 05/29/2021   Protein-calorie malnutrition, severe (HCC) 05/29/2021   PCP NOTES >>>>>>>>>>>>>>>>>>>>>>>>>>>> 06/13/2015   Annual physical exam 05/01/2013   Acne 05/01/2013   Substance abuse (HCC) 09/05/2011   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 - [address] AT [facility] [address] [place] [address] Phone: [phone] Fax: [phone]  [facility] [address] [place] [address] Phone: [phone] Fax: [phone]     Social Determinants of Health (SDOH) Social History: SDOH Screenings   Food Insecurity: No Food Insecurity (07/04/2022)  Housing: Low Risk  (07/04/2022)  Transportation Needs: No Transportation Needs (07/04/2022)  Utilities: Not At Risk (07/04/2022)  Depression (PHQ2-9): Low Risk  (05/29/2022)  Tobacco Use: Medium Risk (07/06/2022)   SDOH Interventions:     Readmission Risk Interventions    05/18/2022    2:25 PM  Readmission Risk Prevention Plan  Transportation Screening Complete  PCP or Specialist Appt within 3-5 Days Complete  HRI or Home Care Consult Complete  Social Work Consult for Recovery Care Planning/Counseling Complete  Palliative Care Screening Not Applicable   Medication Review Oceanographer) Complete

## 2022-07-06 NOTE — Op Note (Signed)
07/06/2022  4:01 PM  PATIENT:  Lee Reilly    PRE-OPERATIVE DIAGNOSIS:  Left Hip Wound Contamination  POST-OPERATIVE DIAGNOSIS:  Same  PROCEDURE:  LEFT HIP DEBRIDEMENT Placement of customizable wound VAC. Placement of vancomycin powder 2 g.  SURGEON:  Nadara Mustard, MD  PHYSICIAN ASSISTANT:None ANESTHESIA:   General  PREOPERATIVE INDICATIONS:  [name] is a  37 y.o. male with a diagnosis of Left Hip Wound Contamination who failed conservative measures and elected for surgical management.    The risks benefits and alternatives were discussed with the patient preoperatively including but not limited to the risks of infection, bleeding, nerve injury, cardiopulmonary complications, the need for revision surgery, among others, and the patient was willing to proceed.  OPERATIVE IMPLANTS:   * No implants in log *  @ENCIMAGES @  OPERATIVE FINDINGS: There was recurrent stool covering the wound.  OPERATIVE PROCEDURE: Patient brought the operating room underwent a MAC anesthetic.  After adequate levels anesthesia obtained the left hip was prepped using Betadine and draped into a sterile field a timeout was called.  The sutures were removed the wound surface was contaminated.  There is no deep contamination.  The wound was irrigated with normal saline after cleansing the tissue was healthy and viable.  The wound was filled with 2 g of vancomycin powder.  #2 nylon was used to close the wound this was covered with a Prevena customizable wound VAC this had a good suction fit patient was taken the PACU in stable condition.  DISCHARGE PLANNING:  Antibiotic duration: Continue antibiotics will adjust according to culture sensitivities  Concern for recurrent contamination of the wound.  Follow-up: In the office 1 week post operative.

## 2022-07-06 NOTE — Consult Note (Signed)
[facility]       Reason for Consult:wound infection    Referring Physician: Dr. Lajoyce Corners  Principal Problem:   Wound dehiscence, surgical, initial encounter Active Problems:   Disruption of external surgical wound    vitamin C  1,000 mg Oral Daily   docusate sodium  100 mg Oral Daily   liver oil-zinc oxide   Topical BID   metroNIDAZOLE  500 mg Oral Q8H   nutrition supplement (JUVEN)  1 packet Oral BID BM   pantoprazole  40 mg Oral Daily   zinc sulfate  220 mg Oral Daily    Recommendations: Continue with antibiotics for now Will monitor culture and change as indicated If culture remains negative, I recommend linezolid 600 mg po twice a day for 10 days  Dr. Elinor Parkinson on over the weekend and will monitor cultures and make recommendations if he is discharged  Assessment: He has a wound infection after stool contamination and GPR noted.     HPI: Lee Reilly is a 37 y.o. male paraplegic s/p [place] procedure done by Dr. Lajoyce Corners 05/16/22 complicated by wound dehiscence and a hematoma evacuated by Dr. Ophelia Charter 06/24/21 here with left hip infection.  He had his wound VAC changed and the wound drape loosened and became contaminated with stool.  He underwent debridement here by Dr. Lajoyce Corners 4/24 with resection of contaminated tissue and gram stain now with gram positive rods, no growth yet on culture.     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    Neurogenic bladder    has no control, occasional cath   Paraplegia [facility])    Tumor T4 @ age 60month, had surgery   Pneumonia    age 56   Tachycardia     Social History   Tobacco Use   Smoking status: Former    Smokeless tobacco: Never   Tobacco comments:    << 1 ppd   Vaping Use   Vaping Use: Never used  Substance Use Topics   Alcohol use: Yes    Alcohol/week: 2.0 standard drinks of alcohol    Types: 2 Standard drinks or equivalent per week    Comment: socially    Drug use: Yes    Types: Marijuana    Comment: Smokes Marijunia, denies others    Family History  Problem Relation Age of Onset   Diabetes Father    Colon cancer Neg Hx    Prostate cancer Neg Hx    CAD Neg Hx     No Known Allergies  Physical Exam: Constitutional: in no apparent distress  Vitals:   07/05/22 2012 07/06/22 0801  BP: 106/64 103/61  Pulse: 98 99  Resp: 17 16  Temp: 98.2 F (36.8 C) 98.5 F (36.9 C)  SpO2: 100% 100%   EYES: anicteric Respiratory: normal respiratory effort GI: soft  Lab Results  Component Value Date   WBC 13.8 (H) 07/06/2022   HGB 10.0 (L) 07/06/2022   HCT 33.1 (L) 07/06/2022   MCV 90.4 07/06/2022   PLT 436 (H) 07/06/2022    Lab Results  Component Value  Date   CREATININE 0.37 (L) 07/06/2022   BUN 6 07/06/2022   NA 137 07/06/2022   K 4.2 07/06/2022   CL 108 07/06/2022   CO2 21 (L) 07/06/2022    Lab Results  Component Value Date   ALT 11 06/25/2022   AST 18 06/25/2022   ALKPHOS 53 06/25/2022     Microbiology: Recent Results (from the past 240 hour(s))  Aerobic/Anaerobic Culture w Gram Stain (surgical/deep wound)     Status: None (Preliminary result)   Collection Time: 07/04/22  3:37 PM   Specimen: Soft Tissue, Other  Result Value Ref Range Status   Specimen Description TISSUE LEFT HIP  Final   Special Requests PT ON ANCEF  Final   Gram Stain NO WBC SEEN RARE GRAM POSITIVE RODS   Final   Culture   Final    TOO YOUNG TO READ Performed at [facility], [address] [place], [place] [address]    Report Status PENDING  Incomplete    Gardiner Barefoot, MD [facility] [email or web address] 07/06/2022, 11:26 AM

## 2022-07-06 NOTE — NC FL2 (Signed)
[place] MEDICAID FL2 LEVEL OF CARE FORM     IDENTIFICATION  Patient Name: Lee Reilly Birthdate: 10-Oct-1985 Sex: male Admission Date (Current Location): 07/04/2022  [place] and [place] Number:  Haynes Bast [id number] Facility and Address:  [facility] [place], [address], [place], [place] [address]      Provider Number: [id number]  Attending Physician Name and Address:  Nadara Mustard, MD  Relative Name and Phone Number:  Lucille, [place] Mother [phone]    Current Level of Care: Hospital Recommended Level of Care: Skilled Nursing Facility Prior Approval Number:    Date Approved/Denied:   PASRR Number: [id number]  Discharge Plan: SNF    Current Diagnoses: Patient Active Problem List   Diagnosis Date Noted   Disruption of external surgical wound 07/04/2022   Wound dehiscence, surgical, initial encounter 07/04/2022   Acute blood loss anemia 06/27/2022   Status post surgery 06/25/2022   Hematoma of left hip 06/25/2022   Osteomyelitis of left hip (HCC) 05/10/2022   Skin ulcer of left lower leg with fat layer exposed (HCC) 05/04/2022   Ulcer of left lower leg (HCC) 05/04/2022   Ankle wound, left, subsequent encounter 06/27/2021   Medication monitoring encounter 06/27/2021   Abscess of right thigh    Septic arthritis of right foot (HCC)    Subacute osteomyelitis, left ankle and foot (HCC)    Leukocytosis    Pressure injury of skin 05/30/2021   Hypokalemia 05/29/2021   Microcytic anemia 05/29/2021   Protein-calorie malnutrition, severe (HCC) 05/29/2021   PCP NOTES >>>>>>>>>>>>>>>>>>>>>>>>>>>> 06/13/2015   Annual physical exam 05/01/2013   Acne 05/01/2013   Substance abuse (HCC) 09/05/2011   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   Paraplegia (HCC) 04/08/2007   Asthma 04/08/2007   Neurogenic bladder 04/08/2007   HIDRADENITIS SUPPURATIVA 04/08/2007    Orientation RESPIRATION BLADDER Height & Weight     Self, Time, Situation, Place  Normal  Incontinent Weight: 120 lb (54.4 kg) Height:  5\' 3"  (160 cm)  BEHAVIORAL SYMPTOMS/MOOD NEUROLOGICAL BOWEL NUTRITION STATUS      Incontinent Diet (see discharge summary)  AMBULATORY STATUS COMMUNICATION OF NEEDS Skin   Total Care (Paraplegic) Verbally Surgical wounds, Wound Vac, Other (Comment) (hip wound,Left ankle: Healing Stage 4:1.8cm x 1.6cm x 0.1cm minimal serosanguinous exudate  Pubic area (non pressure, partial thickness) 0.7cm x 0.4cm x 0.2cm, pink, moist, scant exudate  Left heel DTPI: 2.5cm x 3cm  purple discoloration, intact, no drai)                       Personal Care Assistance Level of Assistance  Bathing, Feeding, Dressing Bathing Assistance: Independent Feeding assistance: Independent Dressing Assistance: Independent     Functional Limitations Info  Sight, Hearing, Speech Sight Info: Adequate Hearing Info: Adequate Speech Info: Adequate    SPECIAL CARE FACTORS FREQUENCY        PT Frequency: No PT needs OT Frequency: No OT needs            Contractures Contractures Info: Not present    Additional Factors Info  Code Status, Allergies Code Status Info: full Allergies Info: NKA           Current Medications (07/06/2022):  This is the current hospital active medication list Current Facility-Administered Medications  Medication Dose Route Frequency Provider Last Rate Last Admin   0.9 %  sodium chloride infusion   Intravenous Continuous Nadara Mustard, MD 75 mL/hr at 07/04/22 1734 New Bag at 07/04/22 1734   [MAR Hold] acetaminophen (  TYLENOL) tablet 325-650 mg  325-650 mg Oral Q6H PRN Nadara Mustard, MD       [MAR Hold] alum & mag hydroxide-simeth (MAALOX/MYLANTA) 200-200-20 MG/5ML suspension 15-30 mL  15-30 mL Oral Q2H PRN Nadara Mustard, MD       [facility] Hold] ascorbic acid (VITAMIN C) tablet 1,000 mg  1,000 mg Oral Daily Nadara Mustard, MD   1,000 mg at 07/05/22 0945   [MAR Hold] bisacodyl (DULCOLAX) EC tablet 5 mg  5 mg Oral Daily PRN Nadara Mustard, MD        ceFAZolin (ANCEF) 2-4 GM/100ML-% IVPB            ceFAZolin (ANCEF) IVPB 2g/100 mL premix  2 g Intravenous On Call to OR Nadara Mustard, MD       [facility] Hold] ceFEPIme (MAXIPIME) 2 g in sodium chloride 0.9 % 100 mL IVPB  2 g Intravenous Q8H Reome, Earle J, RPH 200 mL/hr at 07/06/22 0951 2 g at 07/06/22 0951   chlorhexidine (HIBICLENS) 4 % liquid 4 Application  60 mL Topical Once Nadara Mustard, MD       [facility] Hold] docusate sodium (COLACE) capsule 100 mg  100 mg Oral Daily Nadara Mustard, MD   100 mg at 07/05/22 0944   [MAR Hold] guaiFENesin-dextromethorphan (ROBITUSSIN DM) 100-10 MG/5ML syrup 15 mL  15 mL Oral Q4H PRN Nadara Mustard, MD       [facility] Hold] hydrALAZINE (APRESOLINE) injection 5 mg  5 mg Intravenous Q20 Min PRN Nadara Mustard, MD       [MAR Hold] HYDROcodone-acetaminophen (NORCO) 7.5-325 MG per tablet 1-2 tablet  1-2 tablet Oral Q4H PRN Nadara Mustard, MD       [MAR Hold] HYDROcodone-acetaminophen (NORCO/VICODIN) 5-325 MG per tablet 1-2 tablet  1-2 tablet Oral Q4H PRN Nadara Mustard, MD       [MAR Hold] labetalol (NORMODYNE) injection 10 mg  10 mg Intravenous Q10 min PRN Nadara Mustard, MD       [MAR Hold] liver oil-zinc oxide (DESITIN) 40 % ointment   Topical BID Nadara Mustard, MD   Given at 07/06/22 [phone]   [facility] Hold] magnesium citrate solution 1 Bottle  1 Bottle Oral Once PRN Nadara Mustard, MD       [facility] Hold] magnesium sulfate IVPB 2 g 50 mL  2 g Intravenous Daily PRN Nadara Mustard, MD       [facility] Hold] metoprolol tartrate (LOPRESSOR) injection 2-5 mg  2-5 mg Intravenous Q2H PRN Nadara Mustard, MD       [MAR Hold] metroNIDAZOLE (FLAGYL) tablet 500 mg  500 mg Oral Q8H Nadara Mustard, MD   500 mg at 07/05/22 2255   [MAR Hold] morphine (PF) 2 MG/ML injection 0.5-1 mg  0.5-1 mg Intravenous Q2H PRN Nadara Mustard, MD       [facility] Hold] nutrition supplement (JUVEN) (JUVEN) powder packet 1 packet  1 packet Oral BID BM Nadara Mustard, MD   1 packet at 07/05/22 0945   [MAR Hold] ondansetron  (ZOFRAN) injection 4 mg  4 mg Intravenous Q6H PRN Nadara Mustard, MD       [MAR Hold] pantoprazole (PROTONIX) EC tablet 40 mg  40 mg Oral Daily Nadara Mustard, MD   40 mg at 07/05/22 0944   [MAR Hold] phenol (CHLORASEPTIC) mouth spray 1 spray  1 spray Mouth/Throat PRN Nadara Mustard, MD       [facility] Hold]  polyethylene glycol (MIRALAX / GLYCOLAX) packet 17 g  17 g Oral Daily PRN Nadara Mustard, MD       [facility] Hold] potassium chloride SA (KLOR-CON M) CR tablet 20-40 mEq  20-40 mEq Oral Daily PRN Nadara Mustard, MD       [MAR Hold] vancomycin (VANCOREADY) IVPB 750 mg/150 mL  750 mg Intravenous Q12H Reome, Earle J, RPH 150 mL/hr at 07/06/22 0623 750 mg at 07/06/22 0623   [MAR Hold] zinc sulfate capsule 220 mg  220 mg Oral Daily Nadara Mustard, MD   220 mg at 07/05/22 1610     Discharge Medications: Please see discharge summary for a list of discharge medications.  Relevant Imaging Results:  Relevant Lab Results:   Additional Information SS#: [id number].  Current wound care only referral.  Several recent hip debridements, other wounds detailed above. Paraplegic and at baseline with mobility, no PT needs. Possible IV antibiotic needs that have not been identified, pending cultures.  Lorri Frederick, LCSW

## 2022-07-07 ENCOUNTER — Encounter (HOSPITAL_COMMUNITY): Payer: Self-pay | Admitting: Orthopedic Surgery

## 2022-07-07 LAB — VANCOMYCIN, TROUGH: Vancomycin Tr: 17 ug/mL (ref 15–20)

## 2022-07-07 LAB — AEROBIC/ANAEROBIC CULTURE W GRAM STAIN (SURGICAL/DEEP WOUND): Gram Stain: NONE SEEN

## 2022-07-07 LAB — VANCOMYCIN, PEAK: Vancomycin Pk: 30 ug/mL (ref 30–40)

## 2022-07-07 MED ORDER — CHLORHEXIDINE GLUCONATE 0.12 % MT SOLN: 15.0000 mL | Freq: Once | OROMUCOSAL | Status: DC

## 2022-07-07 MED ORDER — ORAL CARE MOUTH RINSE: 15.0000 mL | Freq: Once | OROMUCOSAL | Status: DC

## 2022-07-07 MED ORDER — LACTATED RINGERS IV SOLN: INTRAVENOUS | Status: DC

## 2022-07-07 MED ORDER — VANCOMYCIN HCL 1250 MG/250ML IV SOLN
1250.0000 mg | INTRAVENOUS | Status: DC
  Administered 2022-07-08: 1250 mg via INTRAVENOUS
  Filled 2022-07-07 (×2): qty 250

## 2022-07-07 NOTE — Progress Notes (Signed)
Patient ID: Lee Reilly, male   DOB: 1985-11-22, 37 y.o.   MRN: [id number] Patient is postoperative day 1 repeat debridement of contaminated hip wound.  Anticipate discharge on Monday.  Thank you for infectious disease consult for evaluation for discharge antibiotic.  I will plan to cover the wounds with Dermabond and occlusive dressing prior to discharge to minimize risk of further contamination.

## 2022-07-07 NOTE — Progress Notes (Signed)
Pharmacy Antibiotic Note  Lee Reilly is a 37 y.o. 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  Pharmacy has been consulted for cefepime / vancomycin dosing.  Vancomycin levels on 750mg  Q12hr with AUC of 596 is at supratherapeutic. Will decrease to 1250mg  Q24hr, estimated AUC 497.   Plan: Decrease vancomycin to 1250mg  Q24hr  Cefepime 2 grams q8h Monitor cultures, clinical status, renal function, LOT    Height: 5\' 3"  (160 cm) Weight: 54.4 kg (120 lb) IBW/kg (Calculated) : 56.9  Temp (24hrs), Avg:97.9 F (36.6 C), Min:97.4 F (36.3 C), Max:98.6 F (37 C)  Recent Labs  Lab 07/04/22 1334 07/05/22 0256 07/06/22 0427 07/07/22 1032 07/07/22 1747  WBC 16.4* 15.7* 13.8*  --   --   CREATININE  --  0.43* 0.37*  --   --   VANCOTROUGH  --   --   --   --  17  VANCOPEAK  --   --   --  30  --      Estimated Creatinine Clearance: 96.3 mL/min (A) (by C-G formula based on SCr of 0.37 mg/dL (L)).    No Known Allergies  Antimicrobials this admission: Cefepime 4/24 >>  vanco 4/24 >>    Vanc levels  4/27 VP 30, VT 17 = AUC 596. Decrease to 1250mg  Q24h, estAUC 497  Microbiology 4/24 Hip tissue: Few corynebacteria, H paraphrophilus, mixed anaerobes   Thank you for allowing pharmacy to be a part of this patient's care.  Alphia Moh, PharmD, BCPS, BCCP Clinical Pharmacist  Please check AMION for all [facility] phone numbers After 10:00 PM, call Main Pharmacy [phone]

## 2022-07-07 NOTE — Progress Notes (Signed)
Id Brief note      Component 3 d ago  Specimen Description TISSUE LEFT HIP  Special Requests PT ON ANCEF  Gram Stain NO WBC SEEN RARE GRAM POSITIVE RODS Performed at [facility], [address] [place], [place] [address]  Culture CULTURE REINCUBATED FOR BETTER GROWTH MIXED ANAEROBIC FLORA PRESENT.  CALL LAB IF FURTHER IID REQUIRED.  Report Status PENDING     On Vancomycin, cefepime and metronidazole Discharge plan for Monday noted  Will follow cultures peripherally. Dr Luciana Axe back Monday.   Odette Fraction, MD Infectious Disease Physician [facility] [address] [place], [place] [address] Phone: [phone]  Fax: [phone]

## 2022-07-08 MED ORDER — SODIUM CHLORIDE 0.9 % IV SOLN
2.0000 g | INTRAVENOUS | Status: DC
  Administered 2022-07-08: 2 g via INTRAVENOUS
  Filled 2022-07-08 (×2): qty 20

## 2022-07-08 NOTE — Progress Notes (Signed)
ID Brief Note    4 d ago   Specimen Description TISSUE LEFT HIP  Special Requests PT ON ANCEF  Gram Stain NO WBC SEEN RARE GRAM POSITIVE RODS Performed at [facility], [address], [place], [place] [address]  Culture FEW DIPHTHEROIDS(CORYNEBACTERIUM SPECIES) Standardized susceptibility testing for this organism is not available. FEW HAEMOPHILUS PARAPHROPHILUS BETA LACTAMASE POSITIVE MIXED ANAEROBIC FLORA PRESENT.  CALL LAB IF FURTHER IID REQUIRED.  Report Status 07/07/2022 FINAL   Will switch cefepime to ceftriaxone, continue remaining abtx for now and  possibly PO abtx for discharge  Plan for discharge tomorrow noted per Ortho Dr Luciana Axe back tomorrow   Odette Fraction, MD Infectious Disease Physician [facility] [address] [place], [place] [address] Phone: [phone]  Fax: [phone]

## 2022-07-08 NOTE — Progress Notes (Signed)
Patient is stable. Wound VAC has a good seal and suction. A small amount of drainage in the canister. He is insensate below the waist. Anticipate discharge home tomorrow.

## 2022-07-09 ENCOUNTER — Other Ambulatory Visit (HOSPITAL_COMMUNITY): Payer: Self-pay

## 2022-07-09 ENCOUNTER — Telehealth: Payer: Self-pay | Admitting: Orthopedic Surgery

## 2022-07-09 DIAGNOSIS — T8131XA Disruption of external operation (surgical) wound, not elsewhere classified, initial encounter: Secondary | ICD-10-CM | POA: Diagnosis not present

## 2022-07-09 LAB — BASIC METABOLIC PANEL
Anion gap: 6 (ref 5–15)
BUN: <5 mg/dL — ABNORMAL LOW (ref 6–20)
CO2: 23 mmol/L (ref 22–32)
Calcium: 8.2 mg/dL — ABNORMAL LOW (ref 8.9–10.3)
Chloride: 106 mmol/L (ref 98–111)
Creatinine, Ser: 0.45 mg/dL — ABNORMAL LOW (ref 0.61–1.24)
GFR, Estimated: >60 mL/min (ref >60)
Glucose, Bld: 85 mg/dL (ref 70–99)
Potassium: 3.4 mmol/L — ABNORMAL LOW (ref 3.5–5.1)
Sodium: 135 mmol/L (ref 135–145)

## 2022-07-09 MED ORDER — AMOXICILLIN-POT CLAVULANATE 875-125 MG PO TABS
1.0000 | ORAL_TABLET | Freq: Two times a day (BID) | ORAL | 0 refills | Status: AC
  Filled 2022-07-09: qty 28, 14d supply, fill #0

## 2022-07-09 MED ORDER — LINEZOLID 600 MG PO TABS
600.0000 mg | ORAL_TABLET | Freq: Two times a day (BID) | ORAL | 0 refills | Status: AC
  Filled 2022-07-09: qty 28, 14d supply, fill #0

## 2022-07-09 NOTE — Plan of Care (Signed)
  Problem: Education: Goal: Knowledge of the prescribed therapeutic regimen will improve Outcome: Progressing Goal: Understanding of discharge needs will improve Outcome: Progressing   Problem: Education: Goal: Understanding of discharge needs will improve Outcome: Progressing

## 2022-07-09 NOTE — Telephone Encounter (Signed)
Pt's mother Sharlett Iles. She states pt is being discharged from hospital today and have a couple of medical question. She is asking for Autumn to call. Please call her at [phone].

## 2022-07-09 NOTE — Progress Notes (Addendum)
Patient ID: Lee Reilly, male   DOB: December 10, 1985, 37 y.o.   MRN: [id number] Wound VAC had a total of 300 cc clear serosanguineous fluid.  The wound VAC dressing was losing its seal.  The wound VAC was removed the wound was cleansed, there is good healthy granulation tissue.  A Aquacel dressing was applied.  Patient has an additional Aquacel dressing to take for discharge he will change as dressing becomes soiled.  Patient and his mother states the skilled nursing facilities were not good enough and they would like to discharge to home with home health therapy.  I will set up home health therapy through the office.  Will discharge on oral antibiotics as per infectious disease recommendations.  Left ankle wound has almost completely healed.  Wound is 1 cm diameter flat with 100% healthy granulation tissue. Continue with the Mepilex dressing.

## 2022-07-09 NOTE — Telephone Encounter (Signed)
Pt's mother to be calling with day center in [facility] info for pt. Dr. Lajoyce Corners spoke with her earlier this morning and they are looking into a day center and having the pt stay with his brother in the evenings. Will call to let me know.

## 2022-07-09 NOTE — Progress Notes (Signed)
    [facility]   Reason for visit: Follow up on wound infection  Interval History: culture growth noted with Corynebacterium, Haemophilus and mixed anaerobes as expected from stool.   Physical Exam: Constitutional:  Vitals:   07/09/22 0351 07/09/22 0755  BP: (!) 85/46 (!) 83/47  Pulse:  98  Resp: 17 16  Temp: 98 F (36.7 C) 98.4 F (36.9 C)  SpO2: 99% 93%   patient appears in NAD  Impression: wound infection   Plan: 1.  Plan for 2 weeks of linezolid 600 mg twice a day and Augmentin 875 twice a day

## 2022-07-09 NOTE — Discharge Summary (Signed)
Discharge Diagnoses:  Principal Problem:   Wound dehiscence, surgical, initial encounter Active Problems:   Disruption of external surgical wound   Surgeries: Procedure(s): LEFT HIP DEBRIDEMENT on 07/06/2022    Consultants:   Discharged Condition: Improved  Hospital Course: Lee Reilly is an 37 y.o. male who was admitted 07/04/2022 with a chief complaint of left hip wound contamination, with a final diagnosis of Left Hip Wound Contamination.  Patient was brought to the operating room on 07/06/2022 and underwent Procedure(s): LEFT HIP DEBRIDEMENT.    Patient was given perioperative antibiotics:  Anti-infectives (From admission, onward)    Start     Dose/Rate Route Frequency Ordered Stop   07/08/22 1000  vancomycin (VANCOREADY) IVPB 1250 mg/250 mL        1,250 mg 166.7 mL/hr over 90 Minutes Intravenous Every 24 hours 07/07/22 1900     07/08/22 0915  cefTRIAXone (ROCEPHIN) 2 g in sodium chloride 0.9 % 100 mL IVPB        2 g 200 mL/hr over 30 Minutes Intravenous Every 24 hours 07/08/22 0815     07/06/22 1345  ceFAZolin (ANCEF) IVPB 2g/100 mL premix        2 g 200 mL/hr over 30 Minutes Intravenous On call to O.R. 07/06/22 1333 07/06/22 1553   07/06/22 1341  ceFAZolin (ANCEF) 2-4 GM/100ML-% IVPB       Note to Pharmacy: Patsi Sears E: cabinet override      07/06/22 1341 07/06/22 1543   07/04/22 2200  metroNIDAZOLE (FLAGYL) tablet 500 mg        500 mg Oral Every 8 hours 07/04/22 1701 07/11/22 2159   07/04/22 1830  vancomycin (VANCOREADY) IVPB 750 mg/150 mL  Status:  Discontinued        750 mg 150 mL/hr over 60 Minutes Intravenous Every 12 hours 07/04/22 1731 07/07/22 1900   07/04/22 1815  ceFEPIme (MAXIPIME) 2 g in sodium chloride 0.9 % 100 mL IVPB  Status:  Discontinued        2 g 200 mL/hr over 30 Minutes Intravenous Every 8 hours 07/04/22 1727 07/08/22 0814   07/04/22 1542  vancomycin (VANCOCIN) powder  Status:  Discontinued          As needed 07/04/22 1542 07/04/22 1607    07/04/22 1407  ceFAZolin (ANCEF) IVPB 2g/100 mL premix        2 g 200 mL/hr over 30 Minutes Intravenous On call to O.R. 07/04/22 1225 07/04/22 1523     .  Patient was given sequential compression devices, early ambulation, and aspirin for DVT prophylaxis.  Recent vital signs: Patient Vitals for the past 24 hrs:  BP Temp Temp src Pulse Resp SpO2  07/09/22 0351 (!) 85/46 98 F (36.7 C) -- -- 17 99 %  07/08/22 2140 (!) 91/56 97.6 F (36.4 C) Oral 100 15 100 %  07/08/22 1439 (!) 114/57 97.6 F (36.4 C) Oral 92 15 100 %  07/08/22 0739 (!) 91/57 97.9 F (36.6 C) Oral -- 18 97 %  .  Recent laboratory studies: No results found.  Discharge Medications:   Allergies as of 07/09/2022   No Known Allergies      Medication List     STOP taking these medications    ciprofloxacin 500 MG tablet Commonly known as: CIPRO   metroNIDAZOLE 500 MG tablet Commonly known as: FLAGYL   oxyCODONE-acetaminophen 5-325 MG tablet Commonly known as: PERCOCET/ROXICET   silver sulfADIAZINE 1 % cream Commonly known as: SILVADENE  TAKE these medications    docusate sodium 100 MG capsule Commonly known as: COLACE Take 1 capsule (100 mg total) by mouth daily.        Diagnostic Studies: DG Chest Port 1 View  Result Date: 06/25/2022 CLINICAL DATA:  Tachycardia EXAM: PORTABLE CHEST 1 VIEW COMPARISON:  05/29/2021 FINDINGS: Stable appearance of the chest. Haziness of the left hemithorax, favoring overlying soft tissue rather than a pleural effusion as noted on the prior report. Right paramediastinal mass, unchanged. Right lung is otherwise clear. The heart is normal in size. Thoracolumbar spine fixation hardware. IMPRESSION: Right paramediastinal mass, unchanged from 2023. No prior CT is available for comparison. If this is not a known finding, CT chest with contrast is suggested for further evaluation. No acute cardiopulmonary disease. Electronically Signed   By: Charline Bills M.D.   On:  06/25/2022 01:46    Patient benefited maximally from their hospital stay and there were no complications.     Disposition: Discharge disposition: 01-Home or Self Care      Discharge Instructions     Call MD / Call 911   Complete by: As directed    If you experience chest pain or shortness of breath, CALL 911 and be transported to the hospital emergency room.  If you develope a fever above 101 F, pus (white drainage) or increased drainage or redness at the wound, or calf pain, call your surgeon's office.   Constipation Prevention   Complete by: As directed    Drink plenty of fluids.  Prune juice may be helpful.  You may use a stool softener, such as Colace (over the counter) 100 mg twice a day.  Use MiraLax (over the counter) for constipation as needed.   Diet - low sodium heart healthy   Complete by: As directed    Increase activity slowly as tolerated   Complete by: As directed    Post-operative opioid taper instructions:   Complete by: As directed    POST-OPERATIVE OPIOID TAPER INSTRUCTIONS: It is important to wean off of your opioid medication as soon as possible. If you do not need pain medication after your surgery it is ok to stop day one. Opioids include: Codeine, Hydrocodone(Norco, Vicodin), Oxycodone(Percocet, oxycontin) and hydromorphone amongst others.  Long term and even short term use of opiods can cause: Increased pain response Dependence Constipation Depression Respiratory depression And more.  Withdrawal symptoms can include Flu like symptoms Nausea, vomiting And more Techniques to manage these symptoms Hydrate well Eat regular healthy meals Stay active Use relaxation techniques(deep breathing, meditating, yoga) Do Not substitute Alcohol to help with tapering If you have been on opioids for less than two weeks and do not have pain than it is ok to stop all together.  Plan to wean off of opioids This plan should start within one week post op of your  joint replacement. Maintain the same interval or time between taking each dose and first decrease the dose.  Cut the total daily intake of opioids by one tablet each day Next start to increase the time between doses. The last dose that should be eliminated is the evening dose.          Follow-up Information     Nadara Mustard, MD Follow up in 1 week(s).   Specialty: Orthopedic Surgery Contact information: [address] [place] [address] [phone]                  Signed: Nadara Mustard 07/09/2022, 7:13  AM

## 2022-07-10 ENCOUNTER — Other Ambulatory Visit: Payer: Self-pay | Admitting: Orthopedic Surgery

## 2022-07-10 ENCOUNTER — Telehealth: Payer: Self-pay | Admitting: Orthopedic Surgery

## 2022-07-10 DIAGNOSIS — S91002S Unspecified open wound, left ankle, sequela: Secondary | ICD-10-CM

## 2022-07-10 DIAGNOSIS — T8131XA Disruption of external operation (surgical) wound, not elsewhere classified, initial encounter: Secondary | ICD-10-CM

## 2022-07-10 DIAGNOSIS — M869 Osteomyelitis, unspecified: Secondary | ICD-10-CM

## 2022-07-10 NOTE — Telephone Encounter (Signed)
Pt's mother called with a message for Autumn F. She states [facility] has not received referral for home health PT yet. Please send referral and call pt at [phone]

## 2022-07-10 NOTE — Telephone Encounter (Signed)
HH referral placed in chart to [facility]. Referral coordinator will send referral/notes/demo to them.

## 2022-07-10 NOTE — Progress Notes (Addendum)
07/10/22, 1300: CSW received email from Lee Reilly, pt mother, stating she contacted [facility] and they do not have HH referral.  CSW spoke with Ashe[facility].  They did receive a referral from Dr Lajoyce Corners office this morning and will reach out.  CSW spoke with Lee Reilly and confirmed this, she will call [facility] as well. Daleen Squibb, MSW, LCSW 4/30/20241:19 PM

## 2022-07-11 ENCOUNTER — Telehealth: Payer: Self-pay | Admitting: Orthopedic Surgery

## 2022-07-11 DIAGNOSIS — Z419 Encounter for procedure for purposes other than remedying health state, unspecified: Secondary | ICD-10-CM | POA: Diagnosis not present

## 2022-07-11 DIAGNOSIS — G8220 Paraplegia, unspecified: Secondary | ICD-10-CM

## 2022-07-11 DIAGNOSIS — M869 Osteomyelitis, unspecified: Secondary | ICD-10-CM

## 2022-07-11 DIAGNOSIS — R32 Unspecified urinary incontinence: Secondary | ICD-10-CM | POA: Diagnosis not present

## 2022-07-11 NOTE — Telephone Encounter (Signed)
Patient's mother request a referral from Dr. Lajoyce Corners to [facility] [phone] for the patient.    She is aware that it maybe Monday before she hear back from Dr Lajoyce Corners.

## 2022-07-11 NOTE — Addendum Note (Signed)
Addendum  created 07/11/22 1907 by Heather Roberts, MD   Clinical Note Signed

## 2022-07-13 ENCOUNTER — Telehealth: Payer: Self-pay | Admitting: *Deleted

## 2022-07-13 NOTE — Transitions of Care (Post Inpatient/ED Visit) (Signed)
   07/13/2022  Name: Lee Reilly MRN: [id number] DOB: [birth year]  Today's TOC FU Call Status: Today's TOC FU Call Status:: Successful TOC FU Call Competed TOC FU Call Complete Date: 07/13/22  Transition Care Management Follow-up Telephone Call Date of Discharge: 07/09/22 Discharge Facility: [facility]) Type of Discharge: Inpatient Admission Primary Inpatient Discharge Diagnosis:: Left hip debridement How have you been since you were released from the hospital?: Better Any questions or concerns?: Yes  Items Reviewed: Did you receive and understand the discharge instructions provided?: Yes Medications obtained,verified, and reconciled?: Yes (Medications Reviewed) Any new allergies since your discharge?: No Dietary orders reviewed?: Yes Type of Diet Ordered:: low sodium, heart healthy Do you have support at home?: Yes People in Home: parent(s) Name of Support/Comfort Primary Source: Mother/Theresemia  Medications Reviewed Today: Medications Reviewed Today     Reviewed by Heidi Dach, RN (Registered Nurse) on 07/13/22 at 1025  Med List Status: <None>   Medication Order Taking? Sig Documenting Provider Last Dose Status Informant  amoxicillin-clavulanate (AUGMENTIN) 875-125 MG tablet [id number] Yes Take 1 tablet by mouth 2 (two) times daily for 14 days. Gardiner Barefoot, MD Taking Active   docusate sodium (COLACE) 100 MG capsule [id number] No Take 1 capsule (100 mg total) by mouth daily.  Patient not taking: Reported on 06/21/2022   Lanae Boast, MD Not Taking Active Self  linezolid (ZYVOX) 600 MG tablet [id number] Yes Take 1 tablet (600 mg total) by mouth 2 (two) times daily for 14 days. Comer, Belia Heman, MD Taking Active   Med List Note Vela Prose 05/29/21 1427): Patient's 1st name is pronounced "ahh-shish"            Home Care and Equipment/Supplies: Were Home Health Services Ordered?: Yes Name of Home Health Agency:: [facility] RN with [facility] Has Agency set up a  time to come to your home?: Yes First Home Health Visit Date: 07/11/22 Any new equipment or medical supplies ordered?: No  Functional Questionnaire: Do you need assistance with bathing/showering or dressing?: Yes Do you need assistance with meal preparation?: Yes Do you need assistance with eating?: No Do you have difficulty maintaining continence: Yes Do you need assistance with getting out of bed/getting out of a chair/moving?: Yes Do you have difficulty managing or taking your medications?: No  Follow up appointments reviewed: PCP Follow-up appointment confirmed?: No MD Provider Line Number:[phone] Given: Yes Specialist Hospital Follow-up appointment confirmed?: Yes Date of Specialist follow-up appointment?: 07/19/22 Follow-Up Specialty Provider:: Dr. Lajoyce Corners Do you need transportation to your follow-up appointment?: No Do you understand care options if your condition(s) worsen?: Yes-patient verbalized understanding  SDOH Interventions Today    Flowsheet Row Most Recent Value  SDOH Interventions   Transportation Interventions Intervention Not Indicated      The patient was given information about care management services as a benefit of their Medicaid health plan today.   Designated Arboriculturist (DPR)  wishes to consider information provided and/or speak with a member of the care team before deciding about enrollment in care management services.   RNCM will reach out again on 07/27/22 at 9 am as requested by DPR.  Estanislado Emms RN, BSN [place]  [facility] RN Care Coordinator [phone]

## 2022-07-16 NOTE — Telephone Encounter (Signed)
Please advise. OK for referral? 

## 2022-07-16 NOTE — Telephone Encounter (Signed)
Referral entered. I called patient's mom and advised.

## 2022-07-19 ENCOUNTER — Encounter: Payer: Self-pay | Admitting: Orthopedic Surgery

## 2022-07-19 ENCOUNTER — Telehealth: Payer: Self-pay | Admitting: Orthopedic Surgery

## 2022-07-19 ENCOUNTER — Ambulatory Visit: Payer: No Typology Code available for payment source | Admitting: Orthopedic Surgery

## 2022-07-19 DIAGNOSIS — G8220 Paraplegia, unspecified: Secondary | ICD-10-CM

## 2022-07-19 DIAGNOSIS — S91002S Unspecified open wound, left ankle, sequela: Secondary | ICD-10-CM

## 2022-07-19 DIAGNOSIS — M869 Osteomyelitis, unspecified: Secondary | ICD-10-CM

## 2022-07-19 NOTE — Telephone Encounter (Signed)
Patients mother states that no referral has been sent to [facility] [address] asking if someone could resend it and let her know when its done Fax number..[phone].

## 2022-07-19 NOTE — Telephone Encounter (Signed)
I called and left vm to return my call
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
## 2022-07-23 ENCOUNTER — Ambulatory Visit: Payer: No Typology Code available for payment source | Admitting: Internal Medicine

## 2022-07-25 ENCOUNTER — Telehealth: Payer: Self-pay | Admitting: Orthopedic Surgery

## 2022-07-25 NOTE — Telephone Encounter (Signed)
I called and sw pt's mother to advise that the pt is having a dry dressing change daily and that this is considered a teachable skill and they will not come out to do the dressing change more frequently. Pt's mother voicd understanding and will call with questions.

## 2022-07-25 NOTE — Telephone Encounter (Signed)
Patient's mother called advised [facility] is coming twice a week to see her son. [name] said starting this week [facility] is coming once a week. [name] asked if Dr. Lajoyce Corners can request that [facility] come to see her don twice a week for 2 more weeks?  The number to contact [name] is [phone]

## 2022-07-27 ENCOUNTER — Other Ambulatory Visit: Payer: No Typology Code available for payment source | Admitting: *Deleted

## 2022-07-27 NOTE — Patient Outreach (Signed)
Lee Reilly was referred to the [facility] Managed Care High Risk team for assistance with care coordination and care management services. Care coordination/care management services as part of the Medicaid benefit was offered to the patient today. The patient declined assistance offered today.   Patient's DPR would like to wait to begin CM services until after Patient's would is healed. DPR will speak to Mr Stauff and contact RNCM when CM services are desired. Patient is planning to establish PCP with a [facility] provider in the near future.  Plan: The Medicaid Managed Care High Risk team is available at any time in the future to assist with care coordination/care management services upon referral.   Estanislado Emms RN, BSN   Managed [facility] RN Care Coordinator [phone]

## 2022-07-27 NOTE — Patient Instructions (Signed)
Thank you for taking time to speak with me today about care coordination and care management services available to you at no cost as part of your Medicaid benefit. These services are voluntary. Our team is available to provide assistance regarding your health care needs at any time. Please do not hesitate to reach out to me if we can be of service to you at any time in the future.   Zavien Clubb RN, BSN [facility]  Managed Medicaid RN Care Coordinator [phone]   

## 2022-08-02 ENCOUNTER — Ambulatory Visit: Payer: No Typology Code available for payment source | Admitting: Orthopedic Surgery

## 2022-08-02 ENCOUNTER — Encounter: Payer: Self-pay | Admitting: Orthopedic Surgery

## 2022-08-02 DIAGNOSIS — M869 Osteomyelitis, unspecified: Secondary | ICD-10-CM
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
## 2022-08-03 ENCOUNTER — Telehealth: Payer: Self-pay | Admitting: Orthopedic Surgery

## 2022-08-03 NOTE — Telephone Encounter (Signed)
I called and sw pt's mother and she wanted to confirm dressing orders. Pack with 4x4 gauze and cover wound change daily. Voiced understanding and will call with any questions.

## 2022-08-03 NOTE — Telephone Encounter (Signed)
PT'S MOM HAS A QUESTION ABOUT PT'S WOUND. PLEASE CALL PT'S MOM Lee Reilly AT [phone]

## 2022-08-05 DIAGNOSIS — J45909 Unspecified asthma, uncomplicated: Secondary | ICD-10-CM | POA: Diagnosis not present

## 2022-08-05 DIAGNOSIS — C7490 Malignant neoplasm of unspecified part of unspecified adrenal gland: Secondary | ICD-10-CM | POA: Diagnosis not present

## 2022-08-05 DIAGNOSIS — G8220 Paraplegia, unspecified: Secondary | ICD-10-CM | POA: Diagnosis not present

## 2022-08-05 DIAGNOSIS — T8131XD Disruption of external operation (surgical) wound, not elsewhere classified, subsequent encounter: Secondary | ICD-10-CM | POA: Diagnosis not present

## 2022-08-05 DIAGNOSIS — Z556 Problems related to health literacy: Secondary | ICD-10-CM | POA: Diagnosis not present

## 2022-08-05 DIAGNOSIS — S7002XD Contusion of left hip, subsequent encounter: Secondary | ICD-10-CM | POA: Diagnosis not present

## 2022-08-05 DIAGNOSIS — D62 Acute posthemorrhagic anemia: Secondary | ICD-10-CM | POA: Diagnosis not present

## 2022-08-05 DIAGNOSIS — L709 Acne, unspecified: Secondary | ICD-10-CM | POA: Diagnosis not present

## 2022-08-05 DIAGNOSIS — M96840 Postprocedural hematoma of a musculoskeletal structure following a musculoskeletal system procedure: Secondary | ICD-10-CM | POA: Diagnosis not present

## 2022-08-07 ENCOUNTER — Telehealth: Payer: Self-pay | Admitting: Orthopedic Surgery

## 2022-08-07 DIAGNOSIS — G8220 Paraplegia, unspecified: Secondary | ICD-10-CM | POA: Diagnosis not present

## 2022-08-07 DIAGNOSIS — R3981 Functional urinary incontinence: Secondary | ICD-10-CM | POA: Diagnosis not present

## 2022-08-07 NOTE — Telephone Encounter (Signed)
Patients mother asking to speak to Lee Reilly about patients dressing. Please call

## 2022-08-07 NOTE — Telephone Encounter (Signed)
SW Lee Reilly and she wanted to make sure that the gauze to pack the hip wound is dry. She said the nurse at [facility] is using wet to dry on the wound. I let her know that we are only using dry gauze to pack it open with. Do not use wet to dry.

## 2022-08-08 DIAGNOSIS — G8220 Paraplegia, unspecified: Secondary | ICD-10-CM | POA: Diagnosis not present

## 2022-08-11 DIAGNOSIS — Z419 Encounter for procedure for purposes other than remedying health state, unspecified: Secondary | ICD-10-CM | POA: Diagnosis not present

## 2022-08-11 DIAGNOSIS — R32 Unspecified urinary incontinence: Secondary | ICD-10-CM | POA: Diagnosis not present

## 2022-08-14 ENCOUNTER — Telehealth: Payer: Self-pay | Admitting: Orthopedic Surgery

## 2022-08-14 NOTE — Telephone Encounter (Signed)
faxed

## 2022-08-14 NOTE — Telephone Encounter (Signed)
Shanique (LPN) called from [facility] called requesting wound orders be faxed to [phone]

## 2022-08-17 DIAGNOSIS — M869 Osteomyelitis, unspecified: Secondary | ICD-10-CM | POA: Diagnosis not present

## 2022-08-17 DIAGNOSIS — G8220 Paraplegia, unspecified: Secondary | ICD-10-CM | POA: Diagnosis not present

## 2022-08-17 DIAGNOSIS — Z89622 Acquired absence of left hip joint: Secondary | ICD-10-CM | POA: Diagnosis not present

## 2022-08-17 DIAGNOSIS — Z7409 Other reduced mobility: Secondary | ICD-10-CM | POA: Diagnosis not present

## 2022-08-29 ENCOUNTER — Telehealth: Payer: Self-pay | Admitting: Orthopedic Surgery

## 2022-08-29 DIAGNOSIS — G8220 Paraplegia, unspecified: Secondary | ICD-10-CM | POA: Diagnosis not present

## 2022-08-29 DIAGNOSIS — S7002XD Contusion of left hip, subsequent encounter: Secondary | ICD-10-CM | POA: Diagnosis not present

## 2022-08-29 DIAGNOSIS — J45909 Unspecified asthma, uncomplicated: Secondary | ICD-10-CM | POA: Diagnosis not present

## 2022-08-29 DIAGNOSIS — L709 Acne, unspecified: Secondary | ICD-10-CM | POA: Diagnosis not present

## 2022-08-29 DIAGNOSIS — M96840 Postprocedural hematoma of a musculoskeletal structure following a musculoskeletal system procedure: Secondary | ICD-10-CM | POA: Diagnosis not present

## 2022-08-29 DIAGNOSIS — Z556 Problems related to health literacy: Secondary | ICD-10-CM | POA: Diagnosis not present

## 2022-08-29 DIAGNOSIS — T8131XD Disruption of external operation (surgical) wound, not elsewhere classified, subsequent encounter: Secondary | ICD-10-CM | POA: Diagnosis not present

## 2022-08-29 DIAGNOSIS — C7490 Malignant neoplasm of unspecified part of unspecified adrenal gland: Secondary | ICD-10-CM | POA: Diagnosis not present

## 2022-08-29 DIAGNOSIS — D62 Acute posthemorrhagic anemia: Secondary | ICD-10-CM | POA: Diagnosis not present

## 2022-08-29 NOTE — Telephone Encounter (Signed)
Wille Glaser (OT Assist) called from [facility] requesting orders for OT for pt. Please fax referral to [phone]. Ari phone number is [phone].

## 2022-08-29 NOTE — Telephone Encounter (Signed)
Referral for OT eval and treat faxed to number provided below.

## 2022-09-03 ENCOUNTER — Encounter: Payer: Self-pay | Admitting: Orthopedic Surgery

## 2022-09-03 ENCOUNTER — Ambulatory Visit (INDEPENDENT_AMBULATORY_CARE_PROVIDER_SITE_OTHER): Payer: No Typology Code available for payment source | Admitting: Orthopedic Surgery

## 2022-09-03 DIAGNOSIS — G8220 Paraplegia, unspecified: Secondary | ICD-10-CM

## 2022-09-03 DIAGNOSIS — S71002S Unspecified open wound, left hip, sequela: Secondary | ICD-10-CM

## 2022-09-03 MED ORDER — MEDIHONEY WOUND/BURN DRESSING EX PSTE: 1.0000 | PASTE | Freq: Every day | CUTANEOUS | 3 refills | Status: AC
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
## 2022-09-04 DIAGNOSIS — L732 Hidradenitis suppurativa: Secondary | ICD-10-CM | POA: Diagnosis not present

## 2022-09-04 DIAGNOSIS — E43 Unspecified severe protein-calorie malnutrition: Secondary | ICD-10-CM | POA: Diagnosis not present

## 2022-09-04 DIAGNOSIS — T8131XA Disruption of external operation (surgical) wound, not elsewhere classified, initial encounter: Secondary | ICD-10-CM | POA: Diagnosis not present

## 2022-09-04 DIAGNOSIS — L709 Acne, unspecified: Secondary | ICD-10-CM | POA: Diagnosis not present

## 2022-09-04 DIAGNOSIS — M96840 Postprocedural hematoma of a musculoskeletal structure following a musculoskeletal system procedure: Secondary | ICD-10-CM | POA: Diagnosis not present

## 2022-09-04 DIAGNOSIS — N319 Neuromuscular dysfunction of bladder, unspecified: Secondary | ICD-10-CM | POA: Diagnosis not present

## 2022-09-04 DIAGNOSIS — D509 Iron deficiency anemia, unspecified: Secondary | ICD-10-CM | POA: Diagnosis not present

## 2022-09-04 DIAGNOSIS — Z87891 Personal history of nicotine dependence: Secondary | ICD-10-CM | POA: Diagnosis not present

## 2022-09-04 DIAGNOSIS — G8220 Paraplegia, unspecified: Secondary | ICD-10-CM | POA: Diagnosis not present

## 2022-09-04 DIAGNOSIS — F1910 Other psychoactive substance abuse, uncomplicated: Secondary | ICD-10-CM | POA: Diagnosis not present

## 2022-09-04 DIAGNOSIS — J45909 Unspecified asthma, uncomplicated: Secondary | ICD-10-CM | POA: Diagnosis not present

## 2022-09-04 DIAGNOSIS — C7490 Malignant neoplasm of unspecified part of unspecified adrenal gland: Secondary | ICD-10-CM | POA: Diagnosis not present

## 2022-09-04 DIAGNOSIS — T8149XA Infection following a procedure, other surgical site, initial encounter: Secondary | ICD-10-CM | POA: Diagnosis not present

## 2022-09-06 DIAGNOSIS — R3981 Functional urinary incontinence: Secondary | ICD-10-CM | POA: Diagnosis not present

## 2022-09-06 DIAGNOSIS — G8220 Paraplegia, unspecified: Secondary | ICD-10-CM | POA: Diagnosis not present

## 2022-09-07 ENCOUNTER — Telehealth: Payer: Self-pay | Admitting: Orthopedic Surgery

## 2022-09-07 NOTE — Telephone Encounter (Signed)
Received call from [facility] -nurse with [facility] asking if there is a new wound care order.  Lee Reilly asked if the order can be faxed over? The fax# is [phone]   The ph# is [phone]

## 2022-09-10 DIAGNOSIS — Z419 Encounter for procedure for purposes other than remedying health state, unspecified: Secondary | ICD-10-CM | POA: Diagnosis not present

## 2022-09-10 DIAGNOSIS — R32 Unspecified urinary incontinence: Secondary | ICD-10-CM | POA: Diagnosis not present

## 2022-09-10 NOTE — Telephone Encounter (Signed)
Faxed over notes to [facility].

## 2022-09-13 DIAGNOSIS — R296 Repeated falls: Secondary | ICD-10-CM | POA: Diagnosis not present

## 2022-09-14 DIAGNOSIS — R296 Repeated falls: Secondary | ICD-10-CM | POA: Diagnosis not present

## 2022-09-15 DIAGNOSIS — R296 Repeated falls: Secondary | ICD-10-CM | POA: Diagnosis not present

## 2022-09-16 DIAGNOSIS — R296 Repeated falls: Secondary | ICD-10-CM | POA: Diagnosis not present

## 2022-09-17 DIAGNOSIS — R296 Repeated falls: Secondary | ICD-10-CM | POA: Diagnosis not present

## 2022-09-18 DIAGNOSIS — T8149XA Infection following a procedure, other surgical site, initial encounter: Secondary | ICD-10-CM | POA: Diagnosis not present

## 2022-09-18 DIAGNOSIS — M96840 Postprocedural hematoma of a musculoskeletal structure following a musculoskeletal system procedure: Secondary | ICD-10-CM | POA: Diagnosis not present

## 2022-09-18 DIAGNOSIS — G8220 Paraplegia, unspecified: Secondary | ICD-10-CM | POA: Diagnosis not present

## 2022-09-18 DIAGNOSIS — F1910 Other psychoactive substance abuse, uncomplicated: Secondary | ICD-10-CM | POA: Diagnosis not present

## 2022-09-18 DIAGNOSIS — D509 Iron deficiency anemia, unspecified: Secondary | ICD-10-CM | POA: Diagnosis not present

## 2022-09-18 DIAGNOSIS — L732 Hidradenitis suppurativa: Secondary | ICD-10-CM | POA: Diagnosis not present

## 2022-09-18 DIAGNOSIS — T8131XA Disruption of external operation (surgical) wound, not elsewhere classified, initial encounter: Secondary | ICD-10-CM | POA: Diagnosis not present

## 2022-09-18 DIAGNOSIS — L709 Acne, unspecified: Secondary | ICD-10-CM | POA: Diagnosis not present

## 2022-09-18 DIAGNOSIS — J45909 Unspecified asthma, uncomplicated: Secondary | ICD-10-CM | POA: Diagnosis not present

## 2022-09-18 DIAGNOSIS — N319 Neuromuscular dysfunction of bladder, unspecified: Secondary | ICD-10-CM | POA: Diagnosis not present

## 2022-09-18 DIAGNOSIS — C7490 Malignant neoplasm of unspecified part of unspecified adrenal gland: Secondary | ICD-10-CM | POA: Diagnosis not present

## 2022-09-18 DIAGNOSIS — Z87891 Personal history of nicotine dependence: Secondary | ICD-10-CM | POA: Diagnosis not present

## 2022-09-18 DIAGNOSIS — R296 Repeated falls: Secondary | ICD-10-CM | POA: Diagnosis not present

## 2022-09-18 DIAGNOSIS — E43 Unspecified severe protein-calorie malnutrition: Secondary | ICD-10-CM | POA: Diagnosis not present

## 2022-09-19 DIAGNOSIS — Z9889 Other specified postprocedural states: Secondary | ICD-10-CM | POA: Diagnosis not present

## 2022-09-19 DIAGNOSIS — Z7409 Other reduced mobility: Secondary | ICD-10-CM | POA: Diagnosis not present

## 2022-09-19 DIAGNOSIS — G8220 Paraplegia, unspecified: Secondary | ICD-10-CM | POA: Diagnosis not present

## 2022-09-19 DIAGNOSIS — R296 Repeated falls: Secondary | ICD-10-CM | POA: Diagnosis not present

## 2022-09-19 DIAGNOSIS — Z89622 Acquired absence of left hip joint: Secondary | ICD-10-CM | POA: Diagnosis not present

## 2022-09-19 DIAGNOSIS — Z789 Other specified health status: Secondary | ICD-10-CM | POA: Diagnosis not present

## 2022-09-19 DIAGNOSIS — M869 Osteomyelitis, unspecified: Secondary | ICD-10-CM | POA: Diagnosis not present

## 2022-09-20 DIAGNOSIS — R296 Repeated falls: Secondary | ICD-10-CM | POA: Diagnosis not present

## 2022-09-21 DIAGNOSIS — R296 Repeated falls: Secondary | ICD-10-CM | POA: Diagnosis not present

## 2022-09-22 DIAGNOSIS — R296 Repeated falls: Secondary | ICD-10-CM | POA: Diagnosis not present

## 2022-09-23 DIAGNOSIS — R296 Repeated falls: Secondary | ICD-10-CM | POA: Diagnosis not present

## 2022-09-24 DIAGNOSIS — R296 Repeated falls: Secondary | ICD-10-CM | POA: Diagnosis not present

## 2022-09-25 DIAGNOSIS — R296 Repeated falls: Secondary | ICD-10-CM | POA: Diagnosis not present

## 2022-09-26 DIAGNOSIS — R296 Repeated falls: Secondary | ICD-10-CM | POA: Diagnosis not present

## 2022-09-27 DIAGNOSIS — R296 Repeated falls: Secondary | ICD-10-CM | POA: Diagnosis not present

## 2022-09-29 DIAGNOSIS — R296 Repeated falls: Secondary | ICD-10-CM | POA: Diagnosis not present

## 2022-09-30 DIAGNOSIS — R296 Repeated falls: Secondary | ICD-10-CM | POA: Diagnosis not present

## 2022-10-01 ENCOUNTER — Ambulatory Visit (INDEPENDENT_AMBULATORY_CARE_PROVIDER_SITE_OTHER): Payer: No Typology Code available for payment source | Admitting: Orthopedic Surgery

## 2022-10-01 ENCOUNTER — Encounter: Payer: Self-pay | Admitting: Orthopedic Surgery

## 2022-10-01 DIAGNOSIS — G8220 Paraplegia, unspecified: Secondary | ICD-10-CM

## 2022-10-01 DIAGNOSIS — S71002S Unspecified open wound, left hip, sequela: Secondary | ICD-10-CM
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
## 2022-10-02 DIAGNOSIS — R296 Repeated falls: Secondary | ICD-10-CM | POA: Diagnosis not present

## 2022-10-03 DIAGNOSIS — R296 Repeated falls: Secondary | ICD-10-CM | POA: Diagnosis not present

## 2022-10-04 DIAGNOSIS — R296 Repeated falls: Secondary | ICD-10-CM | POA: Diagnosis not present

## 2022-10-07 DIAGNOSIS — R3981 Functional urinary incontinence: Secondary | ICD-10-CM | POA: Diagnosis not present

## 2022-10-07 DIAGNOSIS — G8220 Paraplegia, unspecified: Secondary | ICD-10-CM | POA: Diagnosis not present

## 2022-10-08 DIAGNOSIS — M869 Osteomyelitis, unspecified: Secondary | ICD-10-CM | POA: Diagnosis not present

## 2022-10-08 DIAGNOSIS — Z9889 Other specified postprocedural states: Secondary | ICD-10-CM | POA: Diagnosis not present

## 2022-10-08 DIAGNOSIS — Z7409 Other reduced mobility: Secondary | ICD-10-CM | POA: Diagnosis not present

## 2022-10-08 DIAGNOSIS — Z89622 Acquired absence of left hip joint: Secondary | ICD-10-CM | POA: Diagnosis not present

## 2022-10-08 DIAGNOSIS — G8220 Paraplegia, unspecified: Secondary | ICD-10-CM | POA: Diagnosis not present

## 2022-10-08 DIAGNOSIS — Z789 Other specified health status: Secondary | ICD-10-CM | POA: Diagnosis not present

## 2022-10-11 DIAGNOSIS — R32 Unspecified urinary incontinence: Secondary | ICD-10-CM | POA: Diagnosis not present

## 2022-10-11 DIAGNOSIS — Z419 Encounter for procedure for purposes other than remedying health state, unspecified: Secondary | ICD-10-CM | POA: Diagnosis not present

## 2022-10-29 ENCOUNTER — Ambulatory Visit (INDEPENDENT_AMBULATORY_CARE_PROVIDER_SITE_OTHER): Payer: Medicaid Other | Admitting: Orthopedic Surgery

## 2022-10-29 DIAGNOSIS — S71002D Unspecified open wound, left hip, subsequent encounter: Secondary | ICD-10-CM | POA: Diagnosis not present

## 2022-10-29 DIAGNOSIS — S71002S Unspecified open wound, left hip, sequela: Secondary | ICD-10-CM

## 2022-10-30 ENCOUNTER — Encounter: Payer: Self-pay | Admitting: Orthopedic Surgery
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
## 2022-11-06 DIAGNOSIS — G8220 Paraplegia, unspecified: Secondary | ICD-10-CM | POA: Diagnosis not present

## 2022-11-06 DIAGNOSIS — R3981 Functional urinary incontinence: Secondary | ICD-10-CM | POA: Diagnosis not present

## 2022-11-11 DIAGNOSIS — R32 Unspecified urinary incontinence: Secondary | ICD-10-CM | POA: Diagnosis not present

## 2022-11-11 DIAGNOSIS — Z419 Encounter for procedure for purposes other than remedying health state, unspecified: Secondary | ICD-10-CM | POA: Diagnosis not present

## 2022-11-15 DIAGNOSIS — T8189XA Other complications of procedures, not elsewhere classified, initial encounter: Secondary | ICD-10-CM | POA: Diagnosis not present

## 2022-12-03 ENCOUNTER — Ambulatory Visit (INDEPENDENT_AMBULATORY_CARE_PROVIDER_SITE_OTHER): Payer: Medicaid Other | Admitting: Orthopedic Surgery

## 2022-12-03 ENCOUNTER — Encounter: Payer: Self-pay | Admitting: Orthopedic Surgery

## 2022-12-03 DIAGNOSIS — Z5189 Encounter for other specified aftercare: Secondary | ICD-10-CM | POA: Diagnosis not present

## 2022-12-03 DIAGNOSIS — S71002S Unspecified open wound, left hip, sequela: Secondary | ICD-10-CM | POA: Diagnosis not present
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
## 2022-12-06 DIAGNOSIS — G8220 Paraplegia, unspecified: Secondary | ICD-10-CM | POA: Diagnosis not present

## 2022-12-06 DIAGNOSIS — R3981 Functional urinary incontinence: Secondary | ICD-10-CM | POA: Diagnosis not present

## 2022-12-11 DIAGNOSIS — R32 Unspecified urinary incontinence: Secondary | ICD-10-CM | POA: Diagnosis not present

## 2022-12-11 DIAGNOSIS — Z419 Encounter for procedure for purposes other than remedying health state, unspecified: Secondary | ICD-10-CM | POA: Diagnosis not present

## 2022-12-15 DIAGNOSIS — T8189XA Other complications of procedures, not elsewhere classified, initial encounter: Secondary | ICD-10-CM | POA: Diagnosis not present

## 2022-12-31 ENCOUNTER — Ambulatory Visit: Payer: Medicaid Other | Admitting: Orthopedic Surgery

## 2022-12-31 DIAGNOSIS — S71002S Unspecified open wound, left hip, sequela: Secondary | ICD-10-CM | POA: Diagnosis not present

## 2022-12-31 DIAGNOSIS — G8220 Paraplegia, unspecified: Secondary | ICD-10-CM | POA: Diagnosis not present

## 2022-12-31 DIAGNOSIS — S71002D Unspecified open wound, left hip, subsequent encounter: Secondary | ICD-10-CM | POA: Diagnosis not present

## 2022-12-31 DIAGNOSIS — Z4801 Encounter for change or removal of surgical wound dressing: Secondary | ICD-10-CM | POA: Diagnosis not present

## 2023-01-01 ENCOUNTER — Encounter: Payer: Self-pay | Admitting: Orthopedic Surgery
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
## 2023-01-11 DIAGNOSIS — R32 Unspecified urinary incontinence: Secondary | ICD-10-CM | POA: Diagnosis not present

## 2023-01-11 DIAGNOSIS — Z419 Encounter for procedure for purposes other than remedying health state, unspecified: Secondary | ICD-10-CM | POA: Diagnosis not present

## 2023-01-14 DIAGNOSIS — G8220 Paraplegia, unspecified: Secondary | ICD-10-CM | POA: Diagnosis not present

## 2023-01-14 DIAGNOSIS — T8189XA Other complications of procedures, not elsewhere classified, initial encounter: Secondary | ICD-10-CM | POA: Diagnosis not present

## 2023-01-14 DIAGNOSIS — R3981 Functional urinary incontinence: Secondary | ICD-10-CM | POA: Diagnosis not present

## 2023-02-10 DIAGNOSIS — R32 Unspecified urinary incontinence: Secondary | ICD-10-CM | POA: Diagnosis not present

## 2023-02-10 DIAGNOSIS — Z419 Encounter for procedure for purposes other than remedying health state, unspecified: Secondary | ICD-10-CM | POA: Diagnosis not present

## 2023-02-11 ENCOUNTER — Ambulatory Visit: Payer: Medicaid Other | Admitting: Orthopedic Surgery

## 2023-02-13 DIAGNOSIS — G8220 Paraplegia, unspecified: Secondary | ICD-10-CM | POA: Diagnosis not present

## 2023-02-13 DIAGNOSIS — T8189XA Other complications of procedures, not elsewhere classified, initial encounter: Secondary | ICD-10-CM | POA: Diagnosis not present

## 2023-02-13 DIAGNOSIS — R3981 Functional urinary incontinence: Secondary | ICD-10-CM | POA: Diagnosis not present

## 2023-02-15 ENCOUNTER — Telehealth: Payer: Self-pay | Admitting: Orthopedic Surgery

## 2023-02-15 NOTE — Telephone Encounter (Signed)
Patient's mother Lee Reilly called asked how long should she continue to care for the wound on patient's left  upper thigh? The number to contact Lee Reilly is  [phone]

## 2023-02-15 NOTE — Telephone Encounter (Signed)
I called and sw pt's mother and advised that she should continue until the wound is healed. Advised we are always happy to see him in the office and to call if they have any questions or concerns.

## 2023-03-13 DIAGNOSIS — Z419 Encounter for procedure for purposes other than remedying health state, unspecified: Secondary | ICD-10-CM | POA: Diagnosis not present

## 2023-03-13 DIAGNOSIS — R32 Unspecified urinary incontinence: Secondary | ICD-10-CM | POA: Diagnosis not present

## 2023-03-15 DIAGNOSIS — T8189XA Other complications of procedures, not elsewhere classified, initial encounter: Secondary | ICD-10-CM | POA: Diagnosis not present

## 2023-03-15 DIAGNOSIS — R3981 Functional urinary incontinence: Secondary | ICD-10-CM | POA: Diagnosis not present

## 2023-03-15 DIAGNOSIS — G8220 Paraplegia, unspecified: Secondary | ICD-10-CM | POA: Diagnosis not present

## 2023-03-20 DIAGNOSIS — D649 Anemia, unspecified: Secondary | ICD-10-CM | POA: Diagnosis not present

## 2023-03-20 DIAGNOSIS — R509 Fever, unspecified: Secondary | ICD-10-CM | POA: Diagnosis not present

## 2023-03-20 DIAGNOSIS — N319 Neuromuscular dysfunction of bladder, unspecified: Secondary | ICD-10-CM | POA: Diagnosis not present

## 2023-03-20 DIAGNOSIS — Z993 Dependence on wheelchair: Secondary | ICD-10-CM | POA: Diagnosis not present

## 2023-03-20 DIAGNOSIS — J4520 Mild intermittent asthma, uncomplicated: Secondary | ICD-10-CM | POA: Diagnosis not present

## 2023-03-20 DIAGNOSIS — G8220 Paraplegia, unspecified: Secondary | ICD-10-CM | POA: Diagnosis not present

## 2023-03-20 DIAGNOSIS — Z789 Other specified health status: Secondary | ICD-10-CM | POA: Diagnosis not present

## 2023-04-02 ENCOUNTER — Telehealth: Payer: Self-pay | Admitting: Orthopedic Surgery

## 2023-04-02 NOTE — Telephone Encounter (Signed)
Pt's mother Sharlett Iles called requesting a call from Autumn F about pt's wound. She states she has some medical questions. Please call pt at [phone].

## 2023-04-03 ENCOUNTER — Encounter: Payer: Self-pay | Admitting: Family

## 2023-04-03 ENCOUNTER — Ambulatory Visit (INDEPENDENT_AMBULATORY_CARE_PROVIDER_SITE_OTHER): Payer: Medicaid Other | Admitting: Family

## 2023-04-03 DIAGNOSIS — M869 Osteomyelitis, unspecified: Secondary | ICD-10-CM | POA: Diagnosis not present

## 2023-04-03 DIAGNOSIS — S71002S Unspecified open wound, left hip, sequela: Secondary | ICD-10-CM | POA: Diagnosis not present

## 2023-04-03 DIAGNOSIS — S71002D Unspecified open wound, left hip, subsequent encounter: Secondary | ICD-10-CM

## 2023-04-03 DIAGNOSIS — G8220 Paraplegia, unspecified: Secondary | ICD-10-CM | POA: Diagnosis not present

## 2023-04-03 NOTE — Patient Instructions (Signed)
Cleanse the wound with Vashe soaked gauze.  Then apply Vashe soaked gauze covered with dry gauze and tape.  Vashe to dry dressing changes daily.

## 2023-04-03 NOTE — Telephone Encounter (Signed)
I called and sw pt and his mother pt reports new wound near the surgical incision treating with neosporin and wanted to know if he needed to do anything else. He does state that he is having bleeding from the wound. Appt sch for Erin today to eval and make sure ok.
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
## 2023-04-13 DIAGNOSIS — R32 Unspecified urinary incontinence: Secondary | ICD-10-CM | POA: Diagnosis not present

## 2023-04-13 DIAGNOSIS — Z419 Encounter for procedure for purposes other than remedying health state, unspecified: Secondary | ICD-10-CM | POA: Diagnosis not present

## 2023-04-15 ENCOUNTER — Telehealth: Payer: Self-pay | Admitting: Orthopedic Surgery

## 2023-04-15 NOTE — Telephone Encounter (Signed)
Patient called. Would like Autumn to call him.

## 2023-04-16 ENCOUNTER — Other Ambulatory Visit: Payer: Self-pay

## 2023-04-16 ENCOUNTER — Telehealth: Payer: Self-pay | Admitting: Orthopedic Surgery

## 2023-04-16 DIAGNOSIS — S71002S Unspecified open wound, left hip, sequela: Secondary | ICD-10-CM

## 2023-04-16 NOTE — Telephone Encounter (Signed)
 This has been done.

## 2023-04-16 NOTE — Telephone Encounter (Addendum)
 Patient wanted Dr. Harden to know that he is open to the option of plastic surgery. Patient asked if Dr. Harden could refer him to a plastic surgeon so that he could schedule a consult to discuss having plastic surgery on his left upper thigh?  The number to contact patient is [phone]

## 2023-04-16 NOTE — Telephone Encounter (Signed)
I called and sw pt and advised that Dr. Lajoyce Corners said that we could make a referral to Dr. Ulice Bold. This was entered in the chart pt will call with any questions.

## 2023-04-17 DIAGNOSIS — R3981 Functional urinary incontinence: Secondary | ICD-10-CM | POA: Diagnosis not present

## 2023-04-17 DIAGNOSIS — G8220 Paraplegia, unspecified: Secondary | ICD-10-CM | POA: Diagnosis not present

## 2023-04-22 DIAGNOSIS — T8189XA Other complications of procedures, not elsewhere classified, initial encounter: Secondary | ICD-10-CM | POA: Diagnosis not present

## 2023-04-23 ENCOUNTER — Ambulatory Visit (INDEPENDENT_AMBULATORY_CARE_PROVIDER_SITE_OTHER): Payer: Medicaid Other | Admitting: Orthopedic Surgery

## 2023-04-23 DIAGNOSIS — S71002S Unspecified open wound, left hip, sequela: Secondary | ICD-10-CM | POA: Diagnosis not present

## 2023-04-23 DIAGNOSIS — S32040A Wedge compression fracture of fourth lumbar vertebra, initial encounter for closed fracture: Secondary | ICD-10-CM

## 2023-04-25 DIAGNOSIS — H9201 Otalgia, right ear: Secondary | ICD-10-CM | POA: Diagnosis not present

## 2023-04-26 ENCOUNTER — Encounter: Payer: Self-pay | Admitting: Orthopedic Surgery
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
## 2023-05-11 DIAGNOSIS — Z419 Encounter for procedure for purposes other than remedying health state, unspecified: Secondary | ICD-10-CM | POA: Diagnosis not present

## 2023-05-14 DIAGNOSIS — R32 Unspecified urinary incontinence: Secondary | ICD-10-CM | POA: Diagnosis not present

## 2023-05-21 ENCOUNTER — Ambulatory Visit: Payer: Medicaid Other | Admitting: Orthopedic Surgery

## 2023-05-21 DIAGNOSIS — G8220 Paraplegia, unspecified: Secondary | ICD-10-CM | POA: Diagnosis not present

## 2023-05-21 DIAGNOSIS — R3981 Functional urinary incontinence: Secondary | ICD-10-CM | POA: Diagnosis not present

## 2023-05-22 ENCOUNTER — Ambulatory Visit (INDEPENDENT_AMBULATORY_CARE_PROVIDER_SITE_OTHER): Admitting: Family

## 2023-05-22 DIAGNOSIS — T8189XA Other complications of procedures, not elsewhere classified, initial encounter: Secondary | ICD-10-CM | POA: Diagnosis not present

## 2023-05-22 DIAGNOSIS — S7002XD Contusion of left hip, subsequent encounter: Secondary | ICD-10-CM | POA: Diagnosis not present

## 2023-05-22 DIAGNOSIS — S71002S Unspecified open wound, left hip, sequela: Secondary | ICD-10-CM

## 2023-05-22 DIAGNOSIS — S71002D Unspecified open wound, left hip, subsequent encounter: Secondary | ICD-10-CM
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
## 2023-05-23 ENCOUNTER — Encounter: Payer: Self-pay | Admitting: Family

## 2023-05-27 ENCOUNTER — Encounter: Payer: Self-pay | Admitting: Plastic Surgery

## 2023-05-27 ENCOUNTER — Ambulatory Visit: Payer: Medicaid Other | Admitting: Plastic Surgery

## 2023-05-27 VITALS — BP 102/56 | HR 105

## 2023-05-27 DIAGNOSIS — L89229 Pressure ulcer of left hip, unspecified stage: Secondary | ICD-10-CM | POA: Diagnosis not present

## 2023-05-27 DIAGNOSIS — G8220 Paraplegia, unspecified: Secondary | ICD-10-CM

## 2023-05-27 DIAGNOSIS — L89220 Pressure ulcer of left hip, unstageable: Secondary | ICD-10-CM
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
## 2023-05-31 ENCOUNTER — Telehealth: Payer: Self-pay | Admitting: Plastic Surgery

## 2023-05-31 NOTE — Telephone Encounter (Signed)
 Patient came for surgery referral he called to find out surgery date also need to add 2nd insurance [facility] [id number] ID [id number] provider phone number [phone]

## 2023-06-05 ENCOUNTER — Telehealth: Payer: Self-pay | Admitting: Orthopedic Surgery

## 2023-06-05 NOTE — Telephone Encounter (Signed)
 Pt's mom states Lee Reilly has a jury duty sumons for May 22,2025 and he is requesting a letter to excuse him due to his health condition.

## 2023-06-10 ENCOUNTER — Telehealth: Payer: Self-pay | Admitting: Orthopedic Surgery

## 2023-06-10 ENCOUNTER — Other Ambulatory Visit: Payer: Self-pay

## 2023-06-10 NOTE — Telephone Encounter (Signed)
 Pt's mother called requesting a letter to excuse pt from jury duty. She is asking to pick letter up. Please call [name]. Phone number is [phone].

## 2023-06-10 NOTE — Telephone Encounter (Signed)
 This is a duplicate message. I will call pt's mother to advise letter is at the front desk for pick up.

## 2023-06-10 NOTE — Telephone Encounter (Signed)
 Pt's mother is aware that letter is ready for pick up.

## 2023-06-14 DIAGNOSIS — R32 Unspecified urinary incontinence: Secondary | ICD-10-CM | POA: Diagnosis not present

## 2023-06-17 ENCOUNTER — Encounter: Admitting: Physician Assistant
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
## 2023-06-19 ENCOUNTER — Ambulatory Visit: Admitting: Physician Assistant

## 2023-06-19 VITALS — BP 96/52 | HR 111 | Ht 63.0 in | Wt 120.0 lb

## 2023-06-19 DIAGNOSIS — L89220 Pressure ulcer of left hip, unstageable: Secondary | ICD-10-CM

## 2023-06-19 MED ORDER — ONDANSETRON 4 MG PO TBDP: 4.0000 mg | ORAL_TABLET | Freq: Three times a day (TID) | ORAL | 0 refills | Status: DC | PRN

## 2023-06-21 DIAGNOSIS — R3981 Functional urinary incontinence: Secondary | ICD-10-CM | POA: Diagnosis not present

## 2023-06-21 DIAGNOSIS — T8189XA Other complications of procedures, not elsewhere classified, initial encounter: Secondary | ICD-10-CM | POA: Diagnosis not present

## 2023-06-21 DIAGNOSIS — G8220 Paraplegia, unspecified: Secondary | ICD-10-CM | POA: Diagnosis not present

## 2023-06-22 DIAGNOSIS — Z419 Encounter for procedure for purposes other than remedying health state, unspecified: Secondary | ICD-10-CM | POA: Diagnosis not present

## 2023-06-26 ENCOUNTER — Encounter (HOSPITAL_COMMUNITY): Payer: Self-pay | Admitting: Plastic Surgery

## 2023-06-26 ENCOUNTER — Other Ambulatory Visit: Payer: Self-pay

## 2023-06-26 NOTE — Progress Notes (Signed)
 SDW CALL  Patient was given pre-op instructions over the phone. The opportunity was given for the patient to ask questions. No further questions asked. Patient verbalized understanding of instructions given.   PCP - Lennard Quirk Cardiologist - Denies  PPM/ICD - Denies Device Orders - n/a Rep Notified - n/a  Chest x-ray - Denies EKG - 06-26-22 Stress Test - Denies ECHO - Denies Cardiac Cath - Denies  Sleep Study - Denies CPAP - n/a  Non-diabetic  Last dose of GLP1 agonist-  Denies GLP1 instructions: n/a  Blood Thinner Instructions:Denies Aspirin Instructions:Denies  ERAS Protcol - Clears until 1100 PRE-SURGERY Ensure or G2- none  COVID TEST- n/a   Anesthesia review: no  Patient denies shortness of breath, fever, cough and chest pain over the phone call.  Patient denies any respiratory issues at this time.    All instructions explained to the patient, with a verbal understanding of the material. Patient agrees to go over the instructions while at home for a better understanding.

## 2023-06-28 ENCOUNTER — Ambulatory Visit (HOSPITAL_COMMUNITY)

## 2023-06-28 ENCOUNTER — Encounter (HOSPITAL_COMMUNITY)
Admission: RE | Disposition: A | Discharge status: Home / Self Care | Payer: Self-pay | Source: Home / Self Care | Attending: Plastic Surgery

## 2023-06-28 ENCOUNTER — Ambulatory Visit (HOSPITAL_COMMUNITY)
Admission: RE | Admit: 2023-06-28 | Discharge: 2023-06-28 | Disposition: A | Discharge status: Home / Self Care | Attending: Plastic Surgery | Admitting: Plastic Surgery

## 2023-06-28 ENCOUNTER — Other Ambulatory Visit: Payer: Self-pay

## 2023-06-28 ENCOUNTER — Ambulatory Visit (HOSPITAL_BASED_OUTPATIENT_CLINIC_OR_DEPARTMENT_OTHER)

## 2023-06-28 ENCOUNTER — Encounter (HOSPITAL_COMMUNITY): Payer: Self-pay | Admitting: Plastic Surgery

## 2023-06-28 DIAGNOSIS — N319 Neuromuscular dysfunction of bladder, unspecified: Secondary | ICD-10-CM | POA: Insufficient documentation

## 2023-06-28 DIAGNOSIS — J45909 Unspecified asthma, uncomplicated: Secondary | ICD-10-CM | POA: Diagnosis not present

## 2023-06-28 DIAGNOSIS — L89220 Pressure ulcer of left hip, unstageable: Secondary | ICD-10-CM | POA: Diagnosis not present

## 2023-06-28 DIAGNOSIS — F17210 Nicotine dependence, cigarettes, uncomplicated: Secondary | ICD-10-CM | POA: Diagnosis not present

## 2023-06-28 DIAGNOSIS — M1990 Unspecified osteoarthritis, unspecified site: Secondary | ICD-10-CM | POA: Diagnosis not present

## 2023-06-28 DIAGNOSIS — Z79899 Other long term (current) drug therapy: Secondary | ICD-10-CM | POA: Insufficient documentation

## 2023-06-28 DIAGNOSIS — T148XXA Other injury of unspecified body region, initial encounter: Secondary | ICD-10-CM | POA: Diagnosis not present

## 2023-06-28 DIAGNOSIS — G8220 Paraplegia, unspecified: Secondary | ICD-10-CM | POA: Insufficient documentation

## 2023-06-28 DIAGNOSIS — Z87891 Personal history of nicotine dependence: Secondary | ICD-10-CM | POA: Diagnosis not present

## 2023-06-28 DIAGNOSIS — L89229 Pressure ulcer of left hip, unspecified stage: Secondary | ICD-10-CM | POA: Diagnosis not present

## 2023-06-28 DIAGNOSIS — L83 Acanthosis nigricans: Secondary | ICD-10-CM | POA: Diagnosis not present

## 2023-06-28 DIAGNOSIS — L89224 Pressure ulcer of left hip, stage 4: Secondary | ICD-10-CM | POA: Diagnosis present

## 2023-06-28 DIAGNOSIS — L905 Scar conditions and fibrosis of skin: Secondary | ICD-10-CM | POA: Diagnosis not present

## 2023-06-28 SURGERY — IRRIGATION AND DEBRIDEMENT WOUND
Anesthesia: General | Laterality: Left

## 2023-06-28 MED ORDER — DEXAMETHASONE SODIUM PHOSPHATE 10 MG/ML IJ SOLN
INTRAMUSCULAR | Status: DC | PRN
  Administered 2023-06-28: 10 mg via INTRAVENOUS

## 2023-06-28 MED ORDER — ROCURONIUM BROMIDE 10 MG/ML (PF) SYRINGE
PREFILLED_SYRINGE | INTRAVENOUS | Status: DC | PRN
  Administered 2023-06-28: 30 mg via INTRAVENOUS

## 2023-06-28 MED ORDER — MIDAZOLAM HCL 2 MG/2ML IJ SOLN
INTRAMUSCULAR | Status: AC
Start: 2023-06-28 — End: ?
  Filled 2023-06-28: qty 2

## 2023-06-28 MED ORDER — ORAL CARE MOUTH RINSE: 15.0000 mL | Freq: Once | OROMUCOSAL | Status: AC

## 2023-06-28 MED ORDER — CHLORHEXIDINE GLUCONATE 0.12 % MT SOLN
OROMUCOSAL | Status: AC
  Administered 2023-06-28: 15 mL via OROMUCOSAL
  Filled 2023-06-28: qty 15

## 2023-06-28 MED ORDER — ROCURONIUM BROMIDE 10 MG/ML (PF) SYRINGE
PREFILLED_SYRINGE | INTRAVENOUS | Status: AC
  Filled 2023-06-28: qty 10

## 2023-06-28 MED ORDER — FENTANYL CITRATE (PF) 250 MCG/5ML IJ SOLN
INTRAMUSCULAR | Status: AC
Start: 2023-06-28 — End: ?
  Filled 2023-06-28: qty 5

## 2023-06-28 MED ORDER — PROPOFOL 10 MG/ML IV BOLUS
INTRAVENOUS | Status: AC
  Filled 2023-06-28: qty 20

## 2023-06-28 MED ORDER — SUGAMMADEX SODIUM 200 MG/2ML IV SOLN
INTRAVENOUS | Status: DC | PRN
  Administered 2023-06-28: 200 mg via INTRAVENOUS

## 2023-06-28 MED ORDER — LIDOCAINE 2% (20 MG/ML) 5 ML SYRINGE
INTRAMUSCULAR | Status: DC | PRN
  Administered 2023-06-28: 60 mg via INTRAVENOUS

## 2023-06-28 MED ORDER — CHLORHEXIDINE GLUCONATE CLOTH 2 % EX PADS: 6.0000 | MEDICATED_PAD | Freq: Once | CUTANEOUS | Status: DC

## 2023-06-28 MED ORDER — CHLORHEXIDINE GLUCONATE 0.12 % MT SOLN: 15.0000 mL | Freq: Once | OROMUCOSAL | Status: AC

## 2023-06-28 MED ORDER — MIDAZOLAM HCL 2 MG/2ML IJ SOLN
INTRAMUSCULAR | Status: DC | PRN
  Administered 2023-06-28: 2 mg via INTRAVENOUS

## 2023-06-28 MED ORDER — CHLORHEXIDINE GLUCONATE CLOTH 2 % EX PADS
6.0000 | MEDICATED_PAD | Freq: Once | CUTANEOUS | Status: DC
Start: 2023-06-28 — End: 2023-06-28

## 2023-06-28 MED ORDER — EPHEDRINE SULFATE-NACL 50-0.9 MG/10ML-% IV SOSY: PREFILLED_SYRINGE | INTRAVENOUS | Status: DC | PRN

## 2023-06-28 MED ORDER — LACTATED RINGERS IV SOLN: INTRAVENOUS | Status: DC

## 2023-06-28 MED ORDER — CEFAZOLIN SODIUM-DEXTROSE 2-4 GM/100ML-% IV SOLN
2.0000 g | INTRAVENOUS | Status: AC
  Administered 2023-06-28: 2 g via INTRAVENOUS
  Filled 2023-06-28: qty 100

## 2023-06-28 MED ORDER — FENTANYL CITRATE (PF) 250 MCG/5ML IJ SOLN
INTRAMUSCULAR | Status: DC | PRN
  Administered 2023-06-28 (×3): 50 ug via INTRAVENOUS

## 2023-06-28 MED ORDER — PROPOFOL 10 MG/ML IV BOLUS
INTRAVENOUS | Status: DC | PRN
  Administered 2023-06-28 (×2): 100 mg via INTRAVENOUS

## 2023-06-28 MED ORDER — PHENYLEPHRINE 80 MCG/ML (10ML) SYRINGE FOR IV PUSH (FOR BLOOD PRESSURE SUPPORT)
PREFILLED_SYRINGE | INTRAVENOUS | Status: DC | PRN
  Administered 2023-06-28 (×4): 80 ug via INTRAVENOUS

## 2023-06-28 MED ORDER — ONDANSETRON HCL 4 MG/2ML IJ SOLN
INTRAMUSCULAR | Status: DC | PRN
  Administered 2023-06-28: 4 mg via INTRAVENOUS

## 2023-06-28 SURGICAL SUPPLY — 56 items
APPLICATOR COTTON TIP 6 STRL (MISCELLANEOUS) IMPLANT
APPLICATOR COTTON TIP 6IN STRL (MISCELLANEOUS) IMPLANT
BAG COUNTER SPONGE SURGICOUNT (BAG) ×2 IMPLANT
BAG DECANTER FOR FLEXI CONT (MISCELLANEOUS) IMPLANT
BENZOIN TINCTURE PRP APPL 2/3 (GAUZE/BANDAGES/DRESSINGS) ×2 IMPLANT
CANISTER SUCT 3000ML PPV (MISCELLANEOUS) ×2 IMPLANT
CLEANSER WND VASHE 34 (WOUND CARE) IMPLANT
CNTNR URN SCR LID CUP LEK RST (MISCELLANEOUS) IMPLANT
COVER SURGICAL LIGHT HANDLE (MISCELLANEOUS) ×2 IMPLANT
DRAIN CHANNEL 19F RND (DRAIN) IMPLANT
DRAIN JP 10F RND SILICONE (MISCELLANEOUS) IMPLANT
DRAPE HALF SHEET 40X57 (DRAPES) IMPLANT
DRAPE IMP U-DRAPE 54X76 (DRAPES) ×2 IMPLANT
DRAPE INCISE IOBAN 66X45 STRL (DRAPES) IMPLANT
DRAPE LAPAROSCOPIC ABDOMINAL (DRAPES) IMPLANT
DRAPE LAPAROTOMY 100X72 PEDS (DRAPES) ×2 IMPLANT
DRSG ADAPTIC 3X8 NADH LF (GAUZE/BANDAGES/DRESSINGS) IMPLANT
DRSG CALCIUM ALGINATE 4X4 (GAUZE/BANDAGES/DRESSINGS) IMPLANT
DRSG CUTIMED SORBACT 7X9 (GAUZE/BANDAGES/DRESSINGS) IMPLANT
DRSG VAC GRANUFOAM LG (GAUZE/BANDAGES/DRESSINGS) IMPLANT
DRSG VAC GRANUFOAM MED (GAUZE/BANDAGES/DRESSINGS) IMPLANT
DRSG VAC GRANUFOAM SM (GAUZE/BANDAGES/DRESSINGS) IMPLANT
ELECT CAUTERY BLADE 6.4 (BLADE) IMPLANT
ELECT REM PT RETURN 9FT ADLT (ELECTROSURGICAL) ×1 IMPLANT
ELECTRODE REM PT RTRN 9FT ADLT (ELECTROSURGICAL) ×2 IMPLANT
GAUZE PAD ABD 8X10 STRL (GAUZE/BANDAGES/DRESSINGS) IMPLANT
GAUZE SPONGE 4X4 12PLY STRL (GAUZE/BANDAGES/DRESSINGS) IMPLANT
GLOVE BIO SURGEON STRL SZ8 (GLOVE) ×2 IMPLANT
GLOVE BIOGEL PI IND STRL 8 (GLOVE) ×2 IMPLANT
GOWN STRL REUS W/ TWL LRG LVL3 (GOWN DISPOSABLE) ×6 IMPLANT
GOWN STRL REUS W/ TWL XL LVL3 (GOWN DISPOSABLE) ×2 IMPLANT
GRAFT MYRIAD 3 LAYER 7X10 (Graft) IMPLANT
KIT BASIN OR (CUSTOM PROCEDURE TRAY) ×2 IMPLANT
KIT TURNOVER KIT B (KITS) ×2 IMPLANT
NDL HYPO 25GX1X1/2 BEV (NEEDLE) ×2 IMPLANT
NEEDLE HYPO 25GX1X1/2 BEV (NEEDLE) ×1 IMPLANT
NS IRRIG 1000ML POUR BTL (IV SOLUTION) ×2 IMPLANT
PACK GENERAL/GYN (CUSTOM PROCEDURE TRAY) ×2 IMPLANT
PACK UNIVERSAL I (CUSTOM PROCEDURE TRAY) ×2 IMPLANT
PAD ABD 8X10 STRL (GAUZE/BANDAGES/DRESSINGS) IMPLANT
PAD ARMBOARD POSITIONER FOAM (MISCELLANEOUS) ×4 IMPLANT
POWDER MYRIAD MORCELLS 1000MG (Miscellaneous) IMPLANT
STAPLER VISISTAT 35W (STAPLE) ×2 IMPLANT
SURGILUBE 2OZ TUBE FLIPTOP (MISCELLANEOUS) IMPLANT
SUT MNCRL AB 3-0 PS2 27 (SUTURE) IMPLANT
SUT MNCRL AB 4-0 PS2 18 (SUTURE) IMPLANT
SUT MON AB 2-0 CT1 36 (SUTURE) IMPLANT
SUT MON AB 5-0 PS2 18 (SUTURE) IMPLANT
SUT PROLENE 4 0 PS 2 18 (SUTURE) IMPLANT
SUT VIC AB 5-0 PS2 18 (SUTURE) IMPLANT
SUT VICRYL 3 0 (SUTURE) IMPLANT
SWAB COLLECTION DEVICE MRSA (MISCELLANEOUS) IMPLANT
SWAB CULTURE ESWAB REG 1ML (MISCELLANEOUS) IMPLANT
SYR CONTROL 10ML LL (SYRINGE) ×2 IMPLANT
TOWEL GREEN STERILE (TOWEL DISPOSABLE) ×2 IMPLANT
UNDERPAD 30X36 HEAVY ABSORB (UNDERPADS AND DIAPERS) ×2 IMPLANT

## 2023-06-28 NOTE — Anesthesia Procedure Notes (Signed)
 Procedure Name: Intubation Date/Time: 06/28/2023 1:49 PM  Performed by: [address], Gawain Crombie A, CRNAPre-anesthesia Checklist: Suction available, Emergency Drugs available, Patient identified, Patient being monitored and Timeout performed Patient Re-evaluated:Patient Re-evaluated prior to induction Oxygen Delivery Method: Circle system utilized Preoxygenation: Pre-oxygenation with 100% oxygen Induction Type: IV induction Laryngoscope Size: Glidescope Grade View: Grade II Tube size: 7.0 mm Number of attempts: 1 Placement Confirmation: ETT inserted through vocal cords under direct vision, positive ETCO2, CO2 detector and breath sounds checked- equal and bilateral Secured at: 22 cm Tube secured with: Tape

## 2023-06-28 NOTE — Discharge Instructions (Addendum)
 Diet: High protein, low sugar.  Medications: Please take the Zofran  as needed for nausea symptoms.   Wound care: Your wound was debrided and cleaned. Afterwards, Myriad morsels and sheet was placed to help encourage healing. This was covered with a green Sorbact dressing that was secured to the skin with sutures.  This was dressed liberally with KY jelly.  It is important that it remains moist. This was covered with 4 x 4 gauze and then an ABD pad which was secured with tape.  Please do NOT sleep or lie on your left side. It is important to offload this area.    Dressing items to purchase: Box of 4 x 4 inch gauze. ABD (abdominal) pads or similar thick absorbent pad.  KY jelly or Surgilube. Medipore tape.  Starting 06/29/2023: Remove dressing down to the green Sorbact which is secured with stitches. Apply a liberal amount of KY jelly. Cover with moist 4 x 4 gauze. Cover with ABD pad. Secure with a skin-sensitive tape such as Medipore tape.  Activity: As tolerated.  Mild drainage from underneath the dressing is OK. However, please call the office should you have severe pain or swelling, surrounding redness or malodor, wound dehiscence (if it completely opens), or if you have any fevers.

## 2023-06-28 NOTE — Transfer of Care (Signed)
 Immediate Anesthesia Transfer of Care Note  Patient: Lee Reilly  Procedure(s) Performed: IRRIGATION AND DEBRIDEMENT LEFT HIP WOUND (Left)  Patient Location: PACU  Anesthesia Type:General  Level of Consciousness: awake  Airway & Oxygen Therapy: Patient Spontanous Breathing  Post-op Assessment: Report given to RN  Post vital signs: Reviewed  Last Vitals:  Vitals Value Taken Time  BP 89/63 06/28/23 1445  Temp 36.5 06/28/23   1448  Pulse 88 06/28/23 1448  Resp 13 06/28/23 1448  SpO2 98 % 06/28/23 1448  Vitals shown include unfiled device data.  Last Pain:  Vitals:   06/28/23 1244  TempSrc:   PainSc: 0-No pain         Complications: No notable events documented.

## 2023-06-28 NOTE — Op Note (Signed)
 DATE OF OPERATION: 06/28/2023  LOCATION: [facility]  PREOPERATIVE DIAGNOSIS: Left trochanteric pressure ulcer  POSTOPERATIVE DIAGNOSIS: Same  PROCEDURE: Debridement of left trochanteric pressure ulcer and placement of myriad tissue matrix.  Biopsy of left hip skin lesion  SURGEON: Kurtis Philips, MD  ASSISTANT: Mariel Shope  EBL: 10 cc  CONDITION: Stable  COMPLICATIONS: None  INDICATION: The patient, Lee Reilly, is a 38 y.o. male born on 05-Jul-1985, is here for treatment of a pressure ulcer over the left trochanter.  Patient is a paraplegic and developed the wound several years ago.  It is not healed and he is here for debridement and placement of tissue repair matrix  PROCEDURE DETAILS:  The patient was seen prior to surgery and marked.  The IV antibiotics were given. The patient was taken to the operating room and given a general anesthetic. A standard time out was performed and all information was confirmed by those in the room.    The patient was placed with the left hip exposed.  The wound was inspected and I noted that there was a moderate amount of necrotic tissue at the base of the wound.  I also noted a small skin lesion at the inferior border of the wound.  The skin lesion had an irregular cobblestone appearance to it and I sharply removed a portion of the lesion 5 x 5 mm.  This was sent to pathology for routine examination.  An area 3 cm deep 3 cm wide and 3 cm in length was debrided sharply with scissors.  Tissue removed was subcutaneous tissue.  No skin muscle or bone was removed.  After completely cleaning the wound I irrigated it with Vashe.  The entire size of the wound was 8 cm x 5 cm including the 3 x 3 x 3 cm area that was debrided.  The deep portion of the wound was packed with 1000 mg of morcellated myriad and the remainder of the wound was covered with a 10 x 7 cm myriad matrix.  The matrix was sewn in place with interrupted 4-0 Monocryl sutures.  This was  then covered with a portion of Sorbact which was sewn in place with 4-0 Prolene sutures.  A dressing consisting of water-based lubricant covered with Vashe moistened gauze and held in place with an ABD pad replaced.  The patient was then awakened from anesthesia without incident transferred to the recovery room in good condition.  All instrument needle and sponge counts were reported as correct and no complications were appreciated during the procedure. The patient was allowed to wake up and taken to recovery room in stable condition at the end of the case. The family was notified at the end of the case.   The advanced practice practitioner (APP) assisted throughout the case.  The APP was essential in retraction and counter traction when needed to make the case progress smoothly.  This retraction and assistance made it possible to see the tissue plans for the procedure.  The assistance was needed for blood control, tissue re-approximation and assisted with closure of the incision site.

## 2023-06-28 NOTE — Anesthesia Postprocedure Evaluation (Signed)
 Anesthesia Post Note  Patient: Lee Reilly  Procedure(s) Performed: IRRIGATION AND DEBRIDEMENT LEFT HIP WOUND (Left)     Patient location during evaluation: PACU Anesthesia Type: General Level of consciousness: awake and alert Pain management: pain level controlled Vital Signs Assessment: post-procedure vital signs reviewed and stable Respiratory status: spontaneous breathing, nonlabored ventilation and respiratory function stable Cardiovascular status: stable and blood pressure returned to baseline Anesthetic complications: no  No notable events documented.  Last Vitals:  Vitals:   06/28/23 1500 06/28/23 1515  BP: 93/60 (!) 99/58  Pulse: 81 89  Resp: 13 17  Temp:  36.4 C  SpO2: 100% 100%    Last Pain:  Vitals:   06/28/23 1515  TempSrc:   PainSc: 0-No pain                 Debby FORBES Like

## 2023-06-28 NOTE — Anesthesia Preprocedure Evaluation (Addendum)
 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   negative psych ROS   GI/Hepatic negative GI ROS,,,(+)     substance abuse  marijuana use  Endo/Other  negative endocrine ROS    Renal/GU negative Renal ROS    Neurogenic bladder      Musculoskeletal  (+) Arthritis ,    Abdominal   Peds  Hematology negative hematology ROS (+)   Anesthesia Other Findings   Reproductive/Obstetrics                             Anesthesia Physical Anesthesia Plan  ASA: 3  Anesthesia Plan: General   Post-op Pain Management: Minimal or no pain anticipated   Induction: Intravenous  PONV Risk Score and Plan: 1 and Treatment may vary due to age or medical condition, Ondansetron , Dexamethasone  and Midazolam   Airway Management Planned: LMA  Additional Equipment: None  Intra-op Plan:   Post-operative Plan: Extubation in OR  Informed Consent: I have reviewed the patients History and Physical, chart, labs and discussed the procedure including the risks, benefits and alternatives for the proposed anesthesia with the patient or authorized representative who has indicated his/her understanding and acceptance.     Dental advisory given  Plan Discussed with: CRNA and Anesthesiologist  Anesthesia Plan Comments:         Anesthesia Quick Evaluation

## 2023-06-28 NOTE — Interval H&P Note (Signed)
 History and Physical Interval Note: No change in exam or indication for surgery All questions answered. Left side marked Will proceed with debridement and placement of myriad at his request  06/28/2023 1:06 PM  Lee Reilly  has presented today for surgery, with the diagnosis of Wound healing, delayed.  The various methods of treatment have been discussed with the patient and family. After consideration of risks, benefits and other options for treatment, the patient has consented to  Procedure(s): IRRIGATION AND DEBRIDEMENT WOUND (Left) as a surgical intervention.  The patient's history has been reviewed, patient examined, no change in status, stable for surgery.  I have reviewed the patient's chart and labs.  Questions were answered to the patient's satisfaction.     Teretha Ferguson

## 2023-07-01 ENCOUNTER — Encounter (HOSPITAL_COMMUNITY): Payer: Self-pay | Admitting: Plastic Surgery

## 2023-07-02 LAB — SURGICAL PATHOLOGY

## 2023-07-03 ENCOUNTER — Telehealth: Payer: Self-pay | Admitting: Plastic Surgery

## 2023-07-03 NOTE — Telephone Encounter (Signed)
 Spoke with patient. Discussed continued sponge bathing until in-person assessment tomorrow. We also discussed off-loading that left hip as much as possible and sleeping on the right side (if manageable) throughout his postoperative recovery.

## 2023-07-03 NOTE — Telephone Encounter (Signed)
 Patients mother called to follow up on surgery patient had this past Friday and said that he hasn't showered since because he's afraid to get the location wet. He has an appointment with Dr Carolynne Citron tomorrow and she wanted to know if its okay for him to shower or get the location wet. Please advise. (Patient)[phone] or (Patients mom)[phone].

## 2023-07-04 ENCOUNTER — Ambulatory Visit: Admitting: Plastic Surgery

## 2023-07-04 ENCOUNTER — Encounter: Payer: Self-pay | Admitting: Plastic Surgery

## 2023-07-04 VITALS — BP 107/71 | HR 119

## 2023-07-04 DIAGNOSIS — L89220 Pressure ulcer of left hip, unstageable: Secondary | ICD-10-CM

## 2023-07-04 NOTE — Progress Notes (Signed)
 Mr. Muscatello is postop day 6 from debridement of his left hip wound with placement of myriad tissue matrix.  His mother states that the dressing changes are going well.  Mr. Backlund states that he is doing well with no significant issues.  On examination the Sorbact has partially detached.  The myriad appears to be incorporating however the morsels that were placed deep in the wound have since fallen out.  The wound overall though is clean.  Discussed the dressing changes with Mr. Meditz mother the moistened gauze will be packed deeper into the wound over the next week and he will return next week for removal removal of the sore back and 3 inspection of the wound.  I suspect that he will need another application of the myriad in the future.  He may shower as desired and the wound may get wet.  Follow-up next week

## 2023-07-15 DIAGNOSIS — R32 Unspecified urinary incontinence: Secondary | ICD-10-CM | POA: Diagnosis not present

## 2023-07-18 NOTE — Progress Notes (Signed)
 Patient is a pleasant 38 year old male with history of paraplegia and nonhealing left hip wound s/p debridement with placement of myriad performed 06/28/2023 by Dr. Carolynne Citron who presents to clinic for postoperative follow-up.    He was seen for initial postoperative encounter 07/04/2023.  At that time, Sorbact had partially detached.  Wound overall appeared clean, but suspected that he would need another application of the myriad in the future.  He may shower normally.  Today, patient is doing okay.  He is accompanied by mother at bedside.  She states that she has been applying K-Y jelly over the cream Sorbact followed by ABD pad secured with Medipore tape.  She states that she has been cleaning the area 2 with Vashe soaked gauze.  He continues to sleep on the left side, but he does use a pillow to help offload pressure from the wound.  On exam, wound measures 5 x 3 cm, 4 cm depth.  There is some slight malodor appreciated, likely due to the slough noted within the wound.  No significant unincorporated myriad appreciated.  No active drainage on examination.  Surrounding skin and tissue is without any erythema or induration.  No crepitus.  Sorbact was only secured with 1-2 stitches and was removed in its entirety without complication.  Given the cavity of the wound, recommending that mother perform twice daily wet-to-dry dressing changes packing the wound with Vashe soaked gauze.  She understands to open the gauze up completely before packing so that a tail piece is exposed and easy to remove.  She understands the risk of retained gauze packing.  This can be covered with ABD pad and resecured with Medipore tape or undergarments.  The slight malodor is likely attributable to the slough and should improve as it melts away.  Recommend follow-up in 2 to 3 weeks for reevaluation.  They understand that if there is no considerable granulation or filling up of the cavity, may need to consider change in therapy or  additional surgical intervention.  Discussed possible wound VAC, but they were not interested.  Patient or mother to call should they develop any new or worsening symptoms in interim.  Picture(s) obtained of the patient and placed in the chart were with the patient's or guardian's permission.

## 2023-07-22 ENCOUNTER — Ambulatory Visit (INDEPENDENT_AMBULATORY_CARE_PROVIDER_SITE_OTHER): Admitting: Physician Assistant

## 2023-07-22 VITALS — BP 94/66 | HR 122

## 2023-07-22 DIAGNOSIS — Z419 Encounter for procedure for purposes other than remedying health state, unspecified: Secondary | ICD-10-CM | POA: Diagnosis not present

## 2023-07-22 DIAGNOSIS — G8220 Paraplegia, unspecified: Secondary | ICD-10-CM | POA: Diagnosis not present

## 2023-07-22 DIAGNOSIS — L89220 Pressure ulcer of left hip, unstageable: Secondary | ICD-10-CM

## 2023-07-22 DIAGNOSIS — R3981 Functional urinary incontinence: Secondary | ICD-10-CM | POA: Diagnosis not present

## 2023-07-23 ENCOUNTER — Telehealth: Payer: Self-pay | Admitting: Plastic Surgery

## 2023-07-23 NOTE — Telephone Encounter (Signed)
 Patient would like to know about showering

## 2023-07-29 ENCOUNTER — Telehealth: Payer: Self-pay | Admitting: Surgical

## 2023-07-29 NOTE — Telephone Encounter (Signed)
 Called 07-29-23 to r/s Jolan Natal out of the office in sx

## 2023-08-07 ENCOUNTER — Encounter: Admitting: Surgical

## 2023-08-08 ENCOUNTER — Encounter: Admitting: Surgical

## 2023-08-12 ENCOUNTER — Ambulatory Visit (INDEPENDENT_AMBULATORY_CARE_PROVIDER_SITE_OTHER): Admitting: Surgical

## 2023-08-12 DIAGNOSIS — L89220 Pressure ulcer of left hip, unstageable: Secondary | ICD-10-CM

## 2023-08-12 NOTE — Progress Notes (Signed)
   Referring Provider Tova Fresh, PA-C [address] [place],  [place] [address]   CC:  Chief Complaint  Patient presents with   Post-op Follow-up      Lee Reilly is an 38 y.o. male.  HPI: Patient is a 38 year old male who presents with his mother for follow-up on a nonhealing left hip wound.  He has a history of paraplegia.  He did undergo debridement with placement myriad on 06/28/2023 with Dr. Carolynne Citron.  They have been doing Vashe soaked gauze packing 2 times daily.  He has been tolerating this well, mom has noticed an improvement in the wound.  They report that he has not been having any infectious symptoms.  They have noticed some bleeding with dressing changes.  Review of Systems General: No fevers or chills  Physical Exam    07/22/2023   10:58 AM 07/04/2023   10:49 AM 06/28/2023    3:15 PM  Vitals with BMI  Height  --   Weight  --   Systolic 94 107 99  Diastolic 66 71 58  Pulse 122 119 89    General:  No acute distress,  Alert and oriented, Non-Toxic, Normal speech and affect Left hip wound with healthy base of granulation tissue noted throughout.  It does track inferiorly and superiorly.  There is new epithelialization noted.  There is no foul odors.  No active drainage noted.  No surrounding crepitus is noted.  No myriad noted.    Assessment/Plan Recommend continuing with Vashe soaked gauze packing 2 times daily.  Cover with dry gauze, ABD pad and secure with Medipore tape.  There is no signs infection or concern on exam.  The cavity seems to be improving, there is no nonviable tissue present.  Recommend following up in about 3 weeks for reevaluation.  Recommend calling with questions or concerns.  Dressing was changed today.  Patient tolerated this well.    Janalyn Me Doyne Micke 08/12/2023, 10:10 AM

## 2023-08-15 DIAGNOSIS — R32 Unspecified urinary incontinence: Secondary | ICD-10-CM | POA: Diagnosis not present

## 2023-08-21 DIAGNOSIS — G8220 Paraplegia, unspecified: Secondary | ICD-10-CM | POA: Diagnosis not present

## 2023-08-21 DIAGNOSIS — R3981 Functional urinary incontinence: Secondary | ICD-10-CM | POA: Diagnosis not present

## 2023-08-22 DIAGNOSIS — Z419 Encounter for procedure for purposes other than remedying health state, unspecified: Secondary | ICD-10-CM | POA: Diagnosis not present

## 2023-08-30 ENCOUNTER — Emergency Department (HOSPITAL_COMMUNITY)
Admission: EM | Admit: 2023-08-30 | Discharge: 2023-08-30 | Disposition: A | Discharge status: Against Medical Advice | Attending: Emergency Medicine | Admitting: Emergency Medicine

## 2023-08-30 ENCOUNTER — Encounter (HOSPITAL_COMMUNITY): Payer: Self-pay

## 2023-08-30 ENCOUNTER — Emergency Department (HOSPITAL_COMMUNITY)

## 2023-08-30 ENCOUNTER — Other Ambulatory Visit: Payer: Self-pay

## 2023-08-30 DIAGNOSIS — L89220 Pressure ulcer of left hip, unstageable: Secondary | ICD-10-CM | POA: Diagnosis not present

## 2023-08-30 DIAGNOSIS — Z5329 Procedure and treatment not carried out because of patient's decision for other reasons: Secondary | ICD-10-CM | POA: Diagnosis not present

## 2023-08-30 DIAGNOSIS — R1011 Right upper quadrant pain: Secondary | ICD-10-CM | POA: Diagnosis present

## 2023-08-30 DIAGNOSIS — N3001 Acute cystitis with hematuria: Secondary | ICD-10-CM | POA: Diagnosis not present

## 2023-08-30 DIAGNOSIS — N132 Hydronephrosis with renal and ureteral calculous obstruction: Secondary | ICD-10-CM | POA: Diagnosis not present

## 2023-08-30 DIAGNOSIS — E871 Hypo-osmolality and hyponatremia: Secondary | ICD-10-CM | POA: Diagnosis not present

## 2023-08-30 DIAGNOSIS — R000 Tachycardia, unspecified: Secondary | ICD-10-CM

## 2023-08-30 DIAGNOSIS — N200 Calculus of kidney: Secondary | ICD-10-CM

## 2023-08-30 DIAGNOSIS — G8220 Paraplegia, unspecified: Secondary | ICD-10-CM | POA: Diagnosis not present

## 2023-08-30 DIAGNOSIS — N492 Inflammatory disorders of scrotum: Secondary | ICD-10-CM | POA: Diagnosis not present

## 2023-08-30 DIAGNOSIS — D72829 Elevated white blood cell count, unspecified: Secondary | ICD-10-CM | POA: Diagnosis not present

## 2023-08-30 DIAGNOSIS — R1900 Intra-abdominal and pelvic swelling, mass and lump, unspecified site: Secondary | ICD-10-CM | POA: Diagnosis not present

## 2023-08-30 DIAGNOSIS — N5089 Other specified disorders of the male genital organs: Secondary | ICD-10-CM | POA: Diagnosis not present

## 2023-08-30 DIAGNOSIS — R1013 Epigastric pain: Secondary | ICD-10-CM | POA: Diagnosis not present

## 2023-08-30 LAB — URINALYSIS, ROUTINE W REFLEX MICROSCOPIC
Bilirubin Urine: NEGATIVE
Glucose, UA: NEGATIVE mg/dL
Ketones, ur: 5 mg/dL — AB
Nitrite: NEGATIVE
Protein, ur: 100 mg/dL — AB
RBC / HPF: >50 RBC/hpf (ref 0–5)
Specific Gravity, Urine: 1.014 (ref 1.005–1.030)
WBC, UA: >50 WBC/hpf (ref 0–5)
pH: 7 (ref 5.0–8.0)

## 2023-08-30 LAB — CBC WITH DIFFERENTIAL/PLATELET
Abs Immature Granulocytes: 0.12 10*3/uL — ABNORMAL HIGH (ref 0.00–0.07)
Basophils Absolute: 0 10*3/uL (ref 0.0–0.1)
Basophils Relative: 0 %
Eosinophils Absolute: 0 10*3/uL (ref 0.0–0.5)
Eosinophils Relative: 0 %
HCT: 33.3 % — ABNORMAL LOW (ref 39.0–52.0)
Hemoglobin: 9.7 g/dL — ABNORMAL LOW (ref 13.0–17.0)
Immature Granulocytes: 1 %
Lymphocytes Relative: 6 %
Lymphs Abs: 1.1 10*3/uL (ref 0.7–4.0)
MCH: 20.6 pg — ABNORMAL LOW (ref 26.0–34.0)
MCHC: 29.1 g/dL — ABNORMAL LOW (ref 30.0–36.0)
MCV: 70.7 fL — ABNORMAL LOW (ref 80.0–100.0)
Monocytes Absolute: 1.4 10*3/uL — ABNORMAL HIGH (ref 0.1–1.0)
Monocytes Relative: 7 %
Neutro Abs: 16.2 10*3/uL — ABNORMAL HIGH (ref 1.7–7.7)
Neutrophils Relative %: 86 %
Platelets: 422 10*3/uL — ABNORMAL HIGH (ref 150–400)
RBC: 4.71 MIL/uL (ref 4.22–5.81)
RDW: 17.4 % — ABNORMAL HIGH (ref 11.5–15.5)
WBC: 18.9 10*3/uL — ABNORMAL HIGH (ref 4.0–10.5)
nRBC: 0 % (ref 0.0–0.2)

## 2023-08-30 LAB — PROTIME-INR
INR: 1.3 — ABNORMAL HIGH (ref 0.8–1.2)
Prothrombin Time: 15.9 s — ABNORMAL HIGH (ref 11.4–15.2)

## 2023-08-30 LAB — COMPREHENSIVE METABOLIC PANEL WITH GFR
ALT: 18 U/L (ref 0–44)
AST: 28 U/L (ref 15–41)
Albumin: 2.2 g/dL — ABNORMAL LOW (ref 3.5–5.0)
Alkaline Phosphatase: 92 U/L (ref 38–126)
Anion gap: 14 (ref 5–15)
BUN: 7 mg/dL (ref 6–20)
CO2: 18 mmol/L — ABNORMAL LOW (ref 22–32)
Calcium: 8.3 mg/dL — ABNORMAL LOW (ref 8.9–10.3)
Chloride: 97 mmol/L — ABNORMAL LOW (ref 98–111)
Creatinine, Ser: 0.56 mg/dL — ABNORMAL LOW (ref 0.61–1.24)
GFR, Estimated: >60 mL/min (ref >60)
Glucose, Bld: 79 mg/dL (ref 70–99)
Potassium: 3.4 mmol/L — ABNORMAL LOW (ref 3.5–5.1)
Sodium: 129 mmol/L — ABNORMAL LOW (ref 135–145)
Total Bilirubin: 0.7 mg/dL (ref 0.0–1.2)
Total Protein: 7.6 g/dL (ref 6.5–8.1)

## 2023-08-30 LAB — I-STAT CG4 LACTIC ACID, ED: Lactic Acid, Venous: 1.9 mmol/L (ref 0.5–1.9)

## 2023-08-30 MED ORDER — METRONIDAZOLE 500 MG/100ML IV SOLN
500.0000 mg | Freq: Once | INTRAVENOUS | Status: DC
Start: 2023-08-30 — End: 2023-08-31

## 2023-08-30 MED ORDER — AMOXICILLIN-POT CLAVULANATE 875-125 MG PO TABS: 1.0000 | ORAL_TABLET | Freq: Two times a day (BID) | ORAL | 0 refills | Status: AC

## 2023-08-30 MED ORDER — VANCOMYCIN HCL IN DEXTROSE 1-5 GM/200ML-% IV SOLN: 1000.0000 mg | Freq: Once | INTRAVENOUS | Status: DC

## 2023-08-30 MED ORDER — LACTATED RINGERS IV BOLUS (SEPSIS)
1000.0000 mL | Freq: Once | INTRAVENOUS | Status: DC
Start: 2023-08-30 — End: 2023-08-31

## 2023-08-30 MED ORDER — SODIUM CHLORIDE 0.9 % IV SOLN: 2.0000 g | Freq: Once | INTRAVENOUS | Status: DC

## 2023-08-30 MED ORDER — ONDANSETRON 4 MG PO TBDP: 4.0000 mg | ORAL_TABLET | Freq: Three times a day (TID) | ORAL | 0 refills | Status: AC | PRN

## 2023-08-30 MED ORDER — LACTATED RINGERS IV SOLN: INTRAVENOUS | Status: DC

## 2023-08-30 MED ORDER — LACTATED RINGERS IV BOLUS (SEPSIS): 500.0000 mL | Freq: Once | INTRAVENOUS | Status: DC

## 2023-08-30 NOTE — ED Provider Triage Note (Cosign Needed Addendum)
 Emergency Medicine Provider Triage Evaluation Note  Lee Reilly , a 38 y.o. male  was evaluated in triage.  Pt complains of fatigue, fever, nonspecific symptoms.  Patient is paralyzed below the level of T4.  Patient has been feeling generally unwell since this week, reports he was feeling hot and cold getting Tuesday but he did not take his temperature, he noted a fever of over 100 F yesterday.  He cannot determine if he has had any back pain/abdominal pain, but was seen urgent care earlier in the day, and abdominal x-ray was performed and a calcification was reported to the patient, he was told by the provider there that they suspect this is likely due to a renal stone.  No history of prior stones.  Denies nausea/vomiting/diarrhea, no change in appearance of urine.  Review of Systems  Positive: As above Negative: As above  Physical Exam  BP 116/71 (BP Location: Left Arm)   Pulse (!) 128   Temp 98.6 F (37 C) (Oral)   Resp 18   Ht 5' 3 (1.6 m)   Wt 45.4 kg   SpO2 100%   BMI 17.71 kg/m  Gen:   Awake, no distress   Resp:  Normal effort  MSK:   Moves extremities without difficulty  Other:  Patient is pale appearing and tachycardic.  Abdomen is soft, patient has no sensation to abdomen/back so unable to assess for abdominal tenderness or CVA tenderness.  Medical Decision Making  Medically screening exam initiated at 5:59 PM.  Appropriate orders placed.  Lee Reilly was informed that the remainder of the evaluation will be completed by another provider, this initial triage assessment does not replace that evaluation, and the importance of remaining in the ED until their evaluation is complete.  Around 8:30 PM patient was brought back to triage from the waiting area as he was found to have a heart rate in the 150s, I did review his lab work that was ordered from triage and he was found to have significant leukocytosis as well as concerning findings on CT.  At this point I felt it  appropriate to activate sepsis protocols on this patient, including fluids and IV antibiotics.  He was then subsequently moved to a room.     Kendrick Pax, PA-C 08/30/23 1802    Kendrick Pax, [place] 08/30/23 2056

## 2023-08-30 NOTE — Progress Notes (Signed)
 Pt being followed by [facility] for Sepsis protocol.

## 2023-08-30 NOTE — ED Provider Notes (Signed)
 [facility] AT [facility] Provider Note   CSN: [id number] Arrival date & time: 08/30/23  1725     Patient presents with: Abnormal Abd xray and Abdominal Pain   Lee Reilly is a 38 y.o. male.  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  He is presenting with 1 week of pain in his right upper abdomen.  Associated with fever up to 100 and decreased appetite.  He also said he has been poorly sleeping but he does not think it is from a medical condition.  He went to urgent care today who recommended he come here for further evaluation.  He feels he has a kidney stone and would like medication to break it up.  He said he is always tachycardic.  he said he cannot be admitted to the hospital because he would sleep better in his own bed.  No cough or shortness of breath.  Has chronic wounds on his buttocks that he does home dressing changes by his mother.  Of note he was admitted in April and had irrigation and debridement of his wounds.   The history is provided by the patient.  Abdominal Pain Pain location:  RUQ Associated symptoms: fever   Associated symptoms: no chest pain, no dysuria and no shortness of breath        Prior to Admission medications   Medication Sig Start Date End Date Taking? Authorizing Provider  cyanocobalamin  (VITAMIN B12) 1000 MCG tablet Take 1,000 mcg by mouth daily.    [provider]  docusate sodium  (COLACE) 100 MG capsule Take 1 capsule (100 mg total) by mouth daily. 05/19/22   Christobal Guadalajara, MD  ferrous gluconate (FERGON) 324 MG tablet Take 324 mg by mouth daily with breakfast. 03/27/23   [provider]  leptospermum manuka honey (MEDIHONEY) PSTE paste Apply 1 Application topically daily. 09/03/22   Harden Jerona GAILS, MD  ondansetron  (ZOFRAN -ODT) 4 MG disintegrating tablet Take 1 tablet (4 mg total) by mouth every 8 (eight) hours as needed for nausea or  vomiting. 06/19/23   Landy Honora CROME, PA-C    Allergies: Patient has no known allergies.    Review of Systems  Constitutional:  Positive for fever.  Eyes:  Negative for visual disturbance.  Respiratory:  Negative for shortness of breath.   Cardiovascular:  Negative for chest pain.  Gastrointestinal:  Positive for abdominal pain.  Genitourinary:  Negative for dysuria.  Skin:  Positive for wound.  Neurological:  Negative for headaches.    Updated Vital Signs BP 93/66   Pulse (!) 156   Temp 99 F (37.2 C)   Resp 18   Ht 5' 3 (1.6 m)   Wt 45.4 kg   SpO2 100%   BMI 17.71 kg/m   Physical Exam Vitals and nursing note reviewed.  Constitutional:      General: He is not in acute distress. HENT:     Head: Normocephalic and atraumatic.   Eyes:     Conjunctiva/sclera: Conjunctivae normal.    Cardiovascular:     Rate and Rhythm: Regular rhythm. Tachycardia present.     Heart sounds: No murmur heard. Pulmonary:     Effort: Pulmonary effort is normal. No respiratory distress.     Breath sounds: Normal breath sounds.  Abdominal:     Palpations: Abdomen is soft.     Tenderness: There is no abdominal tenderness. There is no guarding  or rebound.  Genitourinary:    Comments: He has some mild erythema and some moisture in his perineal area.  There is no significant crepitus and I do not feel a discrete abscess.  Musculoskeletal:        General: No tenderness.     Cervical back: Neck supple.     Comments: He has a large deep decubitus over his left hip.  There is no significant drainage and no surrounding erythema.   Skin:    General: Skin is warm and dry.     Capillary Refill: Capillary refill takes less than 2 seconds.   Neurological:     Mental Status: He is alert and oriented to person, place, and time.     Comments: He has no use of his lower extremities.  They are atrophied.  He is sitting in a wheelchair.  He has significant lordosis of his lower back and a well-healed  surgical scar.    (all labs ordered are listed, but only abnormal results are displayed) Labs Reviewed  COMPREHENSIVE METABOLIC PANEL WITH GFR - Abnormal; Notable for the following components:      Result Value   Sodium 129 (*)    Potassium 3.4 (*)    Chloride 97 (*)    CO2 18 (*)    Creatinine, Ser 0.56 (*)    Calcium 8.3 (*)    Albumin  2.2 (*)    All other components within normal limits  CBC WITH DIFFERENTIAL/PLATELET - Abnormal; Notable for the following components:   WBC 18.9 (*)    Hemoglobin 9.7 (*)    HCT 33.3 (*)    MCV 70.7 (*)    MCH 20.6 (*)    MCHC 29.1 (*)    RDW 17.4 (*)    Platelets 422 (*)    Neutro Abs 16.2 (*)    Monocytes Absolute 1.4 (*)    Abs Immature Granulocytes 0.12 (*)    All other components within normal limits  URINALYSIS, ROUTINE W REFLEX MICROSCOPIC - Abnormal; Notable for the following components:   APPearance TURBID (*)    Hgb urine dipstick SMALL (*)    Ketones, ur 5 (*)    Protein, ur 100 (*)    Leukocytes,Ua MODERATE (*)    Bacteria, UA MANY (*)    Non Squamous Epithelial 0-5 (*)    All other components within normal limits  PROTIME-INR - Abnormal; Notable for the following components:   Prothrombin Time 15.9 (*)    INR 1.3 (*)    All other components within normal limits  CULTURE, BLOOD (ROUTINE X 2)  CULTURE, BLOOD (ROUTINE X 2)  URINE CULTURE  I-STAT CG4 LACTIC ACID, ED    EKG: EKG Interpretation Date/Time:  Friday August 30 2023 20:25:15 EDT Ventricular Rate:  132 PR Interval:  130 QRS Duration:  86 QT Interval:  314 QTC Calculation: 465 R Axis:   76  Text Interpretation: Sinus tachycardia Left ventricular hypertrophy with repolarization abnormality ( Sokolow-Lyon , Romhilt-Estes ) Abnormal ECG When compared with ECG of 25-Jun-2022 00:56, No significant change since last tracing Confirmed by Towana Sharper [phone]) on 08/31/2023 10:34:37 AM  Radiology: [facility] Chest Port 1 View Result Date: 08/30/2023 CLINICAL DATA:  Sepsis  EXAM: PORTABLE CHEST 1 VIEW COMPARISON:  06/25/2022, 05/29/2021 FINDINGS: Thoracic dextroscoliosis with thoracolumbar stabilization rods again noted. Right perihilar soft tissue opacity is stable since prior examination likely represents vascular shadow, though is not well assessed on this examination. Underlying vascular aneurysm, mediastinal adenopathy, or a mediastinal  or hilar mass could appear similarly. Lungs are otherwise clear. No pneumothorax or pleural effusion. Cardiac size within normal limits. No acute bone abnormality. IMPRESSION: 1. Stable right perihilar soft tissue opacity. In absence of prior imaging, dedicated nonemergent CT imaging with contrast would be helpful for further evaluation. 2. No radiographic evidence of acute cardiopulmonary disease. Electronically Signed   By: Dorethia Molt M.D.   On: 08/30/2023 21:07   CT Renal Stone Study Result Date: 08/30/2023 CLINICAL DATA:  Abdominal and flank pain EXAM: CT ABDOMEN AND PELVIS WITHOUT CONTRAST TECHNIQUE: Multidetector CT imaging of the abdomen and pelvis was performed following the standard protocol without IV contrast. RADIATION DOSE REDUCTION: This exam was performed according to the departmental dose-optimization program which includes automated exposure control, adjustment of the mA and/or kV according to patient size and/or use of iterative reconstruction technique. COMPARISON:  None Available. FINDINGS: Lower chest: Lower thoracic deformity related to severe although corrected scoliosis with prominent dextroconvex rotary scoliosis and extensive posterior element fusion with bar and cerclage fixation. Lower thoracic para-aortic lymph node 0.8 cm in short axis on image 5 series 3, probably a reactive lymph node. On the scout image, the sail shaped right paramediastinal/right perihilar density/mass is again observed. Hepatobiliary: Unremarkable Pancreas: Unremarkable Spleen: Unremarkable Adrenals/Urinary Tract: Adrenal glands  unremarkable. The right kidney appears normal. There is an unusual pattern of prominent distention of the left renal collecting system completely filled with heavy calcification extending in the infundibulum, calices, and renal pelvis. This casting calcification measures up to 10.3 by 6.3 by 5.4 cm. Presumably this represents an extreme case of staghorn calculi. There is mild left hydroureter extending to the urinary bladder, along with mild left ureteral wall thickening which may be from mild ureteritis. I do not see a current bladder calculus. A 2.0 cm right kidney upper pole hypodense lesion is partially obscured by streak artifact from the lumbar hardware but is thought to represent a simple cyst. No further imaging workup of this lesion is indicated. Stomach/Bowel: Unremarkable Vascular/Lymphatic: Scattered mostly small periaortic lymph nodes are present measuring up to 0.9 cm in diameter (image 21 series 3). Likely reactive left external iliac and inguinal lymph nodes are present with a left external iliac node at 1.1 cm in diameter. Reproductive: Prostate indistinct. Suspected scrotal and perineal cutaneous wall thickening, also extending in the left upper thigh posteromedially and in the gluteal cleft, visual inspection recommended to exclude cutaneous manifestations of fasciitis. Other: No supplemental non-categorized findings. Musculoskeletal: In addition to the scoliosis multilevel spinal fixation noted above, there is diffuse dural calcification in the lower thoracic and lumbar spine, suspected dysplastic right hip and acetabulum with right hip effusion, severe muscular atrophy in the lower abdomen and especially the pelvis. Resorption of the left hip noted with scattered bony fragments adjacent to a large decubitus ulcer extending towards the hip, with abnormal gas tracking adjacent to the left iliac crest and in the soft tissues of the upper thigh. Chronic osteomyelitis involving the left hemipelvis is  not entirely excluded. Dysplastic lower pelvis noted. IMPRESSION: 1. Unusual pattern of prominent distention of the left renal collecting system completely filled with heavy calcification extending in the infundibula, calices, and renal pelvis. Presumably this represents an extreme case of staghorn calculi. There is mild left hydroureter extending to the urinary bladder, along with mild left ureteral wall thickening which may be from mild ureteritis. I do not see a current bladder calculus. Urology consultation recommended. 2. Suspected scrotal and perineal cutaneous wall thickening, also  extending in the left upper thigh posteromedially and in the gluteal cleft, visual inspection recommended to exclude cutaneous manifestations of necrotizing fasciitis. If there are findings of fasciitis, urgent surgical consultation would be recommended. 3. Deep decubitus ulcer extending towards the left hip, with scattered bony fragments adjacent to the decubitus ulcer, with abnormal gas tracking adjacent to the left iliac crest and in the soft tissues of the upper thigh. Chronic osteomyelitis involving the left hemipelvis is not entirely excluded. 4. Severe muscular atrophy in the lower abdomen and especially the pelvis. 5. Severe scoliosis with extensive posterior element fusion with rod and cerclage fixation. 6. Diffuse dural calcification in the lower thoracic and lumbar spine. 7. Suspected dysplastic right hip and acetabulum with right hip effusion. 8. On the scout image, the sail shaped right paramediastinal/right perihilar density/mass is again observed. If this has not been characterized by chest CT at an outside institution, chest CT should be considered in order to understand the cause of this paramediastinal mass/density. Electronically Signed   By: Ryan Salvage M.D.   On: 08/30/2023 19:59     .Critical Care  Performed by: Towana Ozell BROCKS, MD Authorized by: Towana Ozell BROCKS, MD   Critical care provider  statement:    Critical care time (minutes):  30   Critical care was necessary to treat or prevent imminent or life-threatening deterioration of the following conditions:  Sepsis   Critical care was time spent personally by me on the following activities:  Development of treatment plan with patient or surrogate, discussions with consultants, evaluation of patient's response to treatment, examination of patient, ordering and review of laboratory studies, ordering and review of radiographic studies, ordering and performing treatments and interventions, pulse oximetry, re-evaluation of patient's condition and review of old charts    Medications Ordered in the ED - No data to display   Clinical Course as of 08/31/23 1033  Fri Aug 30, 2023  2221 Patient's workup concerning for elevated infection cell counts new hyponatremia low bicarb.  Fortunately his lactate is normal.  Urinalysis concerning for infection.  I have added on a culture.  Had extensive conversation with the patient and he is declining admission.  We discussed sepsis, Fournier's gangrene, staghorn calculus.  He is understanding of the critical nature of these illnesses and he declines admission to the hospital.  He will except oral antibiotics and information for following up with urology.  He understands he could become much sicker and can even die.  He appears competent to make this decision [MB]  2225 Of note IV fluids and IV antibiotics were ordered at triage and he is declining placement of an IV or giving of antibiotics. [MB]  2225 He is also refusing to take an oral antibiotic here but said we can send a prescription to the pharmacy along with a prescription for some nausea medicine.  He said he has not eaten anything and he does not want to take it on an empty stomach. [MB]    Clinical Course User Index [MB] Towana Ozell BROCKS, MD                                 Medical Decision Making Amount and/or Complexity of Data  Reviewed Labs: ordered.  Risk Prescription drug management.   This patient complains of abdominal pain fever decreased appetite; this involves an extensive number of treatment Options and is a complaint that carries with it a  high risk of complications and morbidity. The differential includes sepsis, Sirs, peptic ulcer disease, biliary colic, pyelonephritis, renal colic, perforation, pneumonia  I ordered, reviewed and interpreted labs, which included CBC with markedly elevated white count, hemoglobin stable low, chemistries with low sodium low potassium low bicarb, urinalysis concerning for infection sent for culture, blood culture sent, lactate not elevated I ordered medication IV antibiotics and fluids.  And reviewed PMP when indicated.  Patient is declining this. I ordered imaging studies which included chest x-ray, CT renal and I independently    visualized and interpreted imaging which showed large staghorn calculus.  Left-sided mild hydroureter.  Scrotal and perineal thickening concern for infection.  Large decubiti. Additional history obtained from patient's mother Previous records obtained and reviewed In epic including recent discharge summary and outpatient orthopedic and plastic surgery notes Cardiac monitoring reviewed, sinus tachycardia Social determinants considered, no significant barriers Critical Interventions: Patient with signs and symptoms concerning for sepsis and was worked up as such.  He is declining any intervention  After the interventions stated above, I reevaluated the patient and found patient remains awake alert although tachycardic with soft blood pressures Admission and further testing considered, he would benefit from mission to the hospital IV antibiotics and IV fluids for further resuscitation.  He will likely also benefit from evaluation by his plastic surgery team and neurology.  Patient is declining this and is asking to be discharged.  He is signing out  AGAINST MEDICAL ADVICE.  He appears to understand his condition and the treatment options presented to him.      Final diagnoses:  Acute cystitis with hematuria  Staghorn renal calculus  Sinus tachycardia  Hyponatremia  Decubitus ulcer of left hip, unstageable (HCC)  Paraplegia at T4 level [facility])  Erythema of scrotum    ED Discharge Orders          Ordered    amoxicillin -clavulanate (AUGMENTIN ) 875-125 MG tablet  Every 12 hours        08/30/23 2227    ondansetron  (ZOFRAN -ODT) 4 MG disintegrating tablet  Every 8 hours PRN        08/30/23 2227    ondansetron  (ZOFRAN -ODT) 4 MG disintegrating tablet  Every 8 hours PRN        08/30/23 2227               Towana Ozell BROCKS, MD 08/31/23 1040

## 2023-08-30 NOTE — Discharge Instructions (Addendum)
 You were seen in the emergency department for nausea vomiting poor appetite and abdominal pain.  Your workup was concerning for infection possibly from a urinary source.  Your CAT scan showed a large staghorn calculus.  You were offered admission to the hospital for IV antibiotics and urology evaluation and you declined this.  We are starting you on some oral antibiotics and some nausea medication.  Please drink plenty of fluids and follow-up with your treatment team.  Please also reach out to urology and schedule an appointment with them.  Return to the emergency department if you change your mind about admission or if any worsening symptoms.

## 2023-08-30 NOTE — ED Notes (Signed)
 Patient refused vital signs. Patient signed AMA forms

## 2023-08-30 NOTE — ED Notes (Signed)
 PA at triage and charge nurse notified on patient's elevated heart rate .

## 2023-08-30 NOTE — ED Triage Notes (Signed)
 Pt came in via POV d/t fatigue, loss of appetite, heaviness in stomach, lack of sleep, fever/chills & feeling lightheaded the past few days. Pt reports he has had a stomach ache unusually long, his I?O for urine & stools have been normal but he has an episode of dark stool (after taking pepto bismol). Reports an xray of the abd at an UC was showing a calcification to the Rt side of his abd & has the CD with the imaging with him. Pt reports he has T4 level paralysis & does not feel like usual in his abd area.

## 2023-08-30 NOTE — ED Notes (Signed)
 Patient states he wanted to hold off on treatment until a doctor comes and see him again. MD made aware

## 2023-09-03 LAB — URINE CULTURE: Culture: >=100000 — AB

## 2023-09-04 ENCOUNTER — Telehealth (HOSPITAL_BASED_OUTPATIENT_CLINIC_OR_DEPARTMENT_OTHER): Payer: Self-pay

## 2023-09-04 ENCOUNTER — Telehealth: Payer: Self-pay

## 2023-09-04 ENCOUNTER — Ambulatory Visit (INDEPENDENT_AMBULATORY_CARE_PROVIDER_SITE_OTHER): Admitting: Surgical

## 2023-09-04 VITALS — BP 86/49 | HR 139

## 2023-09-04 DIAGNOSIS — L89220 Pressure ulcer of left hip, unstageable: Secondary | ICD-10-CM | POA: Diagnosis not present

## 2023-09-04 LAB — CULTURE, BLOOD (ROUTINE X 2)
Culture: NO GROWTH
Culture: NO GROWTH

## 2023-09-04 NOTE — Progress Notes (Signed)
   Referring Provider Vaughn Lauraine NOVAK, PA-C No address on file   CC:  Chief Complaint  Patient presents with   Post-op Follow-up      Lee Reilly is an 38 y.o. male.  HPI: Patient is a 38 year old male here for follow-up on his nonhealing left hip wound.  He has history of paraplegia.  He did undergo debridement with placement myriad on 06/28/2023 with Dr. Waddell.  He has been packing the wound with Vashe soaked gauze twice daily with assistance from his family.  He was last seen in the office about 3 weeks ago.  He presents today with his mom.  They report that he has overall been doing well in regards to wound care, they have been packing the area twice daily with Vashe soaked gauze, covering with dry gauze and ABD pad.  He does report that he was recently seen in the emergency room and has a kidney stone.  He reports he is going to need to see urology in regards to this.  Dr Waddell was able to evaluate Lee Reilly during today's appointment, discussed with him plans for increasing protein And nutritional intake to plan for advancement flap procedure.  Review of Systems General: No fevers or chills  Physical Exam    09/04/2023    1:42 PM 08/30/2023    8:06 PM 08/30/2023    5:43 PM  Vitals with BMI  Height   5' 3  Weight   100 lbs  BMI   17.72  Systolic 86 93   Diastolic 49 66   Pulse 139 156     General:  No acute distress,  Alert and oriented, Non-Toxic, Normal speech and affect Left hip wound with new granulation tissue formed at the base of the wound.  Wound is 5 x 4.5 x 3 cm.  There is no surrounding erythema or cellulitic change.  No foul odors or purulent drainage noted.  No crepitus is noted.  Wound is continuing to track inferiorly and superiorly, greater tracking is noted superiorly about 2.5 cm.    Assessment/Plan Patient is doing well with Vashe soaked Kerlix packing twice daily.  Recommend continuing with this.  Discussed with patient and his mom that we may be  able to transition to Mepilex border dressings as an external dressing for more comfort as well as support to prevent additional pressure injury.  Will place order via [facility]  for delivery.  Recommend following up in 3 to 4 weeks for reevaluation. Recommend optimizing nutritional status with protein shakes and healthy diet in preparation for advancement flap with Dr. Waddell.  Dr. Waddell discussed advancement flap surgery with patient.  Pictures were obtained of the patient and placed in the chart with the patient's or guardian's permission.   Lee Reilly 09/04/2023, 2:42 PM

## 2023-09-04 NOTE — Telephone Encounter (Signed)
 Faxed completed form requesting wound supplies to [employer]  [phone]). Confirmation of receipt

## 2023-09-04 NOTE — Telephone Encounter (Signed)
 Post ED Visit - Positive Culture Follow-up: Successful Patient Follow-Up  Culture assessed and recommendations reviewed by:  [x]  Leonor Bash, Pharm.D. []  Venetia Gully, Pharm.D., BCPS AQ-ID []  Garrel Crews, Pharm.D., BCPS []  Almarie Lunger, [address].D., BCPS []  [place], [address].D., BCPS, AAHIVP []  Rosaline Bihari, Pharm.D., BCPS, AAHIVP []  Vernell Meier, PharmD, BCPS []  Latanya Hint, PharmD, BCPS []  Donald Medley, PharmD, BCPS []  Rocky Bold, PharmD  Positive urine culture  []  Patient discharged without antimicrobial prescription and treatment is now indicated [x]  Organism is resistant to prescribed ED discharge antimicrobial []  Patient with positive blood cultures  Changes discussed with ED provider: Cameron Servant, PA-C New antibiotic prescription Cipro  500 mg po Q 12 hours x 7 days  Called to [facility] on [address] [place]  Contacted patient, date 09/04/23, time 11:35 am   Lee Reilly 09/04/2023, 11:38 AM

## 2023-09-05 DIAGNOSIS — R32 Unspecified urinary incontinence: Secondary | ICD-10-CM | POA: Diagnosis not present

## 2023-09-05 DIAGNOSIS — L89524 Pressure ulcer of left ankle, stage 4: Secondary | ICD-10-CM | POA: Diagnosis not present

## 2023-09-05 DIAGNOSIS — N200 Calculus of kidney: Secondary | ICD-10-CM | POA: Diagnosis not present

## 2023-09-08 ENCOUNTER — Telehealth: Payer: Self-pay

## 2023-09-08 NOTE — Telephone Encounter (Signed)
 Patients mother called in to say  her son is feeling very bloated and wonder if antibiotic could be switched he is not eating well. He was seen in the ED on June 20. Discussed return precautions. She will try gas X and then call his PCP iin the AM.

## 2023-09-10 DIAGNOSIS — T8189XA Other complications of procedures, not elsewhere classified, initial encounter: Secondary | ICD-10-CM | POA: Diagnosis not present

## 2023-09-11 DIAGNOSIS — N2889 Other specified disorders of kidney and ureter: Secondary | ICD-10-CM | POA: Diagnosis not present

## 2023-09-11 DIAGNOSIS — N132 Hydronephrosis with renal and ureteral calculous obstruction: Secondary | ICD-10-CM | POA: Diagnosis not present

## 2023-09-11 DIAGNOSIS — N200 Calculus of kidney: Secondary | ICD-10-CM | POA: Diagnosis not present

## 2023-09-12 ENCOUNTER — Telehealth: Payer: Self-pay | Admitting: Surgical

## 2023-09-12 NOTE — Telephone Encounter (Signed)
 Patients mother called and would like to know if you could order abd combined pads 5x9, the sent the wrong pads, please reach out

## 2023-09-12 NOTE — Telephone Encounter (Signed)
 She can ask them what the cash price would be, or continue to pay out of pocket at pharmacy/[facility]/medical supply store.

## 2023-09-15 DIAGNOSIS — R32 Unspecified urinary incontinence: Secondary | ICD-10-CM | POA: Diagnosis not present

## 2023-09-20 DIAGNOSIS — R3981 Functional urinary incontinence: Secondary | ICD-10-CM | POA: Diagnosis not present

## 2023-09-20 DIAGNOSIS — G8220 Paraplegia, unspecified: Secondary | ICD-10-CM | POA: Diagnosis not present

## 2023-09-21 DIAGNOSIS — Z419 Encounter for procedure for purposes other than remedying health state, unspecified: Secondary | ICD-10-CM | POA: Diagnosis not present

## 2023-09-23 DIAGNOSIS — N200 Calculus of kidney: Secondary | ICD-10-CM | POA: Diagnosis not present

## 2023-09-25 ENCOUNTER — Telehealth: Payer: Self-pay | Admitting: Surgical

## 2023-09-25 ENCOUNTER — Encounter: Admitting: Surgical

## 2023-09-25 NOTE — Telephone Encounter (Signed)
 Patients mother has questions about his wound and would like for someone to call her, please reach out

## 2023-09-25 NOTE — Telephone Encounter (Signed)
 Called and spoke with patient's mother Zebedee, she reports that he is still dealing with his kidney stone and he has been having a lot of discomfort from this.  She reports he has been more fatigued and not as mobile.  She reports he has been laying on the wound side much more than normal.  He was scheduled for an appointment today, however due to his fatigue and pain they have decided to not come to appointment.  I discussed with patients mother that I think that is reasonable, encouraged her to just continue with packing the gauze 1-2 times daily with Vashe soaked Kerlix.  We discussed signs of concern including purulence, increased redness, increased odor.  We discussed following up as soon as possible once he is feeling better.  He is currently scheduled for an appointment in 12 days.  I discussed that if he does start feeling worse from the kidney, to please notify the nephrology group that is managing his kidney stone.  Patient's mother was appreciative of the call.  Recommend calling with any concerns or questions.

## 2023-10-01 DIAGNOSIS — N200 Calculus of kidney: Secondary | ICD-10-CM | POA: Diagnosis not present

## 2023-10-07 ENCOUNTER — Telehealth: Payer: Self-pay | Admitting: Plastic Surgery

## 2023-10-07 ENCOUNTER — Encounter: Admitting: Surgical

## 2023-10-07 NOTE — Telephone Encounter (Signed)
 pt is having kidney sx in 2 weeks, Zebedee called to cancel apt and wants the provider to call as soon as possible , pt cancel apt today

## 2023-10-08 ENCOUNTER — Telehealth: Payer: Self-pay

## 2023-10-08 ENCOUNTER — Ambulatory Visit (INDEPENDENT_AMBULATORY_CARE_PROVIDER_SITE_OTHER): Admitting: Surgical

## 2023-10-08 DIAGNOSIS — A419 Sepsis, unspecified organism: Secondary | ICD-10-CM | POA: Diagnosis not present

## 2023-10-08 DIAGNOSIS — N390 Urinary tract infection, site not specified: Secondary | ICD-10-CM | POA: Diagnosis not present

## 2023-10-08 DIAGNOSIS — L89220 Pressure ulcer of left hip, unstageable: Secondary | ICD-10-CM

## 2023-10-08 NOTE — Progress Notes (Signed)
 Patient is a 38 year old male with a nonhealing left hip wound, he has a history of paraplegia.  He has underwent debridement with placement of myriad with Dr. Waddell on 06/28/2023.  His mom has been assisting him with packing the wound of his left hip with Vashe soaked Kerlix twice daily.  He has been tolerating this well.  He was scheduled for an office visit, however has been dealing with a kidney stone which has been causing him to have some problems.  Mom reports that he has been sleeping on the left hip wound more frequently due to it being more comfortable.  She has continued to pack the wound with Vashe soaked gauze/Kerlix and he is tolerating this well.  She reports no improvement in the wound, but also is aware that he has been laying on that area more frequently.  She reports today that they are currently in the car together on the way to the emergency room so he can get a PICC line placed to receive IV antibiotics at home.  She mentions that they will schedule an appointment in person once he has recovered from his urological issues and call the office for an in person appointment for evaluation of the left hip wound.  Encouraged patient's mom to call with any further questions or concerns and we could certainly see them in office at any point.  The patient and patient's mother gave consent to have this visit done by telemedicine / virtual visit, two identifiers were used to identify patient. This is also consent for access the chart and treat the patient via this visit. The patient is located in [place] .  I, the provider, am at the office.  We spent 2 minutes together for the visit.  Joined by telephone.

## 2023-10-08 NOTE — Telephone Encounter (Signed)
 I called and spoke to the patient's mother. She wanted to let us  know that she is taking the patient to the hospital at 3:30 for IV antibiotics and didn't want to miss her call with [facility] at 4:30. The antibiotics are for a UTI that the patient is currently experiencing. She wanted to let us  know that the wound does not look better. The patient cannot sleep on his right side. The mother is hoping that once the antibiotics start to work, the perfuse sweating symptoms will subside. I let her know that I will relay the message to [facility] and we will work on getting the patient back on the schedule. She stated that tomorrow would be a better day for a telehealth, and once the patient is feeling better, she will call and schedule an in person visit.

## 2023-10-09 DIAGNOSIS — A419 Sepsis, unspecified organism: Secondary | ICD-10-CM | POA: Diagnosis not present

## 2023-10-09 DIAGNOSIS — I340 Nonrheumatic mitral (valve) insufficiency: Secondary | ICD-10-CM | POA: Diagnosis not present

## 2023-10-09 DIAGNOSIS — R6521 Severe sepsis with septic shock: Secondary | ICD-10-CM | POA: Diagnosis not present

## 2023-10-09 DIAGNOSIS — Z515 Encounter for palliative care: Secondary | ICD-10-CM | POA: Diagnosis not present

## 2023-10-10 DIAGNOSIS — G8220 Paraplegia, unspecified: Secondary | ICD-10-CM | POA: Diagnosis not present

## 2023-10-10 DIAGNOSIS — R6521 Severe sepsis with septic shock: Secondary | ICD-10-CM | POA: Diagnosis not present

## 2023-10-10 DIAGNOSIS — R570 Cardiogenic shock: Secondary | ICD-10-CM | POA: Diagnosis not present

## 2023-10-10 DIAGNOSIS — Z515 Encounter for palliative care: Secondary | ICD-10-CM | POA: Diagnosis not present

## 2023-10-10 DIAGNOSIS — N390 Urinary tract infection, site not specified: Secondary | ICD-10-CM | POA: Diagnosis not present

## 2023-10-10 DIAGNOSIS — A419 Sepsis, unspecified organism: Secondary | ICD-10-CM | POA: Diagnosis not present

## 2023-10-10 DIAGNOSIS — E46 Unspecified protein-calorie malnutrition: Secondary | ICD-10-CM | POA: Diagnosis not present

## 2023-10-11 DIAGNOSIS — R579 Shock, unspecified: Secondary | ICD-10-CM | POA: Diagnosis not present

## 2023-10-11 DIAGNOSIS — Z7409 Other reduced mobility: Secondary | ICD-10-CM | POA: Diagnosis not present

## 2023-10-11 DIAGNOSIS — R6521 Severe sepsis with septic shock: Secondary | ICD-10-CM | POA: Diagnosis not present

## 2023-10-11 DIAGNOSIS — I499 Cardiac arrhythmia, unspecified: Secondary | ICD-10-CM | POA: Diagnosis not present

## 2023-10-11 DIAGNOSIS — Z515 Encounter for palliative care: Secondary | ICD-10-CM | POA: Diagnosis not present

## 2023-10-11 DIAGNOSIS — A419 Sepsis, unspecified organism: Secondary | ICD-10-CM | POA: Diagnosis not present

## 2023-10-12 DIAGNOSIS — N200 Calculus of kidney: Secondary | ICD-10-CM | POA: Diagnosis not present

## 2023-10-12 DIAGNOSIS — R6521 Severe sepsis with septic shock: Secondary | ICD-10-CM | POA: Diagnosis not present

## 2023-10-12 DIAGNOSIS — M869 Osteomyelitis, unspecified: Secondary | ICD-10-CM | POA: Diagnosis not present

## 2023-10-12 DIAGNOSIS — A419 Sepsis, unspecified organism: Secondary | ICD-10-CM | POA: Diagnosis not present

## 2023-10-12 DIAGNOSIS — E876 Hypokalemia: Secondary | ICD-10-CM | POA: Diagnosis not present

## 2023-10-12 DIAGNOSIS — I471 Supraventricular tachycardia, unspecified: Secondary | ICD-10-CM | POA: Diagnosis not present

## 2023-10-13 DIAGNOSIS — A419 Sepsis, unspecified organism: Secondary | ICD-10-CM | POA: Diagnosis not present

## 2023-10-13 DIAGNOSIS — N200 Calculus of kidney: Secondary | ICD-10-CM | POA: Diagnosis not present

## 2023-10-13 DIAGNOSIS — R6521 Severe sepsis with septic shock: Secondary | ICD-10-CM | POA: Diagnosis not present

## 2023-10-13 DIAGNOSIS — I471 Supraventricular tachycardia, unspecified: Secondary | ICD-10-CM | POA: Diagnosis not present

## 2023-10-13 DIAGNOSIS — E876 Hypokalemia: Secondary | ICD-10-CM | POA: Diagnosis not present

## 2023-10-13 DIAGNOSIS — M869 Osteomyelitis, unspecified: Secondary | ICD-10-CM | POA: Diagnosis not present

## 2023-10-14 DIAGNOSIS — Z515 Encounter for palliative care: Secondary | ICD-10-CM | POA: Diagnosis not present

## 2023-10-14 DIAGNOSIS — R6521 Severe sepsis with septic shock: Secondary | ICD-10-CM | POA: Diagnosis not present

## 2023-10-14 DIAGNOSIS — A419 Sepsis, unspecified organism: Secondary | ICD-10-CM | POA: Diagnosis not present

## 2023-10-15 DIAGNOSIS — A419 Sepsis, unspecified organism: Secondary | ICD-10-CM | POA: Diagnosis not present

## 2023-10-15 DIAGNOSIS — Z515 Encounter for palliative care: Secondary | ICD-10-CM | POA: Diagnosis not present

## 2023-10-15 DIAGNOSIS — N390 Urinary tract infection, site not specified: Secondary | ICD-10-CM | POA: Diagnosis not present

## 2023-10-15 DIAGNOSIS — R6521 Severe sepsis with septic shock: Secondary | ICD-10-CM | POA: Diagnosis not present

## 2023-10-16 DIAGNOSIS — R32 Unspecified urinary incontinence: Secondary | ICD-10-CM | POA: Diagnosis not present

## 2023-10-16 DIAGNOSIS — N200 Calculus of kidney: Secondary | ICD-10-CM | POA: Diagnosis not present

## 2023-10-17 DIAGNOSIS — Z515 Encounter for palliative care: Secondary | ICD-10-CM | POA: Diagnosis not present

## 2023-10-17 DIAGNOSIS — R6521 Severe sepsis with septic shock: Secondary | ICD-10-CM | POA: Diagnosis not present

## 2023-10-17 DIAGNOSIS — N200 Calculus of kidney: Secondary | ICD-10-CM | POA: Diagnosis not present

## 2023-10-18 DIAGNOSIS — N200 Calculus of kidney: Secondary | ICD-10-CM | POA: Diagnosis not present

## 2023-10-19 DIAGNOSIS — N200 Calculus of kidney: Secondary | ICD-10-CM | POA: Diagnosis not present

## 2023-10-20 DIAGNOSIS — N200 Calculus of kidney: Secondary | ICD-10-CM | POA: Diagnosis not present

## 2023-10-21 DIAGNOSIS — N200 Calculus of kidney: Secondary | ICD-10-CM | POA: Diagnosis not present

## 2023-10-22 DIAGNOSIS — Z419 Encounter for procedure for purposes other than remedying health state, unspecified: Secondary | ICD-10-CM | POA: Diagnosis not present

## 2023-10-22 DIAGNOSIS — N200 Calculus of kidney: Secondary | ICD-10-CM | POA: Diagnosis not present

## 2023-10-23 DIAGNOSIS — N200 Calculus of kidney: Secondary | ICD-10-CM | POA: Diagnosis not present

## 2023-10-24 DIAGNOSIS — N200 Calculus of kidney: Secondary | ICD-10-CM | POA: Diagnosis not present

## 2023-10-29 DIAGNOSIS — N200 Calculus of kidney: Secondary | ICD-10-CM | POA: Diagnosis not present

## 2023-10-29 DIAGNOSIS — S24152S Other incomplete lesion at T2-T6 level of thoracic spinal cord, sequela: Secondary | ICD-10-CM | POA: Diagnosis not present

## 2023-10-29 DIAGNOSIS — J4489 Other specified chronic obstructive pulmonary disease: Secondary | ICD-10-CM | POA: Diagnosis not present

## 2023-10-29 DIAGNOSIS — N289 Disorder of kidney and ureter, unspecified: Secondary | ICD-10-CM | POA: Diagnosis not present

## 2023-10-29 DIAGNOSIS — G8220 Paraplegia, unspecified: Secondary | ICD-10-CM | POA: Diagnosis not present

## 2023-10-29 DIAGNOSIS — N118 Other chronic tubulo-interstitial nephritis: Secondary | ICD-10-CM | POA: Diagnosis not present

## 2023-10-29 DIAGNOSIS — N261 Atrophy of kidney (terminal): Secondary | ICD-10-CM | POA: Diagnosis not present

## 2023-10-29 DIAGNOSIS — N10 Acute tubulo-interstitial nephritis: Secondary | ICD-10-CM | POA: Diagnosis not present

## 2023-10-29 DIAGNOSIS — N2889 Other specified disorders of kidney and ureter: Secondary | ICD-10-CM | POA: Diagnosis not present

## 2023-10-30 DIAGNOSIS — Z452 Encounter for adjustment and management of vascular access device: Secondary | ICD-10-CM | POA: Diagnosis not present

## 2023-10-31 DIAGNOSIS — G8220 Paraplegia, unspecified: Secondary | ICD-10-CM | POA: Diagnosis not present

## 2023-10-31 DIAGNOSIS — R3981 Functional urinary incontinence: Secondary | ICD-10-CM | POA: Diagnosis not present

## 2023-10-31 DIAGNOSIS — T8189XA Other complications of procedures, not elsewhere classified, initial encounter: Secondary | ICD-10-CM | POA: Diagnosis not present

## 2023-10-31 NOTE — Telephone Encounter (Signed)
 Patients mother Zebedee needs to know when patient needs his post op follow up appointment, please advise, thanks.  [phone]

## 2023-11-01 DIAGNOSIS — R32 Unspecified urinary incontinence: Secondary | ICD-10-CM | POA: Diagnosis not present

## 2023-11-01 DIAGNOSIS — G8222 Paraplegia, incomplete: Secondary | ICD-10-CM | POA: Diagnosis not present

## 2023-11-03 DIAGNOSIS — I499 Cardiac arrhythmia, unspecified: Secondary | ICD-10-CM | POA: Diagnosis not present

## 2023-11-07 DIAGNOSIS — R5381 Other malaise: Secondary | ICD-10-CM | POA: Diagnosis not present

## 2023-11-07 DIAGNOSIS — G8220 Paraplegia, unspecified: Secondary | ICD-10-CM | POA: Diagnosis not present

## 2023-11-07 DIAGNOSIS — N319 Neuromuscular dysfunction of bladder, unspecified: Secondary | ICD-10-CM | POA: Diagnosis not present

## 2023-11-07 DIAGNOSIS — Z7409 Other reduced mobility: Secondary | ICD-10-CM | POA: Diagnosis not present

## 2023-11-07 DIAGNOSIS — R29898 Other symptoms and signs involving the musculoskeletal system: Secondary | ICD-10-CM | POA: Diagnosis not present

## 2023-11-08 DIAGNOSIS — R000 Tachycardia, unspecified: Secondary | ICD-10-CM | POA: Diagnosis not present

## 2023-11-13 DIAGNOSIS — N39498 Other specified urinary incontinence: Secondary | ICD-10-CM | POA: Diagnosis not present

## 2023-11-13 DIAGNOSIS — R918 Other nonspecific abnormal finding of lung field: Secondary | ICD-10-CM | POA: Diagnosis not present

## 2023-11-13 DIAGNOSIS — Z789 Other specified health status: Secondary | ICD-10-CM | POA: Diagnosis not present

## 2023-11-13 DIAGNOSIS — N319 Neuromuscular dysfunction of bladder, unspecified: Secondary | ICD-10-CM | POA: Diagnosis not present

## 2023-11-13 DIAGNOSIS — N200 Calculus of kidney: Secondary | ICD-10-CM | POA: Diagnosis not present

## 2023-11-13 DIAGNOSIS — I959 Hypotension, unspecified: Secondary | ICD-10-CM | POA: Diagnosis not present

## 2023-11-13 DIAGNOSIS — G8220 Paraplegia, unspecified: Secondary | ICD-10-CM | POA: Diagnosis not present

## 2023-11-13 DIAGNOSIS — F330 Major depressive disorder, recurrent, mild: Secondary | ICD-10-CM | POA: Diagnosis not present

## 2023-11-13 DIAGNOSIS — L97122 Non-pressure chronic ulcer of left thigh with fat layer exposed: Secondary | ICD-10-CM | POA: Diagnosis not present

## 2023-11-13 DIAGNOSIS — Z993 Dependence on wheelchair: Secondary | ICD-10-CM | POA: Diagnosis not present

## 2023-11-15 NOTE — Progress Notes (Signed)
 Patient ID: Lee Reilly is a 38 y.o. male   Chief Complaint This patient is here for post-op FU S/P  PROCEDURES PERFORMED:   1. Open Total Left Nephrectomy    History of Present Illness  Surgeon: EMERSON Loges Date of Surgery: 10/29/2023 Surgery:  PROCEDURES PERFORMED:   1. Open Total Left Nephrectomy    Lee Reilly comes today for a post-op visit. He reports they have done well since surgery. He denies pain. He is having regular BM's. Denies fever, chills, nausea, vomiting, or poor appetite.   Pathology:  LEFT KIDNEY, LEFT NEPHRECTOMY: Renal tubular atrophy with interstitial chronic and acute inflammation. Urothelium with chronic and acute inflammation, squamous differentiation with hyperkeratosis and focal erosion. Benign lymph node. Renal pelvis calculus. No malignancy identified.    Current Medications[1]  Allergies[2]  Medical History[3]  Surgical History[4]  Family History[5]     Objective  Vitals:   11/15/23 0829  BP: (!) 96/53  Pulse: 97  Resp: 18  Temp: 97.6 F (36.4 C)  SpO2: 100%    Physical Exam: Constitutional:  No obvious distress Eyes: PERRL Ears/Nose/Mouth/Throat: No obvious drainage per ears or nose Cardiovascular: Pulse regular Respiratory: No audible wheezing/stridor; respirations do not appear labored Gastrointestinal: Abdomen not distended, soft, non-tender Musculoskeletal: Normal ROM of UEs Skin: No obvious rashes/open sores, incisions CDI with small amount of drainage to the lower part of his incision. Edges well approximated. Staples removed, steristrips applied. Dry dressing applied to site where JP drain was removed.  He denies redness, warmth, tenderness, swelling, or drainage at the incision site(s). He denies fevers.  Neurologic: CN 2-12 grossly intact Psychiatric: Answered questions appropriately w/normal affect Hematologic/Lymphatic/Immunologic: No obvious bruises or sites of spontaneous bleeding Genitourinary:  Bladder not palpable  Data review as noted.  Assessment S/P  PROCEDURES PERFORMED:   1. Open Total Left Nephrectomy    Problem List[6]   Plan Discussed with patient and mother CIC vs SPT they would like to think about it and do their own research. Both patient and mother interested in inpatient rehab, as he has limited function/ dexterity and he lives alone. Mother does assist with CIC, but this requires her to be available ever 4-6 hours. They are hoping that with rehab, he may gain some function. Will reach out to Dr. Loges on behalf of patient.    1. S/p nephrectomy        Advised the following  No orders of the defined types were placed in this encounter.   There are no Patient Instructions on file for this visit.  No follow-ups on file.       [1]  Current Outpatient Medications:  .  midodrine  (PROAMATINE ) 5 mg tablet, Take 1 tablet (5 mg total) by mouth in the morning and 1 tablet (5 mg total) at noon and 1 tablet (5 mg total) in the evening. Take before meals., Disp: 90 tablet, Rfl: 0 .  multivit,tx with iron,minerals (multivitamin with minerals), Take 1 tablet by mouth daily., Disp: , Rfl:  [2] No Known Allergies [3] Past Medical History: Diagnosis Date  . Ankle wound, left, subsequent encounter 06/27/2021  . Hypotension   . Kidney stone 10/01/2023  . Malignant neoplasm of adrenal gland    (CMD) 04/08/2007   Qualifier: Diagnosis of   By: Tita MD, Luis    . 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 Paraplegia    (CMD)   [4] Past Surgical History: Procedure Laterality Date  .  FOOT SURGERY    . NEPHRECTOMY Left 10/29/2023   NEPHRECTOMY TOTAL performed by Rhae Loges, MD at [facility] OR  . SPINAL FUSION     38 yrs old  [5] Family History Problem Relation Name Age of Onset  . Diabetes Father    . Glaucoma Neg Hx    . Macular degeneration Neg Hx    . Retinal detachment Neg Hx    . Thyroid disease Neg Hx    . Stroke Neg  Hx    . Cancer Neg Hx    [6] Patient Active Problem List Diagnosis  . Impaired mobility  . Osteomyelitis of left hip    (CMD)  . Paraplegia    (CMD)  . Impaired instrumental activities of daily living (IADL)  . Acne  . Wheelchair dependence  . Mild episode of recurrent major depressive disorder  . Mild intermittent asthma without complication (CMD)  . Neurogenic bladder  . Hidradenitis suppurativa  . GAD (generalized anxiety disorder)  . Protein-calorie malnutrition, severe (CMD)  . Ulcer of trochanteric region of left hip with fat layer exposed    (CMD)  . Hypotension

## 2023-11-15 NOTE — Telephone Encounter (Signed)
 Copied from CRM #[id number]. Topic: Referral - Referrals/Orders [place] >> Nov 15, 2023  4:40 PM Patryce P wrote: Jonathandavid, Marlett is calling other request    Include all details related to the request(s) below: Patient is calling to request a referral for home health for skilled nursing to assist with his wound care and catheterization.  [facility] Phone number - [phone]  Please advise  Confirm and type the Best Contact Number below:  Patient/caller contact number:  [phone]           [] Home  [x] Mobile  [] Work [] Other   [x] Okay to leave a voicemail   Medication List:  Current Outpatient Medications:  .  midodrine  (PROAMATINE ) 5 mg tablet, Take 1 tablet (5 mg total) by mouth in the morning and 1 tablet (5 mg total) at noon and 1 tablet (5 mg total) in the evening. Take before meals., Disp: 90 tablet, Rfl: 0 .  multivit,tx with iron,minerals (multivitamin with minerals), Take 1 tablet by mouth daily., Disp: , Rfl:      Medication Request/Refills: Pharmacy Information (if applicable)   [] Not Applicable       []  Pharmacy listed  Send Medication Request to:                                                 [] Pharmacy not listed (added to pharmacy list in Epic) Send Medication Request to:      Listed Pharmacies: [id number] - [place], [place] - [address] AT [facility] OF [address] - PHONE: [phone] - FAX: [phone] [facility]

## 2023-11-15 NOTE — Telephone Encounter (Signed)
 Referral sent

## 2023-11-22 DIAGNOSIS — Z419 Encounter for procedure for purposes other than remedying health state, unspecified: Secondary | ICD-10-CM | POA: Diagnosis not present

## 2023-11-28 ENCOUNTER — Encounter: Payer: Self-pay | Admitting: Plastic Surgery

## 2023-11-28 ENCOUNTER — Ambulatory Visit (INDEPENDENT_AMBULATORY_CARE_PROVIDER_SITE_OTHER): Admitting: Plastic Surgery

## 2023-11-28 VITALS — BP 92/57 | HR 122

## 2023-11-28 DIAGNOSIS — L89220 Pressure ulcer of left hip, unstageable: Secondary | ICD-10-CM

## 2023-11-28 NOTE — Progress Notes (Signed)
 Mr. Urwin returns today to discuss further management of the pressure ulcer on his left hip.  He underwent a nephrectomy in August secondary to a large staghorn calculus.  He is doing well from that is had all sutures and his drain removed.  Since surgery he has been persistently anemic and tachycardic.  He has seen a cardiologist for the tachycardia.  He and his mother states that he has been working very diligently on increasing protein intake to help with the anemia.  He has added seafood and eggs to his diet.  Pressure ulcer left hip: Patient will ultimately require an advancement flap to close the wound.  He understands that we will need to get the final results from his cardiac monitor and clearance from his cardiologist to proceed with surgery.  In the meantime I have asked him to contact his primary care doctor to work on increasing his hemoglobin as close to 10 as possible and to work on nutritional support.  The long-term healing of his flap will depend on the quality of his nutritional status at the time of surgery.  He will return to see me for surgical planning once he has begun working with his primary care doctor and once the results of his cardiac evaluation are complete and we have cardiac clearance.

## 2023-12-02 DIAGNOSIS — N319 Neuromuscular dysfunction of bladder, unspecified: Secondary | ICD-10-CM | POA: Diagnosis not present

## 2023-12-02 DIAGNOSIS — R3981 Functional urinary incontinence: Secondary | ICD-10-CM | POA: Diagnosis not present

## 2023-12-02 DIAGNOSIS — G8220 Paraplegia, unspecified: Secondary | ICD-10-CM | POA: Diagnosis not present

## 2023-12-02 DIAGNOSIS — R32 Unspecified urinary incontinence: Secondary | ICD-10-CM | POA: Diagnosis not present

## 2023-12-02 DIAGNOSIS — G8222 Paraplegia, incomplete: Secondary | ICD-10-CM | POA: Diagnosis not present

## 2023-12-03 DIAGNOSIS — T8189XA Other complications of procedures, not elsewhere classified, initial encounter: Secondary | ICD-10-CM | POA: Diagnosis not present

## 2023-12-16 ENCOUNTER — Telehealth: Payer: Self-pay | Admitting: Plastic Surgery

## 2023-12-16 NOTE — Telephone Encounter (Signed)
 Pts mom called and stated her son had blood work done with [facility] and wanted to know if Dr Waddell could see it or does she need to request it to be sent to us . I let her know that Dr Waddell was out of the office. Please advise when back in office

## 2023-12-17 ENCOUNTER — Telehealth: Payer: Self-pay

## 2023-12-17 NOTE — Telephone Encounter (Signed)
 Surgical Clearance sent to [facility] PA-C [phone]). Confirmation of receipt.

## 2023-12-25 ENCOUNTER — Ambulatory Visit: Admitting: Plastic Surgery

## 2023-12-25 ENCOUNTER — Telehealth: Payer: Self-pay

## 2023-12-25 NOTE — Telephone Encounter (Signed)
 I spoke to the patient about his appointment today. Dr. Waddell said he is welcome to come in if he needs the dressings changed, but we do not have any new information to give him today. We are waiting on cardiology clearance and we still need his hemoglobin to come up to 10. Patient expressed understanding and decided they will not come in today. I informed Dr. Waddell and we will reach out to cardiologist for the clearance.

## 2023-12-27 DIAGNOSIS — R32 Unspecified urinary incontinence: Secondary | ICD-10-CM | POA: Diagnosis not present

## 2023-12-30 ENCOUNTER — Telehealth: Payer: Self-pay | Admitting: Plastic Surgery

## 2023-12-30 NOTE — Telephone Encounter (Signed)
 Called pt back, per Dr Waddell we can hold off on making an apt until we have the sx clearance back. I also spoke with Isaiah concerning an apt with Dr Waddell as well

## 2023-12-30 NOTE — Telephone Encounter (Signed)
 Pt called and asked if he can be scheduled for surgery yet. I let him know I would send a message to the provider  I dont see a sx cl sent on onbase or or uploaded in the chart. Please advise

## 2024-01-01 ENCOUNTER — Encounter: Payer: Self-pay | Admitting: Family

## 2024-01-01 ENCOUNTER — Ambulatory Visit: Admitting: Family

## 2024-01-01 ENCOUNTER — Ambulatory Visit (INDEPENDENT_AMBULATORY_CARE_PROVIDER_SITE_OTHER): Admitting: Family

## 2024-01-01 DIAGNOSIS — L89313 Pressure ulcer of right buttock, stage 3: Secondary | ICD-10-CM | POA: Diagnosis not present
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
## 2024-01-02 DIAGNOSIS — N319 Neuromuscular dysfunction of bladder, unspecified: Secondary | ICD-10-CM | POA: Diagnosis not present

## 2024-01-02 DIAGNOSIS — R32 Unspecified urinary incontinence: Secondary | ICD-10-CM | POA: Diagnosis not present

## 2024-01-02 DIAGNOSIS — T8189XA Other complications of procedures, not elsewhere classified, initial encounter: Secondary | ICD-10-CM | POA: Diagnosis not present

## 2024-01-02 DIAGNOSIS — G8222 Paraplegia, incomplete: Secondary | ICD-10-CM | POA: Diagnosis not present

## 2024-01-02 DIAGNOSIS — R339 Retention of urine, unspecified: Secondary | ICD-10-CM | POA: Diagnosis not present

## 2024-01-03 ENCOUNTER — Telehealth: Payer: Self-pay | Admitting: Plastic Surgery

## 2024-01-03 NOTE — Telephone Encounter (Signed)
 Mother called and asked if he will be getting scheduled for surgery. Last tele note stated a cardio clearance was needed. Please advise, that was back on 12-30-23

## 2024-01-10 DIAGNOSIS — G8220 Paraplegia, unspecified: Secondary | ICD-10-CM | POA: Diagnosis not present

## 2024-01-10 DIAGNOSIS — L89224 Pressure ulcer of left hip, stage 4: Secondary | ICD-10-CM | POA: Diagnosis not present

## 2024-01-10 DIAGNOSIS — D649 Anemia, unspecified: Secondary | ICD-10-CM | POA: Diagnosis not present

## 2024-01-10 DIAGNOSIS — L89314 Pressure ulcer of right buttock, stage 4: Secondary | ICD-10-CM | POA: Diagnosis not present

## 2024-01-13 ENCOUNTER — Encounter: Payer: Self-pay | Admitting: Radiology

## 2024-01-13 ENCOUNTER — Telehealth: Payer: Self-pay | Admitting: Plastic Surgery

## 2024-01-13 NOTE — Telephone Encounter (Signed)
 Pt called asking about surgery, he wanted to know if we rec his cardio clearance, there is a [facility] cardio clearance in pt chart as of today, please review

## 2024-01-13 NOTE — Telephone Encounter (Signed)
 Mother called again to inquire about moving forward with surgery

## 2024-01-15 ENCOUNTER — Ambulatory Visit (INDEPENDENT_AMBULATORY_CARE_PROVIDER_SITE_OTHER): Admitting: Plastic Surgery

## 2024-01-15 VITALS — BP 109/65 | HR 98

## 2024-01-15 DIAGNOSIS — L89220 Pressure ulcer of left hip, unstageable: Secondary | ICD-10-CM | POA: Diagnosis not present

## 2024-01-15 NOTE — Progress Notes (Signed)
 Lee Reilly returns today.  He has received cardiac clearance and his hemoglobin is now 9.  While I would like it higher I believe this is probably as high as it we will get.  He has been working diligently on increasing protein in his diet.  His left hip wound is significantly improved it is clean and granulating.  Unfortunately he has developed an ischial pressure sore on the right probably due to his seeding.  He is working with another wound care center on this.  We discussed advancement flap for the left trochanteric wound.  I believe he is probably as optimized as he can be.  Will begin work on approval and scheduling him for a tensor fascia lata flap or a vita Y advancement flap.  He will require 1 night in the hospital after the procedure is done.

## 2024-01-22 ENCOUNTER — Encounter (HOSPITAL_BASED_OUTPATIENT_CLINIC_OR_DEPARTMENT_OTHER): Admitting: General Surgery

## 2024-01-29 DIAGNOSIS — R339 Retention of urine, unspecified: Secondary | ICD-10-CM | POA: Diagnosis not present

## 2024-01-30 DIAGNOSIS — L89150 Pressure ulcer of sacral region, unstageable: Secondary | ICD-10-CM | POA: Diagnosis not present

## 2024-01-30 DIAGNOSIS — L89224 Pressure ulcer of left hip, stage 4: Secondary | ICD-10-CM | POA: Diagnosis not present

## 2024-01-30 DIAGNOSIS — L89314 Pressure ulcer of right buttock, stage 4: Secondary | ICD-10-CM | POA: Diagnosis not present

## 2024-02-01 DIAGNOSIS — R3981 Functional urinary incontinence: Secondary | ICD-10-CM | POA: Diagnosis not present

## 2024-02-01 DIAGNOSIS — G8220 Paraplegia, unspecified: Secondary | ICD-10-CM | POA: Diagnosis not present

## 2024-02-01 DIAGNOSIS — T8189XA Other complications of procedures, not elsewhere classified, initial encounter: Secondary | ICD-10-CM | POA: Diagnosis not present

## 2024-02-02 DIAGNOSIS — G8222 Paraplegia, incomplete: Secondary | ICD-10-CM | POA: Diagnosis not present

## 2024-02-02 DIAGNOSIS — N319 Neuromuscular dysfunction of bladder, unspecified: Secondary | ICD-10-CM | POA: Diagnosis not present

## 2024-02-02 DIAGNOSIS — R32 Unspecified urinary incontinence: Secondary | ICD-10-CM | POA: Diagnosis not present

## 2024-02-21 DIAGNOSIS — Z419 Encounter for procedure for purposes other than remedying health state, unspecified: Secondary | ICD-10-CM | POA: Diagnosis not present

## 2024-02-23 DIAGNOSIS — M4628 Osteomyelitis of vertebra, sacral and sacrococcygeal region: Secondary | ICD-10-CM | POA: Diagnosis not present

## 2024-02-23 DIAGNOSIS — R7881 Bacteremia: Secondary | ICD-10-CM | POA: Diagnosis not present

## 2024-02-23 DIAGNOSIS — E440 Moderate protein-calorie malnutrition: Secondary | ICD-10-CM | POA: Diagnosis not present

## 2024-02-25 DIAGNOSIS — R000 Tachycardia, unspecified: Secondary | ICD-10-CM | POA: Diagnosis not present

## 2024-03-04 DIAGNOSIS — R32 Unspecified urinary incontinence: Secondary | ICD-10-CM | POA: Diagnosis not present

## 2024-03-05 DIAGNOSIS — R7881 Bacteremia: Secondary | ICD-10-CM | POA: Diagnosis not present

## 2024-03-05 DIAGNOSIS — E440 Moderate protein-calorie malnutrition: Secondary | ICD-10-CM | POA: Diagnosis not present

## 2024-03-05 DIAGNOSIS — M4628 Osteomyelitis of vertebra, sacral and sacrococcygeal region: Secondary | ICD-10-CM | POA: Diagnosis not present

## 2024-03-06 DIAGNOSIS — M4628 Osteomyelitis of vertebra, sacral and sacrococcygeal region: Secondary | ICD-10-CM | POA: Diagnosis not present

## 2024-03-06 DIAGNOSIS — R7881 Bacteremia: Secondary | ICD-10-CM | POA: Diagnosis not present

## 2024-03-06 DIAGNOSIS — E440 Moderate protein-calorie malnutrition: Secondary | ICD-10-CM | POA: Diagnosis not present

## 2024-03-07 DIAGNOSIS — M4628 Osteomyelitis of vertebra, sacral and sacrococcygeal region: Secondary | ICD-10-CM | POA: Diagnosis not present

## 2024-03-07 DIAGNOSIS — E440 Moderate protein-calorie malnutrition: Secondary | ICD-10-CM | POA: Diagnosis not present

## 2024-03-07 DIAGNOSIS — R7881 Bacteremia: Secondary | ICD-10-CM | POA: Diagnosis not present

## 2024-03-08 DIAGNOSIS — M4628 Osteomyelitis of vertebra, sacral and sacrococcygeal region: Secondary | ICD-10-CM | POA: Diagnosis not present

## 2024-03-08 DIAGNOSIS — E440 Moderate protein-calorie malnutrition: Secondary | ICD-10-CM | POA: Diagnosis not present

## 2024-03-08 DIAGNOSIS — R7881 Bacteremia: Secondary | ICD-10-CM | POA: Diagnosis not present

## 2024-03-11 ENCOUNTER — Encounter: Admitting: Physician Assistant

## 2024-03-16 ENCOUNTER — Encounter: Admitting: Physician Assistant
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
## 2024-03-18 ENCOUNTER — Encounter: Admitting: Physician Assistant

## 2024-03-19 DIAGNOSIS — M4628 Osteomyelitis of vertebra, sacral and sacrococcygeal region: Secondary | ICD-10-CM | POA: Diagnosis not present

## 2024-03-19 DIAGNOSIS — R7881 Bacteremia: Secondary | ICD-10-CM | POA: Diagnosis not present

## 2024-03-19 DIAGNOSIS — E440 Moderate protein-calorie malnutrition: Secondary | ICD-10-CM | POA: Diagnosis not present

## 2024-03-20 DIAGNOSIS — R7881 Bacteremia: Secondary | ICD-10-CM | POA: Diagnosis not present

## 2024-03-20 DIAGNOSIS — E440 Moderate protein-calorie malnutrition: Secondary | ICD-10-CM | POA: Diagnosis not present

## 2024-03-20 DIAGNOSIS — M4628 Osteomyelitis of vertebra, sacral and sacrococcygeal region: Secondary | ICD-10-CM | POA: Diagnosis not present

## 2024-03-21 DIAGNOSIS — E440 Moderate protein-calorie malnutrition: Secondary | ICD-10-CM | POA: Diagnosis not present

## 2024-03-21 DIAGNOSIS — R7881 Bacteremia: Secondary | ICD-10-CM | POA: Diagnosis not present

## 2024-03-21 DIAGNOSIS — M4628 Osteomyelitis of vertebra, sacral and sacrococcygeal region: Secondary | ICD-10-CM | POA: Diagnosis not present

## 2024-03-22 DIAGNOSIS — R7881 Bacteremia: Secondary | ICD-10-CM | POA: Diagnosis not present

## 2024-03-22 DIAGNOSIS — M4628 Osteomyelitis of vertebra, sacral and sacrococcygeal region: Secondary | ICD-10-CM | POA: Diagnosis not present

## 2024-03-22 DIAGNOSIS — E440 Moderate protein-calorie malnutrition: Secondary | ICD-10-CM | POA: Diagnosis not present

## 2024-03-31 ENCOUNTER — Ambulatory Visit (HOSPITAL_COMMUNITY): Admit: 2024-03-31 | Admitting: Plastic Surgery

## 2024-03-31 SURGERY — REVISION, SCAR, USING Z-PLASTY TECHNIQUE
Anesthesia: Choice | Laterality: Left

## 2024-04-01 ENCOUNTER — Encounter: Admitting: Plastic Surgery

## 2024-04-08 ENCOUNTER — Encounter: Admitting: Plastic Surgery

## 2024-04-15 ENCOUNTER — Encounter: Admitting: Physician Assistant

## 2024-04-22 ENCOUNTER — Encounter: Admitting: Plastic Surgery

## 2024-04-29 ENCOUNTER — Encounter: Admitting: Physician Assistant

## 2024-05-06 ENCOUNTER — Encounter: Admitting: Plastic Surgery
